# Patient Record
Sex: Female | Born: 1955
Health system: Southern US, Community
[De-identification: ages and names within clinical notes are randomized; demographics above are authoritative.]

## PROBLEM LIST (undated history)

## (undated) DIAGNOSIS — C519 Malignant neoplasm of vulva, unspecified: Secondary | ICD-10-CM

## (undated) DIAGNOSIS — I2111 ST elevation (STEMI) myocardial infarction involving right coronary artery: Secondary | ICD-10-CM

## (undated) DIAGNOSIS — Z720 Tobacco use: Secondary | ICD-10-CM

## (undated) DIAGNOSIS — I2510 Atherosclerotic heart disease of native coronary artery without angina pectoris: Secondary | ICD-10-CM

## (undated) DIAGNOSIS — E119 Type 2 diabetes mellitus without complications: Secondary | ICD-10-CM

## (undated) DIAGNOSIS — I472 Ventricular tachycardia: Secondary | ICD-10-CM

## (undated) DIAGNOSIS — I4721 Torsades de pointes: Secondary | ICD-10-CM

## (undated) DIAGNOSIS — E782 Mixed hyperlipidemia: Secondary | ICD-10-CM

## (undated) DIAGNOSIS — I255 Ischemic cardiomyopathy: Secondary | ICD-10-CM

## (undated) HISTORY — DX: Type 2 diabetes mellitus without complications: E11.9

## (undated) HISTORY — PX: WISDOM TOOTH EXTRACTION: SHX21

---

## 1979-11-11 HISTORY — PX: TONSILLECTOMY: SUR1361

## 1984-11-10 HISTORY — PX: LAMINECTOMY: SHX219

## 2002-10-12 ENCOUNTER — Other Ambulatory Visit: Admission: RE | Admit: 2002-10-12 | Discharge: 2002-10-12 | Payer: Self-pay | Admitting: Internal Medicine

## 2008-01-21 ENCOUNTER — Ambulatory Visit: Payer: Self-pay | Admitting: Internal Medicine

## 2014-11-10 HISTORY — PX: VULVECTOMY: SHX1086

## 2015-06-14 ENCOUNTER — Encounter: Payer: Self-pay | Admitting: Family Medicine

## 2015-06-14 ENCOUNTER — Other Ambulatory Visit (HOSPITAL_COMMUNITY)
Admission: RE | Admit: 2015-06-14 | Discharge: 2015-06-14 | Disposition: A | Discharge status: Home / Self Care | Payer: Commercial Managed Care - PPO | Source: Ambulatory Visit | Attending: Family Medicine | Admitting: Family Medicine

## 2015-06-14 ENCOUNTER — Encounter (INDEPENDENT_AMBULATORY_CARE_PROVIDER_SITE_OTHER): Payer: Self-pay

## 2015-06-14 ENCOUNTER — Ambulatory Visit (INDEPENDENT_AMBULATORY_CARE_PROVIDER_SITE_OTHER): Payer: Commercial Managed Care - PPO | Admitting: Family Medicine

## 2015-06-14 VITALS — BP 106/64 | HR 82 | Temp 98.8°F | Ht 65.0 in | Wt 208.0 lb

## 2015-06-14 DIAGNOSIS — Z113 Encounter for screening for infections with a predominantly sexual mode of transmission: Secondary | ICD-10-CM | POA: Insufficient documentation

## 2015-06-14 DIAGNOSIS — Z1211 Encounter for screening for malignant neoplasm of colon: Secondary | ICD-10-CM | POA: Diagnosis not present

## 2015-06-14 DIAGNOSIS — E119 Type 2 diabetes mellitus without complications: Secondary | ICD-10-CM | POA: Diagnosis not present

## 2015-06-14 DIAGNOSIS — Z01419 Encounter for gynecological examination (general) (routine) without abnormal findings: Secondary | ICD-10-CM | POA: Diagnosis not present

## 2015-06-14 DIAGNOSIS — Z1239 Encounter for other screening for malignant neoplasm of breast: Secondary | ICD-10-CM | POA: Diagnosis not present

## 2015-06-14 DIAGNOSIS — Z1151 Encounter for screening for human papillomavirus (HPV): Secondary | ICD-10-CM | POA: Diagnosis present

## 2015-06-14 DIAGNOSIS — Z0000 Encounter for general adult medical examination without abnormal findings: Secondary | ICD-10-CM | POA: Diagnosis not present

## 2015-06-14 DIAGNOSIS — E118 Type 2 diabetes mellitus with unspecified complications: Secondary | ICD-10-CM | POA: Insufficient documentation

## 2015-06-14 LAB — CBC WITH DIFFERENTIAL/PLATELET
BASOS ABS: 0 10*3/uL (ref 0.0–0.1)
BASOS PCT: 0.5 % (ref 0.0–3.0)
EOS PCT: 0 % (ref 0.0–5.0)
Eosinophils Absolute: 0 10*3/uL (ref 0.0–0.7)
HCT: 45.4 % (ref 36.0–46.0)
Hemoglobin: 15.4 g/dL — ABNORMAL HIGH (ref 12.0–15.0)
LYMPHS PCT: 22.4 % (ref 12.0–46.0)
Lymphs Abs: 1.6 10*3/uL (ref 0.7–4.0)
MCHC: 33.8 g/dL (ref 30.0–36.0)
MCV: 89.3 fl (ref 78.0–100.0)
MONO ABS: 0.5 10*3/uL (ref 0.1–1.0)
MONOS PCT: 6.7 % (ref 3.0–12.0)
NEUTROS PCT: 70.4 % (ref 43.0–77.0)
Neutro Abs: 5.1 10*3/uL (ref 1.4–7.7)
Platelets: 203 10*3/uL (ref 150.0–400.0)
RBC: 5.08 Mil/uL (ref 3.87–5.11)
RDW: 13.8 % (ref 11.5–15.5)
WBC: 7.3 10*3/uL (ref 4.0–10.5)

## 2015-06-14 LAB — COMPREHENSIVE METABOLIC PANEL
ALBUMIN: 4.1 g/dL (ref 3.5–5.2)
ALT: 12 U/L (ref 0–35)
AST: 13 U/L (ref 0–37)
Alkaline Phosphatase: 75 U/L (ref 39–117)
BUN: 16 mg/dL (ref 6–23)
CO2: 31 meq/L (ref 19–32)
Calcium: 9.4 mg/dL (ref 8.4–10.5)
Chloride: 101 mEq/L (ref 96–112)
Creatinine, Ser: 0.71 mg/dL (ref 0.40–1.20)
GFR: 89.81 mL/min (ref >60.00)
GLUCOSE: 104 mg/dL — AB (ref 70–99)
Potassium: 4 mEq/L (ref 3.5–5.1)
Sodium: 137 mEq/L (ref 135–145)
TOTAL PROTEIN: 7 g/dL (ref 6.0–8.3)
Total Bilirubin: 0.8 mg/dL (ref 0.2–1.2)

## 2015-06-14 LAB — HEMOGLOBIN A1C: Hgb A1c MFr Bld: 6.3 % (ref 4.6–6.5)

## 2015-06-14 LAB — LIPID PANEL
Cholesterol: 207 mg/dL — ABNORMAL HIGH (ref 0–200)
HDL: 57.8 mg/dL (ref >39.00)
LDL CALC: 138 mg/dL — AB (ref 0–99)
NonHDL: 149.05
Total CHOL/HDL Ratio: 4
Triglycerides: 54 mg/dL (ref 0.0–149.0)
VLDL: 10.8 mg/dL (ref 0.0–40.0)

## 2015-06-14 LAB — TSH: TSH: 1.3 u[IU]/mL (ref 0.35–4.50)

## 2015-06-14 NOTE — Addendum Note (Signed)
Addended by: Ellamae Sia on: 06/14/2015 12:14 PM   Modules accepted: Orders

## 2015-06-14 NOTE — Addendum Note (Signed)
Addended by: Modena Nunnery on: 06/14/2015 11:38 AM   Modules accepted: Orders

## 2015-06-14 NOTE — Patient Instructions (Signed)
Great to see you. Please come back for you pap smear.  I will call you with your results.  Call to set up your mammogram.

## 2015-06-14 NOTE — Assessment & Plan Note (Signed)
Per pt, has been diet controlled. Check a1c today to assess control.

## 2015-06-14 NOTE — Progress Notes (Signed)
Pre visit review using our clinic review tool, if applicable. No additional management support is needed unless otherwise documented below in the visit note. 

## 2015-06-14 NOTE — Assessment & Plan Note (Signed)
Reviewed preventive care protocols, scheduled due services, and updated immunizations Discussed nutrition, exercise, diet, and healthy lifestyle.  Pap smear today.  Mammogram ordered.  Pt to call to schedule- phone number given to her today.  Declines colonoscopy, agrees to IFOB.  Orders Placed This Encounter  Procedures  . Fecal occult blood, imunochemical  . MM Digital Screening  . CBC with Differential/Platelet  . Comprehensive metabolic panel  . Lipid panel  . TSH  . Hemoglobin A1c

## 2015-06-14 NOTE — Progress Notes (Signed)
Subjective:   Patient ID: Karen Hartman, female    DOB: 1956/09/09, 59 y.o.   MRN: [id number]  Karen Hartman is a pleasant 59 y.o. year old female who presents to clinic today with Establish Care  on 06/14/2015  HPI:  G2 P1 here to establish care and for CPX. Last pap smear was 12 years ago.  No h/o postmenopausal bleeding. Never had a colonoscopy.  Has not any blood in her stool or changes in her bowel habits.  Last mammogram was "a couple of years ago."  Diabetes- diet controlled after losing 75 pounds with diet.  Strong FH of diabetes.  No current outpatient prescriptions on file prior to visit.   No current facility-administered medications on file prior to visit.    No Known Allergies  Past Medical History  Diagnosis Date  . Diabetes     Past Surgical History  Procedure Laterality Date  . Tonsillectomy  1981  . Cesarean section    . Laminectomy  1986  . Wisdom tooth extraction      Family History  Problem Relation Age of Onset  . Uterine cancer Mother   . Heart disease Mother     History   Social History  . Marital Status: Married    Spouse Name: N/A  . Number of Children: N/A  . Years of Education: N/A   Occupational History  . Not on file.   Social History Main Topics  . Smoking status: Current Every Day Smoker  . Smokeless tobacco: Never Used  . Alcohol Use: No  . Drug Use: No  . Sexual Activity: Yes   Other Topics Concern  . Not on file   Social History Narrative  . No narrative on file   The PMH, PSH, Social History, Family History, Medications, and allergies have been reviewed in [facility], and have been updated if relevant.   Review of Systems  Constitutional: Negative.   HENT: Negative.   Eyes: Negative.   Respiratory: Negative.   Gastrointestinal: Negative.   Endocrine: Negative.   Genitourinary: Negative.   Musculoskeletal: Negative.   Skin: Negative.   Allergic/Immunologic: Negative.   Neurological: Negative.     Hematological: Negative.   Psychiatric/Behavioral: Negative.   All other systems reviewed and are negative.      Objective:    BP 106/64 mmHg  Pulse 82  Temp(Src) 98.8 F (37.1 C) (Oral)  Ht 5\' 5"  (1.651 m)  Wt 208 lb (94.348 kg)  BMI 34.61 kg/m2  SpO2 95%   Physical Exam    General:  Well-developed,well-nourished,in no acute distress; alert,appropriate and cooperative throughout examination Head:  normocephalic and atraumatic.   Eyes:  vision grossly intact, pupils equal, pupils round, and pupils reactive to light.   Ears:  R ear normal and L ear normal.   Nose:  no external deformity.   Mouth:  good dentition.   Neck:  No deformities, masses, or tenderness noted. Breasts:  No mass, nodules, thickening, tenderness, bulging, retraction, inflamation, nipple discharge or skin changes noted.   Lungs:  Normal respiratory effort, chest expands symmetrically. Lungs are clear to auscultation, no crackles or wheezes. Heart:  Normal rate and regular rhythm. S1 and S2 normal without gallop, murmur, click, rub or other extra sounds. Abdomen:  Bowel sounds positive,abdomen soft and non-tender without masses, organomegaly or hernias noted. Rectal:  no external abnormalities.   Genitalia:  Pelvic Exam:        External: normal female genitalia without lesions or masses  Vagina: normal without lesions or masses        Cervix: normal without lesions or masses        Adnexa: normal bimanual exam without masses or fullness        Uterus: normal by palpation        Pap smear: performed Msk:  No deformity or scoliosis noted of thoracic or lumbar spine.   Extremities:  No clubbing, cyanosis, edema, or deformity noted with normal full range of motion of all joints.   Neurologic:  alert & oriented X3 and gait normal.   Skin:  Intact without suspicious lesions or rashes Cervical Nodes:  No lymphadenopathy noted Axillary Nodes:  No palpable lymphadenopathy Psych:  Cognition and judgment  appear intact. Alert and cooperative with normal attention span and concentration. No apparent delusions, illusions, hallucinations        Assessment & Plan:   Well woman exam - Plan: CBC with Differential/Platelet, Comprehensive metabolic panel, Lipid panel, TSH  Type 2 diabetes mellitus without complication - Plan: Hemoglobin A1c  Special screening for malignant neoplasms, colon - Plan: Fecal occult blood, imunochemical  Screening for breast cancer - Plan: MM Digital Screening No Follow-up on file.

## 2015-06-15 ENCOUNTER — Encounter: Payer: Self-pay | Admitting: *Deleted

## 2015-06-15 LAB — CYTOLOGY - PAP

## 2015-06-17 LAB — CERVICOVAGINAL ANCILLARY ONLY
Bacterial vaginitis: NEGATIVE
Candida vaginitis: NEGATIVE

## 2015-06-18 LAB — CERVICOVAGINAL ANCILLARY ONLY: Herpes: NEGATIVE

## 2015-10-09 DIAGNOSIS — C519 Malignant neoplasm of vulva, unspecified: Secondary | ICD-10-CM | POA: Insufficient documentation

## 2015-10-09 DIAGNOSIS — Z8544 Personal history of malignant neoplasm of other female genital organs: Secondary | ICD-10-CM | POA: Insufficient documentation

## 2015-11-09 DIAGNOSIS — T8149XA Infection following a procedure, other surgical site, initial encounter: Secondary | ICD-10-CM | POA: Insufficient documentation

## 2016-11-01 ENCOUNTER — Encounter (HOSPITAL_COMMUNITY)
Admission: EM | Disposition: A | Discharge status: Home / Self Care | Payer: Self-pay | Source: Home / Self Care | Attending: Cardiovascular Disease

## 2016-11-01 ENCOUNTER — Emergency Department (HOSPITAL_COMMUNITY): Payer: Managed Care, Other (non HMO)

## 2016-11-01 ENCOUNTER — Encounter (HOSPITAL_COMMUNITY): Payer: Self-pay

## 2016-11-01 ENCOUNTER — Inpatient Hospital Stay (HOSPITAL_COMMUNITY)
Admission: EM | Admit: 2016-11-01 | Discharge: 2016-11-05 | DRG: 246 | Disposition: A | Discharge status: Home / Self Care | Payer: Managed Care, Other (non HMO) | Attending: Cardiovascular Disease | Admitting: Cardiovascular Disease

## 2016-11-01 DIAGNOSIS — I4721 Torsades de pointes: Secondary | ICD-10-CM

## 2016-11-01 DIAGNOSIS — I25118 Atherosclerotic heart disease of native coronary artery with other forms of angina pectoris: Secondary | ICD-10-CM

## 2016-11-01 DIAGNOSIS — I2510 Atherosclerotic heart disease of native coronary artery without angina pectoris: Secondary | ICD-10-CM | POA: Diagnosis present

## 2016-11-01 DIAGNOSIS — I5021 Acute systolic (congestive) heart failure: Secondary | ICD-10-CM | POA: Diagnosis present

## 2016-11-01 DIAGNOSIS — F17210 Nicotine dependence, cigarettes, uncomplicated: Secondary | ICD-10-CM | POA: Diagnosis present

## 2016-11-01 DIAGNOSIS — Y831 Surgical operation with implant of artificial internal device as the cause of abnormal reaction of the patient, or of later complication, without mention of misadventure at the time of the procedure: Secondary | ICD-10-CM | POA: Diagnosis not present

## 2016-11-01 DIAGNOSIS — I255 Ischemic cardiomyopathy: Secondary | ICD-10-CM | POA: Diagnosis present

## 2016-11-01 DIAGNOSIS — I472 Ventricular tachycardia: Secondary | ICD-10-CM | POA: Diagnosis present

## 2016-11-01 DIAGNOSIS — Z8249 Family history of ischemic heart disease and other diseases of the circulatory system: Secondary | ICD-10-CM

## 2016-11-01 DIAGNOSIS — Z6834 Body mass index (BMI) 34.0-34.9, adult: Secondary | ICD-10-CM

## 2016-11-01 DIAGNOSIS — Z6841 Body Mass Index (BMI) 40.0 and over, adult: Secondary | ICD-10-CM

## 2016-11-01 DIAGNOSIS — Z981 Arthrodesis status: Secondary | ICD-10-CM

## 2016-11-01 DIAGNOSIS — I213 ST elevation (STEMI) myocardial infarction of unspecified site: Secondary | ICD-10-CM | POA: Diagnosis present

## 2016-11-01 DIAGNOSIS — R0789 Other chest pain: Secondary | ICD-10-CM | POA: Diagnosis present

## 2016-11-01 DIAGNOSIS — I2111 ST elevation (STEMI) myocardial infarction involving right coronary artery: Secondary | ICD-10-CM | POA: Diagnosis present

## 2016-11-01 DIAGNOSIS — I110 Hypertensive heart disease with heart failure: Secondary | ICD-10-CM | POA: Diagnosis present

## 2016-11-01 DIAGNOSIS — Z8544 Personal history of malignant neoplasm of other female genital organs: Secondary | ICD-10-CM | POA: Diagnosis not present

## 2016-11-01 DIAGNOSIS — Z8049 Family history of malignant neoplasm of other genital organs: Secondary | ICD-10-CM | POA: Diagnosis not present

## 2016-11-01 DIAGNOSIS — Z452 Encounter for adjustment and management of vascular access device: Secondary | ICD-10-CM

## 2016-11-01 DIAGNOSIS — I493 Ventricular premature depolarization: Secondary | ICD-10-CM | POA: Diagnosis present

## 2016-11-01 DIAGNOSIS — Z955 Presence of coronary angioplasty implant and graft: Secondary | ICD-10-CM

## 2016-11-01 DIAGNOSIS — T82867A Thrombosis of cardiac prosthetic devices, implants and grafts, initial encounter: Secondary | ICD-10-CM | POA: Diagnosis not present

## 2016-11-01 DIAGNOSIS — I4729 Other ventricular tachycardia: Secondary | ICD-10-CM

## 2016-11-01 DIAGNOSIS — I10 Essential (primary) hypertension: Secondary | ICD-10-CM | POA: Diagnosis not present

## 2016-11-01 DIAGNOSIS — E782 Mixed hyperlipidemia: Secondary | ICD-10-CM | POA: Diagnosis present

## 2016-11-01 DIAGNOSIS — E119 Type 2 diabetes mellitus without complications: Secondary | ICD-10-CM | POA: Diagnosis present

## 2016-11-01 DIAGNOSIS — Y92230 Patient room in hospital as the place of occurrence of the external cause: Secondary | ICD-10-CM | POA: Diagnosis not present

## 2016-11-01 DIAGNOSIS — I340 Nonrheumatic mitral (valve) insufficiency: Secondary | ICD-10-CM | POA: Diagnosis present

## 2016-11-01 DIAGNOSIS — E118 Type 2 diabetes mellitus with unspecified complications: Secondary | ICD-10-CM

## 2016-11-01 HISTORY — PX: TEMPORARY PACEMAKER: CATH118268

## 2016-11-01 HISTORY — DX: Ischemic cardiomyopathy: I25.5

## 2016-11-01 HISTORY — DX: Ventricular tachycardia: I47.2

## 2016-11-01 HISTORY — DX: ST elevation (STEMI) myocardial infarction involving right coronary artery: I21.11

## 2016-11-01 HISTORY — PX: CARDIAC CATHETERIZATION: SHX172

## 2016-11-01 HISTORY — DX: Morbid (severe) obesity due to excess calories: E66.01

## 2016-11-01 HISTORY — DX: Torsades de pointes: I47.21

## 2016-11-01 HISTORY — DX: Mixed hyperlipidemia: E78.2

## 2016-11-01 HISTORY — DX: Malignant neoplasm of vulva, unspecified: C51.9

## 2016-11-01 HISTORY — PX: INTRAVASCULAR ULTRASOUND/IVUS: CATH118244

## 2016-11-01 HISTORY — DX: ST elevation (STEMI) myocardial infarction of unspecified site: I21.3

## 2016-11-01 HISTORY — DX: Tobacco use: Z72.0

## 2016-11-01 HISTORY — PX: LEFT HEART CATH AND CORONARY ANGIOGRAPHY: CATH118249

## 2016-11-01 HISTORY — PX: CORONARY THROMBECTOMY: CATH118304

## 2016-11-01 HISTORY — DX: Atherosclerotic heart disease of native coronary artery without angina pectoris: I25.10

## 2016-11-01 LAB — COMPREHENSIVE METABOLIC PANEL
ALT: UNDETERMINED U/L (ref 14–54)
AST: 31 U/L (ref 15–41)
Albumin: 4.1 g/dL (ref 3.5–5.0)
Alkaline Phosphatase: 69 U/L (ref 38–126)
Anion gap: 16 — ABNORMAL HIGH (ref 5–15)
BILIRUBIN TOTAL: UNDETERMINED mg/dL (ref 0.3–1.2)
BUN: 16 mg/dL (ref 6–20)
CO2: 21 mmol/L — ABNORMAL LOW (ref 22–32)
CREATININE: 0.86 mg/dL (ref 0.44–1.00)
Calcium: 10.4 mg/dL — ABNORMAL HIGH (ref 8.9–10.3)
Chloride: 102 mmol/L (ref 101–111)
GFR calc Af Amer: >60 mL/min (ref >60)
GLUCOSE: 228 mg/dL — AB (ref 65–99)
Potassium: 4.4 mmol/L (ref 3.5–5.1)
Sodium: 139 mmol/L (ref 135–145)
TOTAL PROTEIN: 6.9 g/dL (ref 6.5–8.1)

## 2016-11-01 LAB — PROTIME-INR
INR: 0.93
PROTHROMBIN TIME: 12.4 s (ref 11.4–15.2)

## 2016-11-01 LAB — LIPID PANEL
Cholesterol: 236 mg/dL — ABNORMAL HIGH (ref 0–200)
HDL: 56 mg/dL (ref >40)
LDL CALC: 160 mg/dL — AB (ref 0–99)
TRIGLYCERIDES: 98 mg/dL (ref <150)
Total CHOL/HDL Ratio: 4.2 RATIO
VLDL: 20 mg/dL (ref 0–40)

## 2016-11-01 LAB — DIFFERENTIAL
BASOS ABS: 0 10*3/uL (ref 0.0–0.1)
Basophils Relative: 0 %
EOS ABS: 0 10*3/uL (ref 0.0–0.7)
Eosinophils Relative: 0 %
LYMPHS ABS: 1.8 10*3/uL (ref 0.7–4.0)
LYMPHS PCT: 30 %
Monocytes Absolute: 0.3 10*3/uL (ref 0.1–1.0)
Monocytes Relative: 5 %
NEUTROS PCT: 65 %
Neutro Abs: 3.8 10*3/uL (ref 1.7–7.7)

## 2016-11-01 LAB — CBC
HCT: 46.1 % — ABNORMAL HIGH (ref 36.0–46.0)
HEMOGLOBIN: 16.1 g/dL — AB (ref 12.0–15.0)
MCH: 30.7 pg (ref 26.0–34.0)
MCHC: 34.9 g/dL (ref 30.0–36.0)
MCV: 88 fL (ref 78.0–100.0)
PLATELETS: 230 10*3/uL (ref 150–400)
RBC: 5.24 MIL/uL — AB (ref 3.87–5.11)
RDW: 12.5 % (ref 11.5–15.5)
WBC: 5.9 10*3/uL (ref 4.0–10.5)

## 2016-11-01 LAB — BRAIN NATRIURETIC PEPTIDE: B Natriuretic Peptide: 92 pg/mL (ref 0.0–100.0)

## 2016-11-01 LAB — TSH: TSH: 2.608 u[IU]/mL (ref 0.350–4.500)

## 2016-11-01 LAB — GLUCOSE, CAPILLARY
GLUCOSE-CAPILLARY: 196 mg/dL — AB (ref 65–99)
GLUCOSE-CAPILLARY: 171 mg/dL — AB (ref 65–99)

## 2016-11-01 LAB — APTT: APTT: 23 s — AB (ref 24–36)

## 2016-11-01 LAB — TROPONIN I
TROPONIN I: 0.94 ng/mL — AB (ref <0.03)
Troponin I: 20.98 ng/mL (ref <0.03)
Troponin I: 29.65 ng/mL (ref <0.03)

## 2016-11-01 LAB — MRSA PCR SCREENING: MRSA by PCR: NEGATIVE

## 2016-11-01 SURGERY — LEFT HEART CATH AND CORONARY ANGIOGRAPHY
Anesthesia: LOCAL

## 2016-11-01 SURGERY — LEFT HEART CATH AND CORONARY ANGIOGRAPHY

## 2016-11-01 MED ORDER — LABETALOL HCL 5 MG/ML IV SOLN: 10.0000 mg | INTRAVENOUS | Status: AC | PRN

## 2016-11-01 MED ORDER — TICAGRELOR 90 MG PO TABS
ORAL_TABLET | ORAL | Status: AC
  Filled 2016-11-01: qty 1

## 2016-11-01 MED ORDER — LISINOPRIL 2.5 MG PO TABS
2.5000 mg | ORAL_TABLET | Freq: Every day | ORAL | Status: DC
  Administered 2016-11-01 – 2016-11-02 (×2): 2.5 mg via ORAL
  Filled 2016-11-01 (×2): qty 1

## 2016-11-01 MED ORDER — HYDRALAZINE HCL 20 MG/ML IJ SOLN: 5.0000 mg | INTRAMUSCULAR | Status: AC | PRN

## 2016-11-01 MED ORDER — HEPARIN (PORCINE) IN NACL 2-0.9 UNIT/ML-% IJ SOLN
INTRAMUSCULAR | Status: DC | PRN
  Administered 2016-11-01: 1500 mL via INTRA_ARTERIAL

## 2016-11-01 MED ORDER — MIDAZOLAM HCL 2 MG/2ML IJ SOLN
INTRAMUSCULAR | Status: DC | PRN
  Administered 2016-11-01: 1 mg via INTRAVENOUS

## 2016-11-01 MED ORDER — ONDANSETRON HCL 4 MG/2ML IJ SOLN
4.0000 mg | Freq: Once | INTRAMUSCULAR | Status: AC
  Administered 2016-11-01: 4 mg via INTRAVENOUS
  Filled 2016-11-01: qty 2

## 2016-11-01 MED ORDER — TIROFIBAN (AGGRASTAT) BOLUS VIA INFUSION
25.0000 ug/kg | Freq: Once | INTRAVENOUS | Status: AC
  Administered 2016-11-01: 2495 ug via INTRAVENOUS
  Filled 2016-11-01: qty 50

## 2016-11-01 MED ORDER — DIAZEPAM 5 MG PO TABS: 5.0000 mg | ORAL_TABLET | Freq: Four times a day (QID) | ORAL | Status: DC | PRN

## 2016-11-01 MED ORDER — OXYCODONE-ACETAMINOPHEN 5-325 MG PO TABS: 1.0000 | ORAL_TABLET | ORAL | Status: DC | PRN

## 2016-11-01 MED ORDER — MORPHINE SULFATE (PF) 4 MG/ML IV SOLN
4.0000 mg | Freq: Once | INTRAVENOUS | Status: AC
  Administered 2016-11-01: 4 mg via INTRAVENOUS
  Filled 2016-11-01: qty 1

## 2016-11-01 MED ORDER — PROMETHAZINE HCL 25 MG/ML IJ SOLN
INTRAMUSCULAR | Status: DC | PRN
Start: 2016-11-01 — End: 2016-11-01
  Administered 2016-11-01: 12.5 mg via INTRAVENOUS

## 2016-11-01 MED ORDER — SODIUM CHLORIDE 0.9 % IV SOLN
INTRAVENOUS | Status: DC | PRN
  Administered 2016-11-01: 100 mL/h via INTRAVENOUS
  Administered 2016-11-01: 250 mL via INTRAVENOUS

## 2016-11-01 MED ORDER — TIROFIBAN HCL IN NACL 5-0.9 MG/100ML-% IV SOLN
0.1500 ug/kg/min | INTRAVENOUS | Status: AC
  Administered 2016-11-01 – 2016-11-02 (×2): 0.15 ug/kg/min via INTRAVENOUS
  Filled 2016-11-01: qty 100

## 2016-11-01 MED ORDER — BIVALIRUDIN BOLUS VIA INFUSION - CUPID
INTRAVENOUS | Status: DC | PRN
  Administered 2016-11-01: 74.85 mg via INTRAVENOUS

## 2016-11-01 MED ORDER — ASPIRIN EC 81 MG PO TBEC
81.0000 mg | DELAYED_RELEASE_TABLET | Freq: Every day | ORAL | Status: DC
Start: 2016-11-02 — End: 2016-11-05
  Administered 2016-11-02 – 2016-11-05 (×4): 81 mg via ORAL
  Filled 2016-11-01 (×4): qty 1

## 2016-11-01 MED ORDER — SODIUM CHLORIDE 0.9% FLUSH
3.0000 mL | Freq: Two times a day (BID) | INTRAVENOUS | Status: DC
  Administered 2016-11-01 – 2016-11-04 (×4): 3 mL via INTRAVENOUS
  Administered 2016-11-04: 10 mL via INTRAVENOUS

## 2016-11-01 MED ORDER — IOPAMIDOL (ISOVUE-370) INJECTION 76%
INTRAVENOUS | Status: DC | PRN
  Administered 2016-11-01: 110 mL via INTRA_ARTERIAL

## 2016-11-01 MED ORDER — MIDAZOLAM HCL 2 MG/2ML IJ SOLN
INTRAMUSCULAR | Status: AC
  Filled 2016-11-01: qty 2

## 2016-11-01 MED ORDER — FENTANYL CITRATE (PF) 100 MCG/2ML IJ SOLN
INTRAMUSCULAR | Status: DC | PRN
  Administered 2016-11-01: 25 ug via INTRAVENOUS

## 2016-11-01 MED ORDER — LIDOCAINE HCL (PF) 1 % IJ SOLN
INTRAMUSCULAR | Status: AC
  Filled 2016-11-01: qty 30

## 2016-11-01 MED ORDER — ATROPINE SULFATE 1 MG/10ML IJ SOSY
PREFILLED_SYRINGE | INTRAMUSCULAR | Status: AC
  Filled 2016-11-01: qty 10

## 2016-11-01 MED ORDER — ASPIRIN 81 MG PO CHEW
324.0000 mg | CHEWABLE_TABLET | Freq: Once | ORAL | Status: AC
  Administered 2016-11-01: 324 mg via ORAL

## 2016-11-01 MED ORDER — LIDOCAINE HCL (PF) 1 % IJ SOLN
INTRAMUSCULAR | Status: DC | PRN
  Administered 2016-11-01: 15 mL via INTRADERMAL

## 2016-11-01 MED ORDER — SODIUM CHLORIDE 0.9 % IV SOLN
10.0000 mL/h | INTRAVENOUS | Status: DC
  Administered 2016-11-01: 20 mL/h via INTRAVENOUS

## 2016-11-01 MED ORDER — LIDOCAINE-EPINEPHRINE 1 %-1:100000 IJ SOLN
INTRAMUSCULAR | Status: AC
  Filled 2016-11-01: qty 1

## 2016-11-01 MED ORDER — INSULIN ASPART 100 UNIT/ML ~~LOC~~ SOLN
0.0000 [IU] | Freq: Three times a day (TID) | SUBCUTANEOUS | Status: DC
  Administered 2016-11-02: 2 [IU] via SUBCUTANEOUS
  Administered 2016-11-02: 3 [IU] via SUBCUTANEOUS
  Administered 2016-11-03: 2 [IU] via SUBCUTANEOUS
  Administered 2016-11-03 (×2): 3 [IU] via SUBCUTANEOUS
  Administered 2016-11-04: 2 [IU] via SUBCUTANEOUS
  Administered 2016-11-04 – 2016-11-05 (×2): 3 [IU] via SUBCUTANEOUS
  Administered 2016-11-05: 2 [IU] via SUBCUTANEOUS

## 2016-11-01 MED ORDER — ALUM & MAG HYDROXIDE-SIMETH 200-200-20 MG/5ML PO SUSP
30.0000 mL | ORAL | Status: DC | PRN
  Administered 2016-11-01: 30 mL via ORAL
  Filled 2016-11-01: qty 30

## 2016-11-01 MED ORDER — IOPAMIDOL (ISOVUE-370) INJECTION 76%
INTRAVENOUS | Status: DC | PRN
  Administered 2016-11-01: 85 mL via INTRA_ARTERIAL

## 2016-11-01 MED ORDER — ONDANSETRON HCL 4 MG/2ML IJ SOLN
INTRAMUSCULAR | Status: AC
  Filled 2016-11-01: qty 2

## 2016-11-01 MED ORDER — ACETAMINOPHEN 325 MG PO TABS: 650.0000 mg | ORAL_TABLET | ORAL | Status: DC | PRN

## 2016-11-01 MED ORDER — ONDANSETRON HCL 4 MG/2ML IJ SOLN: 4.0000 mg | Freq: Four times a day (QID) | INTRAMUSCULAR | Status: DC | PRN

## 2016-11-01 MED ORDER — NITROGLYCERIN 1 MG/10 ML FOR IR/CATH LAB
INTRA_ARTERIAL | Status: DC | PRN
  Administered 2016-11-01 (×2): 150 ug via INTRACORONARY
  Administered 2016-11-01: 200 ug via INTRACORONARY

## 2016-11-01 MED ORDER — NITROGLYCERIN 0.4 MG SL SUBL
SUBLINGUAL_TABLET | SUBLINGUAL | Status: AC
  Filled 2016-11-01: qty 1

## 2016-11-01 MED ORDER — HEPARIN (PORCINE) IN NACL 2-0.9 UNIT/ML-% IJ SOLN
INTRAMUSCULAR | Status: AC
  Filled 2016-11-01: qty 1500

## 2016-11-01 MED ORDER — NITROGLYCERIN 0.4 MG SL SUBL: 0.4000 mg | SUBLINGUAL_TABLET | SUBLINGUAL | Status: DC | PRN

## 2016-11-01 MED ORDER — HEPARIN SODIUM (PORCINE) 1000 UNIT/ML IJ SOLN
INTRAMUSCULAR | Status: DC | PRN
  Administered 2016-11-01: 5000 [IU] via INTRAVENOUS

## 2016-11-01 MED ORDER — TICAGRELOR 90 MG PO TABS
ORAL_TABLET | ORAL | Status: DC | PRN
  Administered 2016-11-01: 180 mg via ORAL

## 2016-11-01 MED ORDER — TIROFIBAN HCL IN NACL 5-0.9 MG/100ML-% IV SOLN
INTRAVENOUS | Status: AC
  Filled 2016-11-01: qty 100

## 2016-11-01 MED ORDER — SODIUM CHLORIDE 0.9 % IV SOLN
INTRAVENOUS | Status: DC | PRN
  Administered 2016-11-01: 1.75 mg/kg/h via INTRAVENOUS

## 2016-11-01 MED ORDER — TICAGRELOR 90 MG PO TABS
90.0000 mg | ORAL_TABLET | Freq: Two times a day (BID) | ORAL | Status: DC
  Administered 2016-11-01 – 2016-11-05 (×8): 90 mg via ORAL
  Filled 2016-11-01 (×8): qty 1

## 2016-11-01 MED ORDER — PROMETHAZINE HCL 25 MG/ML IJ SOLN
INTRAMUSCULAR | Status: AC
  Filled 2016-11-01: qty 1

## 2016-11-01 MED ORDER — SODIUM CHLORIDE 0.9 % IV SOLN: INTRAVENOUS | Status: DC | PRN

## 2016-11-01 MED ORDER — NITROGLYCERIN 1 MG/10 ML FOR IR/CATH LAB
INTRA_ARTERIAL | Status: AC
  Filled 2016-11-01: qty 10

## 2016-11-01 MED ORDER — MORPHINE SULFATE (PF) 4 MG/ML IV SOLN
4.0000 mg | INTRAVENOUS | Status: DC | PRN
  Administered 2016-11-01: 4 mg via INTRAVENOUS
  Filled 2016-11-01: qty 1

## 2016-11-01 MED ORDER — ATORVASTATIN CALCIUM 80 MG PO TABS
80.0000 mg | ORAL_TABLET | Freq: Every day | ORAL | Status: DC
  Administered 2016-11-01 – 2016-11-05 (×5): 80 mg via ORAL
  Filled 2016-11-01 (×5): qty 1

## 2016-11-01 MED ORDER — PROMETHAZINE HCL 25 MG/ML IJ SOLN: 12.5000 mg | Freq: Four times a day (QID) | INTRAMUSCULAR | Status: DC | PRN

## 2016-11-01 MED ORDER — LIDOCAINE-EPINEPHRINE 1 %-1:100000 IJ SOLN
INTRAMUSCULAR | Status: DC | PRN
  Administered 2016-11-01: 5 mL via INTRADERMAL

## 2016-11-01 MED ORDER — HEPARIN (PORCINE) IN NACL 2-0.9 UNIT/ML-% IJ SOLN
INTRAMUSCULAR | Status: DC | PRN
  Administered 2016-11-01: 1500 mL

## 2016-11-01 MED ORDER — HEPARIN SODIUM (PORCINE) 1000 UNIT/ML IJ SOLN
INTRAMUSCULAR | Status: AC
  Filled 2016-11-01: qty 1

## 2016-11-01 MED ORDER — IOPAMIDOL (ISOVUE-370) INJECTION 76%
INTRAVENOUS | Status: AC
  Filled 2016-11-01: qty 125

## 2016-11-01 MED ORDER — METOPROLOL TARTRATE 25 MG PO TABS
25.0000 mg | ORAL_TABLET | Freq: Two times a day (BID) | ORAL | Status: DC
  Administered 2016-11-01 – 2016-11-02 (×2): 25 mg via ORAL
  Filled 2016-11-01 (×3): qty 1

## 2016-11-01 MED ORDER — SODIUM CHLORIDE 0.9% FLUSH
3.0000 mL | Freq: Two times a day (BID) | INTRAVENOUS | Status: DC
  Administered 2016-11-01 – 2016-11-04 (×6): 3 mL via INTRAVENOUS
  Administered 2016-11-04: 10 mL via INTRAVENOUS

## 2016-11-01 MED ORDER — MIDAZOLAM HCL 2 MG/2ML IJ SOLN
INTRAMUSCULAR | Status: DC | PRN
  Administered 2016-11-01: 2 mg via INTRAVENOUS

## 2016-11-01 MED ORDER — VERAPAMIL HCL 2.5 MG/ML IV SOLN
INTRAVENOUS | Status: AC
  Filled 2016-11-01: qty 2

## 2016-11-01 MED ORDER — SODIUM CHLORIDE 0.9 % IV SOLN: 250.0000 mL | INTRAVENOUS | Status: DC | PRN

## 2016-11-01 MED ORDER — ATROPINE SULFATE 1 MG/10ML IJ SOSY
PREFILLED_SYRINGE | INTRAMUSCULAR | Status: DC | PRN
  Administered 2016-11-01: 0.5 mg via INTRAVENOUS

## 2016-11-01 MED ORDER — FENTANYL CITRATE (PF) 100 MCG/2ML IJ SOLN
INTRAMUSCULAR | Status: AC
  Filled 2016-11-01: qty 2

## 2016-11-01 MED ORDER — LIDOCAINE HCL (PF) 1 % IJ SOLN
INTRAMUSCULAR | Status: DC | PRN
  Administered 2016-11-01: 15 mL

## 2016-11-01 MED ORDER — TIROFIBAN HCL IN NACL 5-0.9 MG/100ML-% IV SOLN
0.1500 ug/kg/min | INTRAVENOUS | Status: AC
  Administered 2016-11-01 (×2): 0.15 ug/kg/min via INTRAVENOUS
  Filled 2016-11-01 (×3): qty 100

## 2016-11-01 MED ORDER — ONDANSETRON HCL 4 MG/2ML IJ SOLN
INTRAMUSCULAR | Status: DC | PRN
  Administered 2016-11-01: 4 mg via INTRAVENOUS

## 2016-11-01 MED ORDER — SODIUM CHLORIDE 0.9% FLUSH: 3.0000 mL | INTRAVENOUS | Status: DC | PRN

## 2016-11-01 MED ORDER — IOPAMIDOL (ISOVUE-370) INJECTION 76%
INTRAVENOUS | Status: AC
  Filled 2016-11-01: qty 100

## 2016-11-01 MED ORDER — HEPARIN SODIUM (PORCINE) 5000 UNIT/ML IJ SOLN
5000.0000 [IU] | Freq: Three times a day (TID) | INTRAMUSCULAR | Status: DC
  Administered 2016-11-01 – 2016-11-02 (×3): 5000 [IU] via SUBCUTANEOUS
  Filled 2016-11-01 (×4): qty 1

## 2016-11-01 MED ORDER — SODIUM CHLORIDE 0.9 % WEIGHT BASED INFUSION: 1.0000 mL/kg/h | INTRAVENOUS | Status: AC

## 2016-11-01 MED ORDER — ONDANSETRON HCL 4 MG/2ML IJ SOLN
4.0000 mg | Freq: Four times a day (QID) | INTRAMUSCULAR | Status: DC | PRN
  Administered 2016-11-01: 4 mg via INTRAVENOUS
  Filled 2016-11-01: qty 2

## 2016-11-01 MED ORDER — NITROGLYCERIN 1 MG/10 ML FOR IR/CATH LAB
INTRA_ARTERIAL | Status: DC | PRN
  Administered 2016-11-01: 100 ug

## 2016-11-01 MED ORDER — TICAGRELOR 90 MG PO TABS
ORAL_TABLET | ORAL | Status: AC
  Filled 2016-11-01: qty 2

## 2016-11-01 MED ORDER — HEPARIN SODIUM (PORCINE) 5000 UNIT/ML IJ SOLN
60.0000 [IU]/kg | INTRAMUSCULAR | Status: AC
  Administered 2016-11-01: 4000 [IU] via INTRAVENOUS

## 2016-11-01 MED ORDER — TIROFIBAN HCL IN NACL 5-0.9 MG/100ML-% IV SOLN: 0.1500 ug/kg/min | INTRAVENOUS | Status: DC

## 2016-11-01 MED ORDER — PROMETHAZINE HCL 25 MG/ML IJ SOLN: 12.5000 mg | Freq: Once | INTRAMUSCULAR | Status: AC

## 2016-11-01 MED ORDER — SODIUM CHLORIDE 0.9 % WEIGHT BASED INFUSION
1.0000 mL/kg/h | INTRAVENOUS | Status: AC
  Administered 2016-11-01: 1 mL/kg/h via INTRAVENOUS

## 2016-11-01 MED ORDER — BIVALIRUDIN 250 MG IV SOLR
INTRAVENOUS | Status: AC
  Filled 2016-11-01: qty 250

## 2016-11-01 MED ORDER — ASPIRIN 81 MG PO CHEW
CHEWABLE_TABLET | ORAL | Status: AC
  Administered 2016-11-01: 324 mg via ORAL
  Filled 2016-11-01: qty 4

## 2016-11-01 SURGICAL SUPPLY — 20 items
BALLN MOZEC 3.5X17 (BALLOONS) ×1 IMPLANT
BALLN ~~LOC~~ EUPHORA RX 3.75X12 (BALLOONS) ×2
BALLOON ~~LOC~~ EUPHORA RX 3.75X12 (BALLOONS) IMPLANT
CABLE ADAPT CONN TEMP 6FT (ADAPTER) ×1 IMPLANT
CATH ANGIOJET SPIRO ULTR 135CM (CATHETERS) ×1 IMPLANT
CATH EXTRAC PRONTO 5.5F 138CM (CATHETERS) ×1 IMPLANT
CATH OPTICROSS 40MHZ (CATHETERS) ×1 IMPLANT
CATH S G BIP PACING (SET/KITS/TRAYS/PACK) ×1 IMPLANT
CATH VISTA GUIDE 6FR JR4 (CATHETERS) ×1 IMPLANT
DEVICE CLOSURE PERCLS PRGLD 6F (VASCULAR PRODUCTS) IMPLANT
KIT ENCORE 26 ADVANTAGE (KITS) ×1 IMPLANT
KIT HEART LEFT (KITS) ×2 IMPLANT
PACK CARDIAC CATHETERIZATION (CUSTOM PROCEDURE TRAY) ×2 IMPLANT
PERCLOSE PROGLIDE 6F (VASCULAR PRODUCTS) ×2
SHEATH PINNACLE 6F 10CM (SHEATH) ×2 IMPLANT
SLED PULL BACK IVUS (MISCELLANEOUS) ×1 IMPLANT
TRANSDUCER W/STOPCOCK (MISCELLANEOUS) ×2 IMPLANT
TUBING CIL FLEX 10 FLL-RA (TUBING) ×2 IMPLANT
WIRE COUGAR XT STRL 190CM (WIRE) ×1 IMPLANT
WIRE EMERALD 3MM-J .035X150CM (WIRE) ×1 IMPLANT

## 2016-11-01 SURGICAL SUPPLY — 20 items
BALLN EMERGE MR 2.5X12 (BALLOONS) ×2
BALLOON EMERGE MR 2.5X12 (BALLOONS) IMPLANT
CATH INFINITI 5FR ANG PIGTAIL (CATHETERS) ×1 IMPLANT
CATH INFINITI 5FR JL4 (CATHETERS) ×1 IMPLANT
CATH VISTA GUIDE 6FR JR4 (CATHETERS) ×1 IMPLANT
DEVICE CLOSURE PERCLS PRGLD 6F (VASCULAR PRODUCTS) IMPLANT
GLIDESHEATH SLEND SS 6F .021 (SHEATH) IMPLANT
GUIDEWIRE INQWIRE 1.5J.035X260 (WIRE) IMPLANT
INQWIRE 1.5J .035X260CM (WIRE)
KIT ENCORE 26 ADVANTAGE (KITS) ×1 IMPLANT
KIT HEART LEFT (KITS) ×1 IMPLANT
PACK CARDIAC CATHETERIZATION (CUSTOM PROCEDURE TRAY) ×1 IMPLANT
PERCLOSE PROGLIDE 6F (VASCULAR PRODUCTS) ×2
SHEATH PINNACLE 6F 10CM (SHEATH) ×1 IMPLANT
STENT PROMUS PREM MR 3.5X28 (Permanent Stent) ×1 IMPLANT
SYR MEDRAD MARK V 150ML (SYRINGE) ×1 IMPLANT
TRANSDUCER W/STOPCOCK (MISCELLANEOUS) ×1 IMPLANT
TUBING CIL FLEX 10 FLL-RA (TUBING) ×1 IMPLANT
WIRE COUGAR XT STRL 190CM (WIRE) ×1 IMPLANT
WIRE EMERALD 3MM-J .035X150CM (WIRE) ×1 IMPLANT

## 2016-11-01 NOTE — ED Triage Notes (Signed)
Patient complains of central chest pain since yesterday evening. States that the pain has radiated to left arm with nausea and vomiting and frequent belching. Complains of increased pain since am. Alert and oriented, no shortness of breath

## 2016-11-01 NOTE — Progress Notes (Signed)
EKG CRITICAL VALUE     12 lead EKG performed.  Critical value noted Dr. Burt Knack  notified.   Delfin Edis, [place] 11/01/2016 10:33 AM

## 2016-11-01 NOTE — H&P (Signed)
History and Physical  Patient ID: SAYLEM BARGAS MRN: [id number], SOB: 1955/12/28 60 y.o. Date of Encounter: 11/01/2016, 8:35 AM  Primary Physician: Arnette Norris, MD Primary Cardiologist: new  Chief Complaint: Chest pain  HPI: 60 y.o. female w/ PMHx significant for diabetes, tobacco, and obesity who presented to [facility] on 11/01/2016 with complaints of chest pain.  The patient began having chest discomfort yesterday. She describes constant ongoing chest pain since midnight. Her pain is substernal and described as severe pressure-like pain. There is associated nausea. She has not had chest pain like this in the past. She had a lot of belching overnight. At the time of my evaluation she complains of 6/10 chest pain.  A code STEMI was activated in the emergency room. She has received aspirin, IV heparin, morphine, and Zofran. The patient has a history of diabetes. She lost weight and was able to stop medicines. States she has gained some weight back. She had surgery one year ago for vulvar cancer. 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   Past Medical History:  Diagnosis Date  . Diabetes [facility])      Surgical History:  Past Surgical History:  Procedure Laterality Date  . CESAREAN SECTION    . LAMINECTOMY  1986  . TONSILLECTOMY  1981  . WISDOM TOOTH EXTRACTION       Home Meds: Prior to Admission medications   Not on File    Allergies: No Known Allergies  Social History   Social History  . Marital status: Married    Spouse name: N/A  . Number of children: N/A  . Years of education: N/A   Occupational History  . Not on file.   Social History Main Topics  . Smoking status: Current Every Day Smoker    Packs/day: 0.50    Types: Cigarettes  . Smokeless tobacco: Never Used  . Alcohol use No  . Drug use: No  . Sexual activity: Yes   Other Topics Concern  . Not on file   Social History Narrative   RN at [employer]     Married     Family History  Problem Relation Age of Onset  . Uterine cancer Mother   . Heart disease Mother     Review of Systems: General: negative for chills, fever, night sweats or weight changes.  ENT: negative for rhinorrhea or epistaxis Cardiovascular: see HPI Dermatological: negative for rash Respiratory: negative for cough or wheezing GI: negative for diarrhea, bright red blood per rectum, melena, or hematemesis GU: no hematuria, urgency, or frequency Neurologic: negative for visual changes, syncope, headache, or dizziness Heme: no easy bruising or bleeding Endo: negative for excessive thirst, thyroid disorder, or flushing Musculoskeletal: negative for joint pain or swelling, negative for myalgias All other systems reviewed and are otherwise negative except as noted above.  Physical Exam: Blood pressure 115/73, pulse 74, temperature 97.9 F (36.6 C), temperature source Oral, resp. rate 17, height 5' (1.524 m), weight 220 lb (99.8 kg), SpO2 100 %. General: Well developed, well nourished, alert and oriented, pleasant obese woman in mild distress secondary to pain HEENT: Normocephalic, atraumatic, sclera anicteric Neck: Supple. Carotids 2+ without bruits. JVP normal Lungs: Clear bilaterally to auscultation without wheezes, rales, or rhonchi. Breathing is unlabored. Heart: RRR with normal S1 and S2. No murmurs, rubs, or gallops appreciated. Abdomen: Soft, non-tender, non-distended with normoactive bowel sounds. No hepatomegaly. No rebound/guarding. No obvious abdominal masses.  Back: No CVA tenderness Msk:  Strength and tone appear normal for age. Extremities: No clubbing, cyanosis, or edema.  Distal pedal pulses are 2+ and equal bilaterally. Right radial pulse 1+, left radial pulse trace Neuro: CNII-XII intact, moves all extremities spontaneously. Psych:  Responds to questions appropriately with a normal affect. Skin: warm and dry without rash   Labs:   Lab Results   Component Value Date   WBC 5.9 11/01/2016   HGB 16.1 (H) 11/01/2016   HCT 46.1 (H) 11/01/2016   MCV 88.0 11/01/2016   PLT 230 11/01/2016   No results for input(s): NA, K, CL, CO2, BUN, CREATININE, CALCIUM, PROT, BILITOT, ALKPHOS, ALT, AST, GLUCOSE in the last 168 hours.  Invalid input(s): LABALBU No results for input(s): CKTOTAL, CKMB, TROPONINI in the last 72 hours. Lab Results  Component Value Date   CHOL 207 (H) 06/14/2015   HDL 57.80 06/14/2015   LDLCALC 138 (H) 06/14/2015   TRIG 54.0 06/14/2015   No results found for: DDIMER  Radiology/Studies:  No results found.   EKG: NSR with inferolateral STEMI  CARDIAC STUDIES: pending  ASSESSMENT AND PLAN:  1. Acute inferolateral STEMI: Patient with ongoing ischemic chest pain for approximately 8 hours, EKG clearly diagnostic of acute STEMI. Plan emergency cardiac catheterization and primary PCI. The patient is received aspirin and weight-based heparin in the emergency department. Post MI medical therapy will be initiated. Further disposition pending cardiac catheterization results.  2. Type 2 diabetes: Patient with history of diabetes, off of medicine after weight loss. We will reassess with a hemoglobin A1c and sliding scale insulin while she is hospitalized. Lifestyle modification will be necessary.  3. Hypertension: Will review her home regimen. Will initiate a beta blocker and ACE inhibitor is tolerated.  4. Hyperlipidemia with LDL cholesterol above goal: Start atorvastatin 80 mg  Disposition: Pending cardiac catheterization result.  Deatra James MD 11/01/2016, 8:35 AM

## 2016-11-01 NOTE — Progress Notes (Signed)
   11/01/16 1055  Clinical Encounter Type  Visited With Patient and family together  Visit Type Other (Comment) ([facility])  Spiritual Encounters  Spiritual Needs Emotional  Stress Factors  Patient Stress Factors Not reviewed  Family Stress Factors Family relationships  Introduction to Pt and family. Offered emotional support. Family coping.

## 2016-11-01 NOTE — Significant Event (Signed)
Rapid Response Event Note  Overview: Time Called: [id number] Arrival Time: 0750 Event Type: Cardiac  Initial Focused Assessment: Called to see patient for Code STEMI. Full staff present in ED.  Interventions: Assisted with monitoring and transfer to the Cath Lab.  Plan of Care (if not transferred): Will assist as needed.   Event Summary:   Left patient with Cath lab staff at  0830.    at          [facility]

## 2016-11-01 NOTE — Progress Notes (Signed)
Patient complaining of severe chest pain 8/10. Skin ashen, patient vomited x 1.  Repeat EKG done. Dr. Burt Knack notified. Pt put on 4L of oxygen [place]. Given 4mg  Morphine IV per Dr. Burt Knack.  VSS.

## 2016-11-01 NOTE — Progress Notes (Addendum)
Patient just underwent primary PCI for treatment of an acute STEMI this morning. She did very well with good ST segment resolution after the procedure. However, she has developed recurrent severe chest pain, diaphoresis, and ST elevation. Her clinical scenario is highly concerning for acute stent thrombosis. She will return to the cardiac catheterization lab emergently for repeat angiography and likely PCI. Discussed the plan with the patient and her family at the bedside. The cath lab has been activated. Aggrastat has been started. She is hemodynamically stable at the time of my assessment.  Sherren Mocha 11/01/2016 11:17 AM  Addendum: Pt was found to have acute stent thrombosis (Type 4b MI)  Sherren Mocha 11/17/2016 6:13 AM

## 2016-11-01 NOTE — ED Provider Notes (Signed)
Patient seen in triage, ECG w concerning changes and Code STEMI called.  Patient escorted to resuscitation bay.   EKG Interpretation  Date/Time:  Saturday November 01 2016 07:44:07 EST Ventricular Rate:  77 PR Interval:  152 QRS Duration: 88 QT Interval:  378 QTC Calculation: 427 R Axis:   76 Text Interpretation:   Critical Test Result: STEMI  AGE AND GENDER SPECIFIC ECG ANALYSIS Sinus rhythm with marked sinus arrhythmia ST elevation consider inferolateral injury or acute infarct ** **   ACUTE MI / STEMI ** ** Abnormal ekg Confirmed by Carmin Muskrat  MD ([id number]) on 11/01/2016 7:47:01 AM           Carmin Muskrat, MD 11/01/16 [phone]

## 2016-11-01 NOTE — ED Provider Notes (Signed)
[facility] DEPT Provider Note   CSN: [id number] Arrival date & time: 11/01/16  [id number]     History   Chief Complaint Chief Complaint  Patient presents with  . Code STEMI    HPI Karen Hartman is a 60 y.o. female.  Pt started having chest pain yesterday.  The episodes were coming and going.  This am she started having constant severe chest pain in the center of her chest.  Some radiation of the pain to her arm. She has felt nauseated and has bene burping a lot.  She denies shortness of breath.     The history is provided by the patient.  Chest Pain   This is a new problem. The current episode started yesterday. The problem has been gradually improving. The pain is present in the substernal region. The quality of the pain is described as pressure-like. Associated symptoms include diaphoresis and nausea. Pertinent negatives include no abdominal pain, no cough, no fever and no shortness of breath. She has tried nothing for the symptoms. The treatment provided no relief. Risk factors include obesity and smoking/tobacco exposure.  Her past medical history is significant for diabetes.  Pertinent negatives for past medical history include no aneurysm and no MI.    Past Medical History:  Diagnosis Date  . Diabetes [facility])     Patient Active Problem List   Diagnosis Date Noted  . Well woman exam 06/14/2015  . Diabetes ([place]) 06/14/2015    Past Surgical History:  Procedure Laterality Date  . CESAREAN SECTION    . LAMINECTOMY  1986  . TONSILLECTOMY  1981  . WISDOM TOOTH EXTRACTION      OB History    No data available       Home Medications    Prior to Admission medications   Not on File    Family History Family History  Problem Relation Age of Onset  . Uterine cancer Mother   . Heart disease Mother     Social History Social History  Substance Use Topics  . Smoking status: Current Every Day Smoker    Packs/day: 0.50    Types: Cigarettes  . Smokeless  tobacco: Never Used  . Alcohol use No     Allergies   Patient has no known allergies.   Review of Systems Review of Systems  Constitutional: Positive for diaphoresis. Negative for fever.  Respiratory: Negative for cough and shortness of breath.   Cardiovascular: Positive for chest pain.  Gastrointestinal: Positive for nausea. Negative for abdominal pain.  All other systems reviewed and are negative.    Physical Exam Updated Vital Signs BP (!) 136/105   Pulse 98   Temp 97.9 F (36.6 C) (Oral)   Resp 20   Ht 5' (1.524 m)   Wt 99.8 kg   SpO2 100%   BMI 42.97 kg/m   Physical Exam  Constitutional:  overweight  HENT:  Head: Normocephalic and atraumatic.  Right Ear: External ear normal.  Left Ear: External ear normal.  Eyes: Conjunctivae are normal. Right eye exhibits no discharge. Left eye exhibits no discharge. No scleral icterus.  Neck: Neck supple. No tracheal deviation present.  Cardiovascular: Normal rate, regular rhythm and intact distal pulses.   Pulmonary/Chest: Effort normal and breath sounds normal. No stridor. No respiratory distress. She has no wheezes. She has no rales.  Abdominal: Soft. Bowel sounds are normal. She exhibits no distension. There is no tenderness. There is no rebound and no guarding.  Musculoskeletal: She exhibits no edema  or tenderness.  Neurological: She is alert. She has normal strength. No cranial nerve deficit (no facial droop, extraocular movements intact, no slurred speech) or sensory deficit. She exhibits normal muscle tone. She displays no seizure activity. Coordination normal.  Skin: Skin is warm and dry. No rash noted. She is not diaphoretic.  Psychiatric: She has a normal mood and affect.  Nursing note and vitals reviewed.    ED Treatments / Results  Labs (all labs ordered are listed, but only abnormal results are displayed) Labs Reviewed  CBC - Abnormal; Notable for the following:       Result Value   RBC 5.24 (*)     Hemoglobin 16.1 (*)    HCT 46.1 (*)    All other components within normal limits  DIFFERENTIAL  PROTIME-INR  APTT  COMPREHENSIVE METABOLIC PANEL  TROPONIN I  LIPID PANEL    EKG  EKG Interpretation  Date/Time:  Saturday November 01 2016 07:44:07 EST Ventricular Rate:  77 PR Interval:  152 QRS Duration: 88 QT Interval:  378 QTC Calculation: 427 R Axis:   76 Text Interpretation:   Critical Test Result: STEMI  AGE AND GENDER SPECIFIC ECG ANALYSIS   Sinus rhythm with marked sinus arrhythmia ST elevation consider inferolateral injury or acute infarct ** ** ACUTE MI / STEMI ** ** Abnormal ekg Confirmed by Carmin Muskrat  MD ([id number]) on 11/01/2016 7:47:01 AM Also confirmed by Carmin Muskrat  MD [phone]), editor [place]  CCT, [place] ([id number])  on 11/01/2016 8:07:23 AM       Radiology No results found.  Procedures .Critical Care Performed by: Dorie Rank Authorized by: Dorie Rank   Critical care provider statement:    Critical care time (minutes):  40   Critical care was time spent personally by me on the following activities:  Discussions with consultants, evaluation of patient's response to treatment, examination of patient, ordering and performing treatments and interventions, ordering and review of laboratory studies, ordering and review of radiographic studies, pulse oximetry, re-evaluation of patient's condition, obtaining history from patient or surrogate and review of old charts   (including critical care time)  Medications Ordered in ED Medications  nitroGLYCERIN (NITROSTAT) 0.4 MG SL tablet (not administered)  0.9 %  sodium chloride infusion (20 mL/hr Intravenous New Bag/Given 11/01/16 0808)  morphine 4 MG/ML injection 4 mg (not administered)  aspirin chewable tablet 324 mg (324 mg Oral Given 11/01/16 0808)  heparin injection 60 Units/kg (4,000 Units Intravenous Given 11/01/16 0808)  morphine 4 MG/ML injection 4 mg (4 mg Intravenous Given 11/01/16 0808)  ondansetron  (ZOFRAN) injection 4 mg (4 mg Intravenous Given 11/01/16 0801)     Initial Impression / Assessment and Plan / ED Course  I have reviewed the triage vital signs and the nursing notes.  Pertinent labs & imaging results that were available during my care of the patient were reviewed by me and considered in my medical decision making (see chart for details)    Clinical Course as of Nov 02 815  Sat Nov 01, 2016  0802 Spoke with Dr Burt Knack regarding code stemi activation  [JK]  0802 Pt give a dose of morphine for pain.  Developed nausea and vomiting.  Zofran given.   Nausea and pain is improving , now to 6/10.  [id number] Will give an additional 4 mg morphine for pain, while waiting for cath labs.  Vitals stable  [JK]    Clinical Course User Index [JK] Dorie Rank, MD   Pt presented  with chest pain.  EKG consistent with STEMI.  Code stemi activated.  Some improvement with pain meds and nausea.  Stable in the ED pending cath lab transport.   Final Clinical Impressions(s) / ED Diagnoses   Final diagnoses:  Acute ST elevation myocardial infarction (STEMI), unspecified artery (HCC)      Dorie Rank, MD 11/01/16 [phone]

## 2016-11-02 ENCOUNTER — Inpatient Hospital Stay (HOSPITAL_COMMUNITY): Payer: Managed Care, Other (non HMO)

## 2016-11-02 DIAGNOSIS — I2111 ST elevation (STEMI) myocardial infarction involving right coronary artery: Secondary | ICD-10-CM

## 2016-11-02 DIAGNOSIS — E782 Mixed hyperlipidemia: Secondary | ICD-10-CM

## 2016-11-02 DIAGNOSIS — I10 Essential (primary) hypertension: Secondary | ICD-10-CM

## 2016-11-02 DIAGNOSIS — I2510 Atherosclerotic heart disease of native coronary artery without angina pectoris: Secondary | ICD-10-CM

## 2016-11-02 LAB — BASIC METABOLIC PANEL
Anion gap: 7 (ref 5–15)
BUN: 9 mg/dL (ref 6–20)
CHLORIDE: 104 mmol/L (ref 101–111)
CO2: 26 mmol/L (ref 22–32)
CREATININE: 0.65 mg/dL (ref 0.44–1.00)
Calcium: 8.4 mg/dL — ABNORMAL LOW (ref 8.9–10.3)
GFR calc Af Amer: >60 mL/min (ref >60)
GFR calc non Af Amer: >60 mL/min (ref >60)
GLUCOSE: 159 mg/dL — AB (ref 65–99)
POTASSIUM: 3.5 mmol/L (ref 3.5–5.1)
SODIUM: 137 mmol/L (ref 135–145)

## 2016-11-02 LAB — CBC
HEMATOCRIT: 39.4 % (ref 36.0–46.0)
Hemoglobin: 13 g/dL (ref 12.0–15.0)
MCH: 30.2 pg (ref 26.0–34.0)
MCHC: 33 g/dL (ref 30.0–36.0)
MCV: 91.6 fL (ref 78.0–100.0)
Platelets: 199 10*3/uL (ref 150–400)
RBC: 4.3 MIL/uL (ref 3.87–5.11)
RDW: 13.1 % (ref 11.5–15.5)
WBC: 8.4 10*3/uL (ref 4.0–10.5)

## 2016-11-02 LAB — GLUCOSE, CAPILLARY
GLUCOSE-CAPILLARY: 170 mg/dL — AB (ref 65–99)
GLUCOSE-CAPILLARY: 96 mg/dL (ref 65–99)
GLUCOSE-CAPILLARY: 133 mg/dL — AB (ref 65–99)
GLUCOSE-CAPILLARY: 98 mg/dL (ref 65–99)
Glucose-Capillary: 159 mg/dL — ABNORMAL HIGH (ref 65–99)
Glucose-Capillary: 172 mg/dL — ABNORMAL HIGH (ref 65–99)

## 2016-11-02 LAB — HEMOGLOBIN A1C
HEMOGLOBIN A1C: 7.8 % — AB (ref 4.8–5.6)
MEAN PLASMA GLUCOSE: 177 mg/dL

## 2016-11-02 MED ORDER — PERFLUTREN LIPID MICROSPHERE
1.0000 mL | INTRAVENOUS | Status: AC | PRN
  Filled 2016-11-02: qty 10

## 2016-11-02 MED ORDER — PERFLUTREN LIPID MICROSPHERE
INTRAVENOUS | Status: AC
  Administered 2016-11-02: 2 mL
  Filled 2016-11-02: qty 10

## 2016-11-02 NOTE — Progress Notes (Signed)
Report called to [place], Therapist, sports. Pt transferred to 3 west. VSS.

## 2016-11-02 NOTE — Progress Notes (Signed)
Venous sheath pulled from left groin with manual pressure hold x 10 minutes. No Bleeding occurred after releasing pressure. 2 x 2 dressing applied and will monitor closely for bleeding. Aggrastat continues til 0530. Patient voiced no complaints. VSS. Right groin level zero with gauze dressing intact from earlier sheath pull. Monitoring patient closely.

## 2016-11-02 NOTE — Progress Notes (Signed)
   Subjective:  POD # 1 inf STEMI s/p PCI/DES with subsequent AST s/p Angiojet mechanical thrombectomy on Aggrastat post. Currently denies CP/SOB  Objective:  Temp:  [97.9 F (36.6 C)-98.5 F (36.9 C)] 98.2 F (36.8 C) (12/24 0400) Pulse Rate:  [0-130] 73 (12/24 0858) Resp:  [0-37] 22 (12/24 0600) BP: (99-159)/(54-91) 117/58 (12/24 0858) SpO2:  [0 %-100 %] 95 % (12/24 0659) Weight:  [224 lb 10.4 oz (101.9 kg)] 224 lb 10.4 oz (101.9 kg) (12/24 0659) Weight change:   Intake/Output from previous day: 12/23 0701 - 12/24 0700 In: 1725.3 [P.O.:600; I.V.:1125.3] Out: 1225 [Urine:1225]  Intake/Output from this shift: Total I/O In: -  Out: 200 [Urine:200]  Physical Exam: General appearance: alert and no distress Neck: no adenopathy, no carotid bruit, no JVD, supple, symmetrical, trachea midline and thyroid not enlarged, symmetric, no tenderness/mass/nodules Lungs: clear to auscultation bilaterally Heart: regular rate and rhythm, S1, S2 normal, no murmur, click, rub or gallop Extremities: R/L CFA access sites look OK  Lab Results: Results for orders placed or performed during the hospital encounter of 11/01/16 (from the past 48 hour(s))  CBC     Status: Abnormal   Collection Time: 11/01/16  7:50 AM  Result Value Ref Range   WBC 5.9 4.0 - 10.5 K/uL   RBC 5.24 (H) 3.87 - 5.11 MIL/uL   Hemoglobin 16.1 (H) 12.0 - 15.0 g/dL   HCT 46.1 (H) 36.0 - 46.0 %   MCV 88.0 78.0 - 100.0 fL   MCH 30.7 26.0 - 34.0 pg   MCHC 34.9 30.0 - 36.0 g/dL   RDW 12.5 11.5 - 15.5 %   Platelets 230 150 - 400 K/uL  Differential     Status: None   Collection Time: 11/01/16  7:50 AM  Result Value Ref Range   Neutrophils Relative % 65 %   Neutro Abs 3.8 1.7 - 7.7 K/uL   Lymphocytes Relative 30 %   Lymphs Abs 1.8 0.7 - 4.0 K/uL   Monocytes Relative 5 %   Monocytes Absolute 0.3 0.1 - 1.0 K/uL   Eosinophils Relative 0 %   Eosinophils Absolute 0.0 0.0 - 0.7 K/uL   Basophils Relative 0 %   Basophils  Absolute 0.0 0.0 - 0.1 K/uL  Protime-INR     Status: None   Collection Time: 11/01/16  7:50 AM  Result Value Ref Range   Prothrombin Time 12.4 11.4 - 15.2 seconds   INR 0.93   APTT     Status: Abnormal   Collection Time: 11/01/16  7:50 AM  Result Value Ref Range   aPTT 23 (L) 24 - 36 seconds  Lipid panel     Status: Abnormal   Collection Time: 11/01/16  7:50 AM  Result Value Ref Range   Cholesterol 236 (H) 0 - 200 mg/dL   Triglycerides 98 <150 mg/dL   HDL 56 >40 mg/dL   Total CHOL/HDL Ratio 4.2 RATIO   VLDL 20 0 - 40 mg/dL   LDL Cholesterol 160 (H) 0 - 99 mg/dL    Comment:        Total Cholesterol/HDL:CHD Risk Coronary Heart Disease Risk Table                     Men   Women  1/2 Average Risk   3.4   3.3  Average Risk       5.0   4.4  2 X Average Risk   9.6   7.1  3 X   Average Risk  23.4   11.0        Use the calculated Patient Ratio above and the CHD Risk Table to determine the patient's CHD Risk.        ATP III CLASSIFICATION (LDL):  <100     mg/dL   Optimal  100-129  mg/dL   Near or Above                    Optimal  130-159  mg/dL   Borderline  160-189  mg/dL   High  >190     mg/dL   Very High   MRSA PCR Screening     Status: None   Collection Time: 11/01/16  9:58 AM  Result Value Ref Range   MRSA by PCR NEGATIVE NEGATIVE    Comment:        The GeneXpert MRSA Assay (FDA approved for NASAL specimens only), is one component of a comprehensive MRSA colonization surveillance program. It is not intended to diagnose MRSA infection nor to guide or monitor treatment for MRSA infections.   TSH     Status: None   Collection Time: 11/01/16 10:30 AM  Result Value Ref Range   TSH 2.608 0.350 - 4.500 uIU/mL    Comment: Performed by a 3rd Generation assay with a functional sensitivity of <=0.01 uIU/mL.  Brain natriuretic peptide     Status: None   Collection Time: 11/01/16 10:30 AM  Result Value Ref Range   B Natriuretic Peptide 92.0 0.0 - 100.0 pg/mL  Comprehensive  metabolic panel     Status: Abnormal   Collection Time: 11/01/16 10:30 AM  Result Value Ref Range   Sodium 139 135 - 145 mmol/L   Potassium 4.4 3.5 - 5.1 mmol/L   Chloride 102 101 - 111 mmol/L   CO2 21 (L) 22 - 32 mmol/L   Glucose, Bld 228 (H) 65 - 99 mg/dL   BUN 16 6 - 20 mg/dL   Creatinine, Ser 0.86 0.44 - 1.00 mg/dL   Calcium 10.4 (H) 8.9 - 10.3 mg/dL   Total Protein 6.9 6.5 - 8.1 g/dL   Albumin 4.1 3.5 - 5.0 g/dL   AST 31 15 - 41 U/L   ALT QUANTITY NOT SUFFICIENT, UNABLE TO PERFORM TEST 14 - 54 U/L   Alkaline Phosphatase 69 38 - 126 U/L   Total Bilirubin QUANTITY NOT SUFFICIENT, UNABLE TO PERFORM TEST 0.3 - 1.2 mg/dL   GFR calc non Af Amer >60 >60 mL/min   GFR calc Af Amer >60 >60 mL/min    Comment: (NOTE) The eGFR has been calculated using the CKD EPI equation. This calculation has not been validated in all clinical situations. eGFR's persistently <60 mL/min signify possible Chronic Kidney Disease.    Anion gap 16 (H) 5 - 15  Troponin I (q 6hr x 3)     Status: Abnormal   Collection Time: 11/01/16 10:30 AM  Result Value Ref Range   Troponin I 0.94 (HH) <0.03 ng/mL    Comment: CRITICAL RESULT CALLED TO, READ BACK BY AND VERIFIED WITH: RN ALEXANDER,K AT 1150 [id number] MARTINB   Glucose, capillary     Status: Abnormal   Collection Time: 11/01/16  3:51 PM  Result Value Ref Range   Glucose-Capillary 196 (H) 65 - 99 mg/dL  Troponin I     Status: Abnormal   Collection Time: 11/01/16  4:00 PM  Result Value Ref Range   Troponin I 20.98 (HH) <0.03 ng/mL    Comment:  CRITICAL VALUE NOTED.  VALUE IS CONSISTENT WITH PREVIOUSLY REPORTED AND CALLED VALUE.  Glucose, capillary     Status: Abnormal   Collection Time: 11/01/16  8:10 PM  Result Value Ref Range   Glucose-Capillary 171 (H) 65 - 99 mg/dL  Troponin I (q 6hr x 3)     Status: Abnormal   Collection Time: 11/01/16  9:32 PM  Result Value Ref Range   Troponin I 29.65 (HH) <0.03 ng/mL    Comment: CRITICAL VALUE NOTED.  VALUE IS  CONSISTENT WITH PREVIOUSLY REPORTED AND CALLED VALUE.  Glucose, capillary     Status: Abnormal   Collection Time: 11/02/16 12:35 AM  Result Value Ref Range   Glucose-Capillary 159 (H) 65 - 99 mg/dL  Glucose, capillary     Status: Abnormal   Collection Time: 11/02/16  3:28 AM  Result Value Ref Range   Glucose-Capillary 172 (H) 65 - 99 mg/dL  Basic metabolic panel     Status: Abnormal   Collection Time: 11/02/16  3:37 AM  Result Value Ref Range   Sodium 137 135 - 145 mmol/L   Potassium 3.5 3.5 - 5.1 mmol/L    Comment: DELTA CHECK NOTED   Chloride 104 101 - 111 mmol/L   CO2 26 22 - 32 mmol/L   Glucose, Bld 159 (H) 65 - 99 mg/dL   BUN 9 6 - 20 mg/dL   Creatinine, Ser 0.65 0.44 - 1.00 mg/dL   Calcium 8.4 (L) 8.9 - 10.3 mg/dL   GFR calc non Af Amer >60 >60 mL/min   GFR calc Af Amer >60 >60 mL/min    Comment: (NOTE) The eGFR has been calculated using the CKD EPI equation. This calculation has not been validated in all clinical situations. eGFR's persistently <60 mL/min signify possible Chronic Kidney Disease.    Anion gap 7 5 - 15  CBC     Status: None   Collection Time: 11/02/16  3:37 AM  Result Value Ref Range   WBC 8.4 4.0 - 10.5 K/uL   RBC 4.30 3.87 - 5.11 MIL/uL   Hemoglobin 13.0 12.0 - 15.0 g/dL   HCT 39.4 36.0 - 46.0 %   MCV 91.6 78.0 - 100.0 fL   MCH 30.2 26.0 - 34.0 pg   MCHC 33.0 30.0 - 36.0 g/dL   RDW 13.1 11.5 - 15.5 %   Platelets 199 150 - 400 K/uL  Glucose, capillary     Status: Abnormal   Collection Time: 11/02/16  8:03 AM  Result Value Ref Range   Glucose-Capillary 170 (H) 65 - 99 mg/dL    Imaging: Imaging results have been reviewed  Tele- NSR with occasional PVCs   Assessment/Plan:   1. Active Problems: 2.   Acute ST elevation myocardial infarction (STEMI) (HCC) 3.   Hyperlipidemia, mixed 4. HTN 5. Tobacco abuse   Time Spent Directly with Patient:  30 minutes  Length of Stay:  LOS: 1 day   POD # 1 Inf STEMI Rx with RCA DES. No signif other  CAD with overall preserved LV FXN and inf WMA. She had AST and was taken back to the lab for re look, Angiojet mechanical thrombectomy with Aggrastat infusion. R & L CFA access sites look good. Trop 29. Other labs OK. Will Tx to tele. [facility]. Ambulate. Home tomorrow or Tuesday. I stressed medication compliance and smoking cessation.   ,  11/02/2016, 9:24 AM 

## 2016-11-02 NOTE — Progress Notes (Signed)
  Echocardiogram 2D Echocardiogram with Definity has been performed.  Tresa Res 11/02/2016, 10:52 AM

## 2016-11-03 ENCOUNTER — Inpatient Hospital Stay (HOSPITAL_COMMUNITY): Payer: Managed Care, Other (non HMO)

## 2016-11-03 DIAGNOSIS — I472 Ventricular tachycardia: Secondary | ICD-10-CM

## 2016-11-03 DIAGNOSIS — I213 ST elevation (STEMI) myocardial infarction of unspecified site: Secondary | ICD-10-CM

## 2016-11-03 DIAGNOSIS — I4721 Torsades de pointes: Secondary | ICD-10-CM

## 2016-11-03 DIAGNOSIS — Z452 Encounter for adjustment and management of vascular access device: Secondary | ICD-10-CM

## 2016-11-03 LAB — MAGNESIUM: MAGNESIUM: 1.9 mg/dL (ref 1.7–2.4)

## 2016-11-03 LAB — TROPONIN I
TROPONIN I: 8.55 ng/mL — AB (ref <0.03)
TROPONIN I: 6.5 ng/mL — AB (ref <0.03)

## 2016-11-03 LAB — HEPARIN LEVEL (UNFRACTIONATED): Heparin Unfractionated: <0.1 IU/mL — ABNORMAL LOW (ref 0.30–0.70)

## 2016-11-03 LAB — GLUCOSE, CAPILLARY
GLUCOSE-CAPILLARY: 129 mg/dL — AB (ref 65–99)
GLUCOSE-CAPILLARY: 201 mg/dL — AB (ref 65–99)
Glucose-Capillary: 125 mg/dL — ABNORMAL HIGH (ref 65–99)
Glucose-Capillary: 157 mg/dL — ABNORMAL HIGH (ref 65–99)
Glucose-Capillary: 171 mg/dL — ABNORMAL HIGH (ref 65–99)

## 2016-11-03 LAB — POTASSIUM: Potassium: 3.7 mmol/L (ref 3.5–5.1)

## 2016-11-03 MED ORDER — LIDOCAINE IN D5W 4-5 MG/ML-% IV SOLN
1.0000 mg/min | INTRAVENOUS | Status: DC
  Administered 2016-11-03: 1 mg/min via INTRAVENOUS
  Filled 2016-11-03: qty 500

## 2016-11-03 MED ORDER — MAGNESIUM SULFATE 2 GM/50ML IV SOLN
2.0000 g | Freq: Once | INTRAVENOUS | Status: AC
  Administered 2016-11-03: 2 g via INTRAVENOUS
  Filled 2016-11-03: qty 50

## 2016-11-03 MED ORDER — METOPROLOL SUCCINATE ER 25 MG PO TB24: 12.5000 mg | ORAL_TABLET | Freq: Every day | ORAL | Status: DC

## 2016-11-03 MED ORDER — METOPROLOL SUCCINATE ER 25 MG PO TB24
12.5000 mg | ORAL_TABLET | Freq: Every day | ORAL | Status: DC
  Administered 2016-11-03: 12.5 mg via ORAL
  Filled 2016-11-03: qty 1

## 2016-11-03 MED ORDER — AMIODARONE HCL 200 MG PO TABS
400.0000 mg | ORAL_TABLET | Freq: Once | ORAL | Status: AC
  Administered 2016-11-03: 400 mg via ORAL
  Filled 2016-11-03: qty 2

## 2016-11-03 MED ORDER — POTASSIUM CHLORIDE CRYS ER 20 MEQ PO TBCR
40.0000 meq | EXTENDED_RELEASE_TABLET | Freq: Once | ORAL | Status: AC
  Administered 2016-11-03: 40 meq via ORAL
  Filled 2016-11-03: qty 2

## 2016-11-03 MED ORDER — HEPARIN (PORCINE) IN NACL 100-0.45 UNIT/ML-% IJ SOLN
1150.0000 [IU]/h | INTRAMUSCULAR | Status: DC
  Administered 2016-11-03: 900 [IU]/h via INTRAVENOUS
  Filled 2016-11-03 (×2): qty 250

## 2016-11-03 MED ORDER — AMIODARONE LOAD VIA INFUSION
150.0000 mg | Freq: Once | INTRAVENOUS | Status: AC
  Administered 2016-11-03: 150 mg via INTRAVENOUS
  Filled 2016-11-03: qty 83.34

## 2016-11-03 MED ORDER — AMIODARONE HCL 200 MG PO TABS: 400.0000 mg | ORAL_TABLET | Freq: Every day | ORAL | Status: DC

## 2016-11-03 MED ORDER — AMIODARONE HCL IN DEXTROSE 360-4.14 MG/200ML-% IV SOLN
60.0000 mg/h | INTRAVENOUS | Status: AC
  Administered 2016-11-03 (×2): 60 mg/h via INTRAVENOUS
  Filled 2016-11-03: qty 200

## 2016-11-03 MED ORDER — LIDOCAINE BOLUS VIA INFUSION
100.0000 mg | Freq: Once | INTRAVENOUS | Status: AC
  Administered 2016-11-03: 100 mg via INTRAVENOUS
  Filled 2016-11-03: qty 100

## 2016-11-03 MED ORDER — AMIODARONE HCL IN DEXTROSE 360-4.14 MG/200ML-% IV SOLN
30.0000 mg/h | INTRAVENOUS | Status: DC
  Administered 2016-11-03: 30 mg/h via INTRAVENOUS
  Filled 2016-11-03 (×2): qty 200

## 2016-11-03 MED ORDER — AMIODARONE HCL 200 MG PO TABS: 400.0000 mg | ORAL_TABLET | Freq: Four times a day (QID) | ORAL | Status: DC

## 2016-11-03 MED ORDER — HEPARIN BOLUS VIA INFUSION
2000.0000 [IU] | Freq: Once | INTRAVENOUS | Status: AC
  Administered 2016-11-03: 2000 [IU] via INTRAVENOUS
  Filled 2016-11-03: qty 2000

## 2016-11-03 NOTE — Progress Notes (Signed)
Subjective:    No complaints this AM  Objective:   Temp:  [97.7 F (36.5 C)-98.4 F (36.9 C)] 98.1 F (36.7 C) (12/25 0400) Pulse Rate:  [73-80] 80 (12/25 0400) Resp:  [15-20] 18 (12/25 0400) BP: (92-117)/(42-58) 97/43 (12/25 0400) SpO2:  [94 %-98 %] 96 % (12/25 0400) Weight:  [224 lb 3.2 oz (101.7 kg)] 224 lb 3.2 oz (101.7 kg) (12/25 0400) Last BM Date: 11/01/16  Filed Weights   11/01/16 0755 11/02/16 0659 11/03/16 0400  Weight: 220 lb (99.8 kg) 224 lb 10.4 oz (101.9 kg) 224 lb 3.2 oz (101.7 kg)    Intake/Output Summary (Last 24 hours) at 11/03/16 0829 Last data filed at 11/03/16 0400  Gross per 24 hour  Intake              780 ml  Output              700 ml  Net               80 ml    Telemetry:SR. Ventricular ectopy, NSVT up to 8 beats  Exam:  General: NAD  HEENT: sclera clear, throat clear  Resp: CTAB  Cardiac: RRR, no m/r/g, no jvd  GI: abdomen soft NT, ND  MSK: no LE edema  Neuro: no focal deficits  Psych: appropriate affect  Lab Results:  Basic Metabolic Panel:  Recent Labs Lab 11/01/16 1030 11/02/16 0337  NA 139 137  K 4.4 3.5  CL 102 104  CO2 21* 26  GLUCOSE 228* 159*  BUN 16 9  CREATININE 0.86 0.65  CALCIUM 10.4* 8.4*    Liver Function Tests:  Recent Labs Lab 11/01/16 1030  AST 31  ALT QUANTITY NOT SUFFICIENT, UNABLE TO PERFORM TEST  ALKPHOS 69  BILITOT QUANTITY NOT SUFFICIENT, UNABLE TO PERFORM TEST  PROT 6.9  ALBUMIN 4.1    CBC:  Recent Labs Lab 11/01/16 0750 11/02/16 0337  WBC 5.9 8.4  HGB 16.1* 13.0  HCT 46.1* 39.4  MCV 88.0 91.6  PLT 230 199    Cardiac Enzymes:  Recent Labs Lab 11/01/16 1030 11/01/16 1600 11/01/16 2132  TROPONINI 0.94* 20.98* 29.65*    BNP: No results for input(s): PROBNP in the last 8760 hours.  Coagulation:  Recent Labs Lab 11/01/16 0750  INR 0.93    ECG:   Medications:   Scheduled Medications: . aspirin EC  81 mg Oral Daily  . atorvastatin  80 mg Oral  q1800  . heparin  5,000 Units Subcutaneous Q8H  . insulin aspart  0-15 Units Subcutaneous TID WC  . lisinopril  2.5 mg Oral Daily  . metoprolol tartrate  25 mg Oral BID  . sodium chloride flush  3 mL Intravenous Q12H  . sodium chloride flush  3 mL Intravenous Q12H  . ticagrelor  90 mg Oral BID     Infusions: . sodium chloride Stopped (11/02/16 0644)     PRN Medications:  sodium chloride, sodium chloride, acetaminophen, alum & mag hydroxide-simeth, diazepam, morphine injection, nitroGLYCERIN, ondansetron (ZOFRAN) IV, oxyCODONE-acetaminophen, promethazine, sodium chloride flush, sodium chloride flush     Assessment/Plan    1. STEMI - s/p DES to RCA. Had acute stent thrombosis, received angiojet mechanical thrombectomy and aggrastat infusion.  - echo LVEF [id number], grade I diastolic dysfunction, akinesis basal and mid inferoseptal walls - medical therapy with  ASA, atorva 80, lisinopril 2.5, lopressor 25mg  bid, brillinta.  - soft bp's, lopressor held last night. Will change to Toprol XL 12.5mg   daily in setting of systolic dysfunction. Hold lisionpril at this time in preference for beta blocker given her ventricular ectopy. If stable bp's today can restart tomorrow.    2. Acute systolic HF - in setting of STEMI, LVEF by echo 35-40% - medical therapy with lisinopril 2.5mg  daily. Change lopressor to Toprol XL 12.5 mg daily. Soft bp's limit medical therapy - hold ACE-I today as listed above  3. NSVT - short runs of NSVT this AM. Lopressor held last night due to soft bp's. K 3.5, we will replace. Add on Mg.  - dose Toprol XL 12.5mg  this AM, continue tele.         Carlyle Dolly, M.D.

## 2016-11-03 NOTE — Progress Notes (Signed)
  Amiodarone Drug - Drug Interaction Consult Note  Recommendations: Current medications reviewed for drug interactions with amiodarone. Findings: statins may increase risk of myopathy, beta-blockers may increase risk for bradycardia, and ondansetron PRN can increase QTc.   Recommend monitoring for the above side effects and discontinuing QT prolonging agents, as clinically appropriate.   Amiodarone is metabolized by the cytochrome P450 system and therefore has the potential to cause many drug interactions. Amiodarone has an average plasma half-life of 50 days (range 20 to 100 days).   There is potential for drug interactions to occur several weeks or months after stopping treatment and the onset of drug interactions may be slow after initiating amiodarone.   [x]  Statins: Increased risk of myopathy. Simvastatin- restrict dose to 20mg  daily. Other statins: counsel patients to report any muscle pain or weakness immediately.  []  Anticoagulants: Amiodarone can increase anticoagulant effect. Consider warfarin dose reduction. Patients should be monitored closely and the dose of anticoagulant altered accordingly, remembering that amiodarone levels take several weeks to stabilize.  []  Antiepileptics: Amiodarone can increase plasma concentration of phenytoin, the dose should be reduced. Note that small changes in phenytoin dose can result in large changes in levels. Monitor patient and counsel on signs of toxicity.  [x]  Beta blockers: increased risk of bradycardia, AV block and myocardial depression. Sotalol - avoid concomitant use.  []   Calcium channel blockers (diltiazem and verapamil): increased risk of bradycardia, AV block and myocardial depression.  []   Cyclosporine: Amiodarone increases levels of cyclosporine. Reduced dose of cyclosporine is recommended.  []  Digoxin dose should be halved when amiodarone is started.  []  Diuretics: increased risk of cardiotoxicity if hypokalemia  occurs.  []  Oral hypoglycemic agents (glyburide, glipizide, glimepiride): increased risk of hypoglycemia. Patient's glucose levels should be monitored closely when initiating amiodarone therapy.   [x]  Drugs that prolong the QT interval:  Torsades de pointes risk may be increased with concurrent use - avoid if possible.  Monitor QTc, also keep magnesium/potassium WNL if concurrent therapy can't be avoided. Marland Kitchen Antibiotics: e.g. fluoroquinolones, erythromycin. . Antiarrhythmics: e.g. quinidine, procainamide, disopyramide, sotalol. . Antipsychotics: e.g. phenothiazines, haloperidol.  . Lithium, tricyclic antidepressants, and methadone.   Thank You, Argie Ramming, PharmD Pharmacy Resident  Pager [phone] 11/03/16 12:19 PM

## 2016-11-03 NOTE — Progress Notes (Signed)
ANTICOAGULATION CONSULT NOTE - Initial Consult  Pharmacy Consult for heparin Indication: chest pain/ACS  Assessment: 60 yo female admitted with STEMI and is post PCI with DES to RCA on 12/23. Had VT/Torsades this AM and is now being resumed on heparin per pharmacy for ACS. Last heparin SQ on 12/24 and aggrastat discontinued at 0600 on 12/24. CBC stable, no s/s bleeding. Patient with recent PCI and unknown if she will require additional intervention - so partial bolus was given earlier today. CBC stable, heparin dosing weight 70 kg.  6 hour heparin level is subtherapeutic at < 0.10 with no interruptions in heparin administration and no bleeding noted per RN.   Goal of Therapy:  Heparin level 0.3-0.7 units/ml Monitor platelets by anticoagulation protocol: Yes   Plan:  Increase heparin gtt to 1150 units/hr  Heparin level in 6 hours with AM labs Heparin level and CBC daily Monitor for s/s bleeding F/u cardiology plan   Belia Heman, PharmD PGY1 Pharmacy Resident [phone] (Pager) 11/03/2016 10:14 PM

## 2016-11-03 NOTE — Progress Notes (Addendum)
Called to see patient for VT/Torsades  60 y/o woman with recent inferior STEMI with PCI on 12/23 with acute stent thrombosis. EF 35-40%  This am multiple runs NSVT/torsades on tele with presyncope. (18 seconds of torsades)  Mag 1.9. No BMET. Denies CP. Exam otherwise stable.   Will check BMET. Supp mag. Start heparin and amio. Stat ECG.    Discussed with Dr. Caryl Comes who reviewed strips with me. Will add amio and lidocaine. Recycle troponin. May need to have repeat cath. Move to ICU.   Critical Care Time devoted to patient care services described in this note is 35 Minutes.    Bensimhon, Daniel,MD 12:06 PM

## 2016-11-03 NOTE — Progress Notes (Addendum)
ANTICOAGULATION CONSULT NOTE - Initial Consult  Pharmacy Consult for heparin Indication: chest pain/ACS  No Known Allergies  Patient Measurements: Height: 5' (152.4 cm) Weight: 224 lb 3.2 oz (101.7 kg) IBW/kg (Calculated) : 45.5 Heparin Dosing Weight: 70 kg   Vital Signs: Temp: 98.1 F (36.7 C) (12/25 0400) BP: 96/43 (12/25 0951) Pulse Rate: 78 (12/25 0951)  Labs:  Recent Labs  11/01/16 0750 11/01/16 1030 11/01/16 1600 11/01/16 2132 11/02/16 0337  HGB 16.1*  --   --   --  13.0  HCT 46.1*  --   --   --  39.4  PLT 230  --   --   --  199  APTT 23*  --   --   --   --   LABPROT 12.4  --   --   --   --   INR 0.93  --   --   --   --   CREATININE  --  0.86  --   --  0.65  TROPONINI  --  0.94* 20.98* 29.65*  --     Estimated Creatinine Clearance: 81.3 mL/min (by C-G formula based on SCr of 0.65 mg/dL).   Medical History: Past Medical History:  Diagnosis Date  . Diabetes ([place])   . Hyperlipidemia, mixed 11/01/2016  . STEMI involving right coronary artery ([place]) 11/01/2016   Assessment: 60 yo female admitted with STEMI and is post PCI with DES to RCA on 12/23. Had VT/Torsades this AM and is now being resumed on heparin per pharmacy for ACS. Last heparin SQ on 12/24 and aggrastat discontinued at 0600 on 12/24. CBC stable, no s/s bleeding. Patient with recent PCI and unknown if she will require additional intervention - so will favor partial bolus.   Goal of Therapy:  Heparin level 0.3-0.7 units/ml Monitor platelets by anticoagulation protocol: Yes   Plan:  Heparin bolus 2000 units x1 Start heparin gtt at 900 units/hr  Heparin level in 6 hours Heparin level and CBC daily D/C sq heparin  Monitor for s/s bleeding F/u cardiology plan    Argie Ramming, PharmD Pharmacy Resident  Pager [phone] 11/03/16 12:23 PM

## 2016-11-03 NOTE — Progress Notes (Signed)
CMT notified RN of VT/Torsades on monitor.  Telemetry assessed.  Patient symptomatic, stating she was dizzy.  Rapid Response Denise called and Dr. Haroldine Laws notified.  Dr. Haroldine Laws to come see patient, 2mg  IV magnesium X 1 ordered.

## 2016-11-03 NOTE — Progress Notes (Signed)
CMT notified RN of frequent pvc's and runs of VT. Patient asymptomatic. Dr.  Harl Bowie aware and reviewed strips.  Dr. Harl Bowie stated he would be lowering BB dosage.  Will continue to monitor

## 2016-11-03 NOTE — Procedures (Signed)
Central Venous Catheter Insertion Procedure Note TANIKKA KALISCH [id number] Mar 26, 1956  Procedure: Insertion of Central Venous Catheter Indications: Assessment of intravascular volume, Drug and/or fluid administration and Frequent blood sampling  Procedure Details Consent: Risks of procedure as well as the alternatives and risks of each were explained to the (patient/caregiver).  Consent for procedure obtained. Time Out: Verified patient identification, verified procedure, site/side was marked, verified correct patient position, special equipment/implants available, medications/allergies/relevent history reviewed, required imaging and test results available.  Performed  Maximum sterile technique was used including antiseptics, cap, gloves, gown, hand hygiene, mask and sheet. Skin prep: Chlorhexidine; local anesthetic administered A antimicrobial bonded/coated triple lumen catheter was placed in the right internal jugular vein to 15 cm using the Seldinger technique.  Sutured in place, biopatch applied.   Evaluation Blood flow good Complications: No apparent complications Patient tolerated procedure well. Chest X-ray ordered to verify placement.  CXR: pending.    Procedure performed under direct supervision of Dr. Elsworth Soho and with ultrasound guidance for real time vessel cannulation.      Noe Gens, NP-C [facility] Pgr: [phone] or if no answer [phone] 11/03/2016, 4:13 PM

## 2016-11-04 ENCOUNTER — Encounter (HOSPITAL_COMMUNITY): Payer: Self-pay | Admitting: Cardiovascular Disease

## 2016-11-04 LAB — POCT ACTIVATED CLOTTING TIME
ACTIVATED CLOTTING TIME: 180 s
ACTIVATED CLOTTING TIME: 224 s
Activated Clotting Time: 252 seconds

## 2016-11-04 LAB — CBC
HEMATOCRIT: 39.3 % (ref 36.0–46.0)
HEMOGLOBIN: 13 g/dL (ref 12.0–15.0)
MCH: 30.1 pg (ref 26.0–34.0)
MCHC: 33.1 g/dL (ref 30.0–36.0)
MCV: 91 fL (ref 78.0–100.0)
Platelets: 167 10*3/uL (ref 150–400)
RBC: 4.32 MIL/uL (ref 3.87–5.11)
RDW: 12.8 % (ref 11.5–15.5)
WBC: 7.1 10*3/uL (ref 4.0–10.5)

## 2016-11-04 LAB — GLUCOSE, CAPILLARY
GLUCOSE-CAPILLARY: 160 mg/dL — AB (ref 65–99)
GLUCOSE-CAPILLARY: 120 mg/dL — AB (ref 65–99)
GLUCOSE-CAPILLARY: 157 mg/dL — AB (ref 65–99)
Glucose-Capillary: 141 mg/dL — ABNORMAL HIGH (ref 65–99)

## 2016-11-04 LAB — HEPARIN LEVEL (UNFRACTIONATED): Heparin Unfractionated: 0.2 IU/mL — ABNORMAL LOW (ref 0.30–0.70)

## 2016-11-04 LAB — TROPONIN I: Troponin I: 6.81 ng/mL (ref <0.03)

## 2016-11-04 MED ORDER — HEPARIN SODIUM (PORCINE) 5000 UNIT/ML IJ SOLN
5000.0000 [IU] | Freq: Three times a day (TID) | INTRAMUSCULAR | Status: DC
  Administered 2016-11-04 – 2016-11-05 (×4): 5000 [IU] via SUBCUTANEOUS
  Filled 2016-11-04 (×4): qty 1

## 2016-11-04 MED ORDER — SODIUM CHLORIDE 0.9% FLUSH
10.0000 mL | Freq: Two times a day (BID) | INTRAVENOUS | Status: DC
Start: 2016-11-04 — End: 2016-11-05
  Administered 2016-11-04 – 2016-11-05 (×2): 10 mL

## 2016-11-04 MED ORDER — SODIUM CHLORIDE 0.9% FLUSH: 10.0000 mL | INTRAVENOUS | Status: DC | PRN

## 2016-11-04 MED ORDER — METOPROLOL SUCCINATE ER 25 MG PO TB24
25.0000 mg | ORAL_TABLET | Freq: Every day | ORAL | Status: DC
  Administered 2016-11-04 – 2016-11-05 (×2): 25 mg via ORAL
  Filled 2016-11-04 (×2): qty 1

## 2016-11-04 MED ORDER — DOCUSATE SODIUM 100 MG PO CAPS: 100.0000 mg | ORAL_CAPSULE | Freq: Every day | ORAL | Status: DC | PRN

## 2016-11-04 NOTE — Progress Notes (Signed)
CARDIAC REHAB PHASE I   PRE:  Rate/Rhythm: 72 SR   SaO2: 96%RA  MODE:  Ambulation: 470 ft   POST:  Rate/Rhythm: 82 SR  BP:  Supine:   Sitting: 107/56  Standing:    SaO2: 96%RA 1040-1130 Pt walked 470 ft with asst x 2 with steady gait. Can be asst x 1 since no arrhythmia noted. Tolerated well. MI ed started. Discussed MI restrictions, brilinta with stent, risk factors, smoking cesation, and watching carbs and heart healthy diets. Pt needs to see case manager as she does not have brilinta card yet. Discussed smoking cessation and gave handout. Did not want fake cigarette. Plans to quit cold [place]. Discussed CRP 2 and will refer to [facility]. To recliner with husband in room. Needs to have BM per pt.   Graylon Good, RN BSN  11/04/2016 11:24 AM

## 2016-11-04 NOTE — Progress Notes (Signed)
Patient Name: Karen Hartman Date of Encounter: 11/04/2016  Primary Cardiologist: Dr [facility] Problem List     Active Problems:   Acute ST elevation myocardial infarction (STEMI) ([place])   Hyperlipidemia, mixed   Torsades de pointes ([place])   Encounter for central line placement     Subjective   No chest pain or dyspnea  Inpatient Medications    Scheduled Meds: . aspirin EC  81 mg Oral Daily  . atorvastatin  80 mg Oral q1800  . insulin aspart  0-15 Units Subcutaneous TID WC  . metoprolol succinate  12.5 mg Oral Daily  . sodium chloride flush  3 mL Intravenous Q12H  . sodium chloride flush  3 mL Intravenous Q12H  . ticagrelor  90 mg Oral BID   Continuous Infusions: . sodium chloride Stopped (11/02/16 0644)  . amiodarone 30 mg/hr (11/03/16 2123)  . heparin 1,150 Units/hr (11/03/16 2225)  . lidocaine 1 mg/min (11/03/16 2000)   PRN Meds: sodium chloride, sodium chloride, acetaminophen, alum & mag hydroxide-simeth, diazepam, morphine injection, nitroGLYCERIN, oxyCODONE-acetaminophen, promethazine, sodium chloride flush, sodium chloride flush   Vital Signs    Vitals:   11/04/16 0200 11/04/16 0300 11/04/16 0400 11/04/16 0500  BP: (!) 95/44 (!) 103/57 113/63 104/60  Pulse: 66 65 62 65  Resp: 12 17 13 17   Temp:    98.8 F (37.1 C)  TempSrc:    Oral  SpO2: 97% 96% 96% 96%  Weight:      Height:        Intake/Output Summary (Last 24 hours) at 11/04/16 0753 Last data filed at 11/04/16 0600  Gross per 24 hour  Intake          1196.52 ml  Output              750 ml  Net           446.52 ml   Filed Weights   11/01/16 0755 11/02/16 0659 11/03/16 0400  Weight: 220 lb (99.8 kg) 224 lb 10.4 oz (101.9 kg) 224 lb 3.2 oz (101.7 kg)    Physical Exam    GEN: Well nourished, obese, in no acute distress.  HEENT: Grossly normal.  Neck: Supple Cardiac: RRR, groin with no hematoma or bruit bilaterally Respiratory:  CTA GI: Soft, nontender, nondistended. MS: no  deformity or atrophy. Skin: warm and dry, no rash. Neuro:  Strength and sensation are intact. Psych: AAOx3.  Normal affect.  Labs    CBC  Recent Labs  11/02/16 0337 11/04/16 0217  WBC 8.4 7.1  HGB 13.0 13.0  HCT 39.4 39.3  MCV 91.6 91.0  PLT 199 [id number]   Basic Metabolic Panel  Recent Labs  11/01/16 1030 11/02/16 0337 11/03/16 0931  NA 139 137  --   K 4.4 3.5 3.7  CL 102 104  --   CO2 21* 26  --   GLUCOSE 228* 159*  --   BUN 16 9  --   CREATININE 0.86 0.65  --   CALCIUM 10.4* 8.4*  --   MG  --   --  1.9   Liver Function Tests  Recent Labs  11/01/16 1030  AST 31  ALT QUANTITY NOT SUFFICIENT, UNABLE TO PERFORM TEST  ALKPHOS 69  BILITOT QUANTITY NOT SUFFICIENT, UNABLE TO PERFORM TEST  PROT 6.9  ALBUMIN 4.1   Cardiac Enzymes  Recent Labs  11/03/16 1249 11/03/16 1858 11/04/16 0130  TROPONINI 8.55* 6.50* 6.81*   Hemoglobin A1C  Recent Labs  11/01/16 1059  HGBA1C 7.8*   Thyroid Function Tests  Recent Labs  11/01/16 1030  TSH 2.608    Telemetry    Sinus with PVCs- Personally Reviewed   Radiology    Dg Chest Port 1 View  Result Date: 11/03/2016 CLINICAL DATA:  Central line placement.  Initial encounter. EXAM: PORTABLE CHEST 1 VIEW COMPARISON:  Chest radiograph performed 11/01/2016 FINDINGS: The patient's right IJ line is noted ending about the mid SVC. The lungs are well-aerated and clear. There is no evidence of focal opacification, pleural effusion or pneumothorax. The cardiomediastinal silhouette is within normal limits. No acute osseous abnormalities are seen. External pacing pads are noted. IMPRESSION: 1. Right IJ line noted ending about the mid SVC. 2. No acute cardiopulmonary process seen. Electronically Signed   By: Garald Balding M.D.   On: 11/03/2016 16:24    Patient Profile     60 year old female with past medical history of diabetes mellitus, hypertension and hyperlipidemia admitted with acute inferior infarct. Patient had PCI of  her right coronary artery which was later complicated by acute stent thrombosis requiring repeat intervention. She then developed symptomatic nonsustained ventricular tachycardia and was placed on lidocaine and amiodarone. Echocardiogram shows ejection fraction 35-40%.   Assessment & Plan    1 Acute inferior myocardial infarction status post PCI of right coronary artery followed by acute stent thrombosis requiring repeat intervention-continue aspirin, brilinta and statin. Continue metoprolol. Discontinue IV heparin.   2 ischemic cardiomyopathy-ejection fraction 35-40%. Increase Toprol to 25 mg daily. Blood pressure will not allow an ACE inhibitor at this point but we will consider in the future.  3 nonsustained ventricular tachycardia--improved on telemetry this AM. This is likely related to recent infarct and will hopefully continue to improve over next 24 hours. I will discontinue lidocaine and amiodarone and follow on telemetry. Increase Toprol to 25 mg daily.  4 Hyperlipidemia-continue Lipitor. Check lipids and liver in 4 weeks.   5 diabetes mellitus-follow CBG.  Watch in CCU today.  Signed, Kirk Ruths, MD  11/04/2016, 7:53 AM

## 2016-11-05 ENCOUNTER — Encounter (HOSPITAL_COMMUNITY): Payer: Self-pay | Admitting: Physician Assistant

## 2016-11-05 ENCOUNTER — Telehealth: Payer: Self-pay | Admitting: Physician Assistant

## 2016-11-05 DIAGNOSIS — I4729 Other ventricular tachycardia: Secondary | ICD-10-CM

## 2016-11-05 DIAGNOSIS — Z6834 Body mass index (BMI) 34.0-34.9, adult: Secondary | ICD-10-CM

## 2016-11-05 DIAGNOSIS — I255 Ischemic cardiomyopathy: Secondary | ICD-10-CM

## 2016-11-05 DIAGNOSIS — I25118 Atherosclerotic heart disease of native coronary artery with other forms of angina pectoris: Secondary | ICD-10-CM

## 2016-11-05 DIAGNOSIS — I472 Ventricular tachycardia: Secondary | ICD-10-CM

## 2016-11-05 DIAGNOSIS — I2510 Atherosclerotic heart disease of native coronary artery without angina pectoris: Secondary | ICD-10-CM

## 2016-11-05 HISTORY — DX: Ventricular tachycardia: I47.2

## 2016-11-05 HISTORY — DX: Other ventricular tachycardia: I47.29

## 2016-11-05 LAB — ECHOCARDIOGRAM COMPLETE
Height: 60 in
WEIGHTICAEL: 3594.38 [oz_av]

## 2016-11-05 LAB — GLUCOSE, CAPILLARY
GLUCOSE-CAPILLARY: 161 mg/dL — AB (ref 65–99)
Glucose-Capillary: 112 mg/dL — ABNORMAL HIGH (ref 65–99)
Glucose-Capillary: 136 mg/dL — ABNORMAL HIGH (ref 65–99)

## 2016-11-05 LAB — BASIC METABOLIC PANEL
ANION GAP: 5 (ref 5–15)
BUN: 8 mg/dL (ref 6–20)
CHLORIDE: 107 mmol/L (ref 101–111)
CO2: 28 mmol/L (ref 22–32)
CREATININE: 0.75 mg/dL (ref 0.44–1.00)
Calcium: 8.7 mg/dL — ABNORMAL LOW (ref 8.9–10.3)
GFR calc non Af Amer: >60 mL/min (ref >60)
Glucose, Bld: 146 mg/dL — ABNORMAL HIGH (ref 65–99)
Potassium: 3.9 mmol/L (ref 3.5–5.1)
SODIUM: 140 mmol/L (ref 135–145)

## 2016-11-05 LAB — CBC
HCT: 37.1 % (ref 36.0–46.0)
HEMOGLOBIN: 12.4 g/dL (ref 12.0–15.0)
MCH: 30 pg (ref 26.0–34.0)
MCHC: 33.4 g/dL (ref 30.0–36.0)
MCV: 89.8 fL (ref 78.0–100.0)
Platelets: 172 10*3/uL (ref 150–400)
RBC: 4.13 MIL/uL (ref 3.87–5.11)
RDW: 12.7 % (ref 11.5–15.5)
WBC: 5.6 10*3/uL (ref 4.0–10.5)

## 2016-11-05 LAB — POCT ACTIVATED CLOTTING TIME: ACTIVATED CLOTTING TIME: 384 s

## 2016-11-05 MED ORDER — ATORVASTATIN CALCIUM 80 MG PO TABS: 80.0000 mg | ORAL_TABLET | Freq: Every evening | ORAL | 6 refills | Status: DC

## 2016-11-05 MED ORDER — METOPROLOL SUCCINATE ER 25 MG PO TB24: 25.0000 mg | ORAL_TABLET | Freq: Every day | ORAL | 6 refills | Status: DC

## 2016-11-05 MED ORDER — ASPIRIN 81 MG PO TBEC: 81.0000 mg | DELAYED_RELEASE_TABLET | Freq: Every day | ORAL | 3 refills | Status: DC

## 2016-11-05 MED ORDER — NITROGLYCERIN 0.4 MG SL SUBL: 0.4000 mg | SUBLINGUAL_TABLET | SUBLINGUAL | 3 refills | Status: AC | PRN

## 2016-11-05 MED ORDER — LIVING BETTER WITH HEART FAILURE BOOK
Freq: Once | Status: AC
  Administered 2016-11-05: 1

## 2016-11-05 MED ORDER — TICAGRELOR 90 MG PO TABS: 90.0000 mg | ORAL_TABLET | Freq: Two times a day (BID) | ORAL | 3 refills | Status: DC

## 2016-11-05 MED FILL — BRILINTA 90 MG TABLET: 90 | 30 days supply | Qty: 60 | Fill #0

## 2016-11-05 NOTE — Progress Notes (Signed)
Patient Name: TAKIMA NOORDA Date of Encounter: 11/05/2016  Primary Cardiologist: Dr [facility] Problem List     Active Problems:   Acute ST elevation myocardial infarction (STEMI) ([place])   Hyperlipidemia, mixed   Torsades de pointes ([place])   Encounter for central line placement     Subjective   No chest pain or dyspnea  Inpatient Medications    Scheduled Meds: . aspirin EC  81 mg Oral Daily  . atorvastatin  80 mg Oral q1800  . heparin subcutaneous  5,000 Units Subcutaneous Q8H  . insulin aspart  0-15 Units Subcutaneous TID WC  . metoprolol succinate  25 mg Oral Daily  . sodium chloride flush  10-40 mL Intracatheter Q12H  . ticagrelor  90 mg Oral BID   Continuous Infusions: . sodium chloride Stopped (11/02/16 0644)   PRN Meds: acetaminophen, alum & mag hydroxide-simeth, diazepam, docusate sodium, morphine injection, nitroGLYCERIN, oxyCODONE-acetaminophen, promethazine, sodium chloride flush   Vital Signs    Vitals:   11/04/16 2300 11/04/16 2345 11/05/16 0341 11/05/16 0500  BP:  (!) 99/38    Pulse:      Resp: (!) 26 (!) 21 14   Temp:  98.7 F (37.1 C) 97.8 F (36.6 C)   TempSrc:  Oral Oral   SpO2:  96% 96%   Weight:    221 lb 1.9 oz (100.3 kg)  Height:        Intake/Output Summary (Last 24 hours) at 11/05/16 0723 Last data filed at 11/04/16 0800  Gross per 24 hour  Intake             86.4 ml  Output              200 ml  Net           -113.6 ml   Filed Weights   11/02/16 0659 11/03/16 0400 11/05/16 0500  Weight: 224 lb 10.4 oz (101.9 kg) 224 lb 3.2 oz (101.7 kg) 221 lb 1.9 oz (100.3 kg)    Physical Exam    GEN: Well nourished, obese, in no acute distress.  HEENT: Grossly normal.  Neck: Supple Cardiac: RRR, groin with no hematoma or bruit bilaterally Respiratory:  CTA GI: Soft, nontender, nondistended. MS: no deformity or atrophy. Skin: warm and dry, no rash. Neuro:  Strength and sensation are intact. Psych: AAOx3.  Normal  affect.  Labs    CBC  Recent Labs  11/04/16 0217 11/05/16 0433  WBC 7.1 5.6  HGB 13.0 12.4  HCT 39.3 37.1  MCV 91.0 89.8  PLT 167 [id number]   Basic Metabolic Panel  Recent Labs  11/03/16 0931 11/05/16 0433  NA  --  140  K 3.7 3.9  CL  --  107  CO2  --  28  GLUCOSE  --  146*  BUN  --  8  CREATININE  --  0.75  CALCIUM  --  8.7*  MG 1.9  --    Cardiac Enzymes  Recent Labs  11/03/16 1249 11/03/16 1858 11/04/16 0130  TROPONINI 8.55* 6.50* 6.81*     Telemetry    Sinus- Personally Reviewed   Radiology    Dg Chest Port 1 View  Result Date: 11/03/2016 CLINICAL DATA:  Central line placement.  Initial encounter. EXAM: PORTABLE CHEST 1 VIEW COMPARISON:  Chest radiograph performed 11/01/2016 FINDINGS: The patient's right IJ line is noted ending about the mid SVC. The lungs are well-aerated and clear. There is no evidence of focal opacification, pleural effusion or  pneumothorax. The cardiomediastinal silhouette is within normal limits. No acute osseous abnormalities are seen. External pacing pads are noted. IMPRESSION: 1. Right IJ line noted ending about the mid SVC. 2. No acute cardiopulmonary process seen. Electronically Signed   By: Garald Balding M.D.   On: 11/03/2016 16:24    Patient Profile     60 year old female with past medical history of diabetes mellitus, hypertension and hyperlipidemia admitted with acute inferior infarct. Patient had PCI of her right coronary artery which was later complicated by acute stent thrombosis requiring repeat intervention. She then developed symptomatic nonsustained ventricular tachycardia and was placed on lidocaine and amiodarone. Echocardiogram shows ejection fraction 35-40%.   Assessment & Plan    1 Acute inferior myocardial infarction status post PCI of right coronary artery followed by acute stent thrombosis requiring repeat intervention-continue aspirin, brilinta and statin. Continue metoprolol.   2 ischemic  cardiomyopathy-ejection fraction 35-40%. ContinueToprol 25 mg daily. Blood pressure will not allow an ACE inhibitor at this point but we will consider as outpt.  3 nonsustained ventricular tachycardia--no further NSVT since yesterday. This was possibly related to recent infarct but did occur 36 hrs later. Continue Toprol 25 mg daily. I discussed pt with Dr Caryl Comes. He feels pt should have life vest; repeat echo 30 days from now; if EF>35 and no shocks, could DC life vest at that point.  4 Hyperlipidemia-continue Lipitor. Check lipids and liver in 4 weeks.   5 diabetes mellitus-follow CBG.  DC home once life vest in place > 30 min PA and physician time D2  Signed, Kirk Ruths, MD  11/05/2016, 7:23 AM

## 2016-11-05 NOTE — Telephone Encounter (Signed)
TOC per Sharrell Ku   11/12/2016     10:30 AM

## 2016-11-05 NOTE — Discharge Summary (Signed)
Discharge Summary    Patient ID: Karen Hartman,  MRN: [id number], DOB/AGE: [birth year] 60 y.o.  Admit date: 11/01/2016 Discharge date: 11/05/2016  Primary Care Provider: Arnette Norris Primary Cardiologist: Admitted by Dr. Burt Knack this admission - long-term, patient requests follow-up in [place] office.  Discharge Diagnoses    Principal Problem:   Acute ST elevation myocardial infarction (STEMI) ([place]) Active Problems:   CAD in native artery   Diabetes ([place])   Hyperlipidemia, mixed   Torsades de pointes ([place])   Encounter for central line placement   NSVT (nonsustained ventricular tachycardia) ([facility])   Cardiomyopathy, ischemic   Morbid obesity ([place])    Diagnostic Studies/Procedures    1. LHC 11/01/16 Narrative & Impression    1. Acute inferolateral STEMI secondary to thrombotic occlusion of the right coronary artery, treated successfully with primary PCI using a drug-eluting stent 2. Moderate nonobstructive LAD stenosis 3. Mild nonobstructive left circumflex stenosis  Recommendations:  Dual antiplatelet therapy with aspirin and brilinta for 12 months  Initiate post MI medical therapy  Patient has good prognostic signs with minimal LV dysfunction and good ST segment resolution post PCI. If she is clinically stable it would be reasonable to consider hospital discharge tomorrow afternoon  Tobacco cessation counseling   2. Repeat LHC 11/01/16 Narrative & Impression    Acute stent thrombosis - treated successfully with aspiration thrombectomy, angiojet thrombectomy, and PTCA, guided by intravascular ultrasound  Recommend:  Pt re-loaded with brilinta 180 mg in the cath lab  Continue aggrastat x 18 hours  Keep in CCU tomorrow, consider transfer out Monday 12-25  If no further complication, DC home [id number]   3. 2D Echo 11/02/16 Study Conclusions - Left ventricle: The cavity size was normal. Systolic function was   moderately to severely reduced. The estimated  ejection fraction   was in the range of 30% to 35%. Wall motion was normal; there   were no regional wall motion abnormalities. Doppler parameters   are consistent with abnormal left ventricular relaxation (grade 1   diastolic dysfunction). Doppler parameters are consistent with   elevated ventricular end-diastolic filling pressure. - Ventricular septum: Septal motion showed paradox. - Aortic valve: Trileaflet; normal thickness leaflets. There was no   regurgitation. - Aortic root: The aortic root was normal in size. - Mitral valve: Structurally normal valve. There was mild   regurgitation. - Left atrium: The atrium was normal in size. - Right ventricle: The cavity size was moderately dilated. Wall   thickness was normal. Systolic function was normal. - Tricuspid valve: There was mild regurgitation. - Pulmonary arteries: Systolic pressure was within the normal   range. - Inferior vena cava: The vessel was normal in size. - Pericardium, extracardiac: There was no pericardial effusion. - Impressions: There is akinesis of the basal and mid inferoseptal,   inferior, inferolateral walls, and apical inferior and septal   walls. Overall LVEF is moderately decreased estimated at 30-35%.   There is moderate RV dysfunction.  Impressions:  - There is akinesis of the basal and mid inferoseptal, inferior,   inferolateral walls, and apical inferior and septal walls.   Overall LVEF is moderately decreased estimated at 30-35%. There   is moderate RV dysfunction.  _____________     History of Present Illness     Karen Hartman (pronounced crosh, like crow) is a 60 y.o. female with history of morbid obesity, DM (previously treated with weight loss), tobacco abuse, vulvar cancer s/p prior surgery who presented to [facility] ED  with chest pain ongoing for approximately 8 hours, associated with belching and nausea. A code STEMI was activated in the emergency room.   Hospital Course     She was treated with aspirin, IV heparin, morphine and Zofran and taken emergently to the cath lab. Her inferolateral STEMI was felt secondary to thrombotic occlusion of the right coronary artery, treated successfully with primary PCI using a drug-eluting stent. She had moderate nonobstructive LAD stenosis and mild nonobstructive LCx stenosis, EF 50-55%. She was initiated on DAPT with ASA/Brilinta. She initially had good ST segment resolution after the procedure. However, later that morning she developed recurrent severe chest pain, diaphoresis, and ST elevation concerning for acute stent thrombosis. She was treated with Aggrestat and taken back to the cath lab urgently which showed acute stent thrombosis which was treated successfully with aspiration thrombectomy, angiojet thrombectomy, and PTCA, guided by intravascular ultrasound. She was reloaded with an additional 180mg  of Brilinta, continued on Aggrestat for 18 hours. Her troponin peaked at 29. 2D echo 11/02/16 showed EF 30-35%, grade 1 DD, mild MR, moderately dilated RV, moderate RV dysfunction, akinesis of the basal and mid inferoseptal,  inferior, inferolateral walls, and apical inferior and septal walls. Her blood pressure prohibited aggressive medical therapy of LV dysfunction. On 11/03/16 she was noted to have runs of NSVT as well as Torsades (18 seconds) on telemetry. Her Mg was 1.9, prompting supplementation. She was started on heparin, amiodarone, and lidocaine. The arrhythmia was felt to be related to recent infarct with hopes of continued improvement over the next 24 hours. On 11/04/16 she was taken off amiodarone and lidocaine and followed on telemetry. Toprol was titrated. From that time onward she had no further NSVT. Dr. Stanford Breed reviewed with Dr. Caryl Comes who felt this was likely related to recent infarct but did occur 36 hours later which is why they decided on placing a Lifevest. Per his recommendation would repeat echo 30 days from now - if EF  >35 and no shocks, could discontinue Lifevest at that time. Otherwise lifestyle modification was recommended - tobacco cessation advised, along with OP f/u PCP for A1C 7.8. She was advised not to drive or return to work until cleared. Dr. Stanford Breed has seen and examined the patient today and feels she is stable for discharge. I also reviewed low sodium diet, daily weights, 2L restriction with patient prior to DC.   She will be discharged after her Lifevest is fitted. I was concerned about the patient being discharged this evening and not having the ability to fill her Brilinta rx before her usual pharmacy closes, since she has a dose due later tonight. I spoke with our on-campus pharmacy and they were able to take a verbal order for the 30-day free prescription and will fill it with the 30-day free card. One of our cath lab nurses will be delivering it to patient's bedside before the patient leaves the hospital. Ms. Hagelstein would like to follow up in [place] long term - there were no 7 day TOC visits available during that timeframe so she was willing to come to [facility] for her first post-hospital f/u. If the patient is tolerating statin at time of follow-up appointment, would consider rechecking liver function/lipid panel in 4 weeks. At follow-up appointment will need to arrange the repeat echo as mentioned above, as long as she has not had any further complications. _____________  Discharge Vitals Blood pressure 118/72, pulse 71, temperature 98.4 F (36.9 C), temperature source Oral, resp. rate 14, height 5' (  1.524 m), weight 221 lb 1.9 oz (100.3 kg), SpO2 97 %.  Filed Weights   11/02/16 0659 11/03/16 0400 11/05/16 0500  Weight: 224 lb 10.4 oz (101.9 kg) 224 lb 3.2 oz (101.7 kg) 221 lb 1.9 oz (100.3 kg)    Labs & Radiologic Studies    CBC  Recent Labs  11/04/16 0217 11/05/16 0433  WBC 7.1 5.6  HGB 13.0 12.4  HCT 39.3 37.1  MCV 91.0 89.8  PLT 167 Q000111Q   Basic Metabolic Panel  Recent  Labs  11/03/16 0931 11/05/16 0433  NA  --  140  K 3.7 3.9  CL  --  107  CO2  --  28  GLUCOSE  --  146*  BUN  --  8  CREATININE  --  0.75  CALCIUM  --  8.7*  MG 1.9  --    Cardiac Enzymes  Recent Labs  11/03/16 1249 11/03/16 1858 11/04/16 0130  TROPONINI 8.55* 6.50* 6.81*  _____________  Dg Chest Port 1 View  Result Date: 11/03/2016 CLINICAL DATA:  Central line placement.  Initial encounter. EXAM: PORTABLE CHEST 1 VIEW COMPARISON:  Chest radiograph performed 11/01/2016 FINDINGS: The patient's right IJ line is noted ending about the mid SVC. The lungs are well-aerated and clear. There is no evidence of focal opacification, pleural effusion or pneumothorax. The cardiomediastinal silhouette is within normal limits. No acute osseous abnormalities are seen. External pacing pads are noted. IMPRESSION: 1. Right IJ line noted ending about the mid SVC. 2. No acute cardiopulmonary process seen. Electronically Signed   By: Garald Balding M.D.   On: 11/03/2016 16:24   Dg Chest Port 1 View  Result Date: 11/01/2016 CLINICAL DATA:  Mid chest pain since last night. EXAM: PORTABLE CHEST 1 VIEW COMPARISON:  None. FINDINGS: Lungs are adequately inflated without consolidation, effusion or pneumothorax. Cardiomediastinal silhouette is within normal. There is mild calcified plaque over the aortic arch. Bones and soft tissues are normal. IMPRESSION: No acute cardiopulmonary disease. Aortic atherosclerosis. Electronically Signed   By: Marin Olp M.D.   On: 11/01/2016 08:35   Disposition   Pt is being discharged home today in good condition.  Follow-up Plans & Appointments    Follow-up Information    Arnette Norris, MD Follow up.   Specialty:  Family Medicine Why:  Your A1C was 7.8 which is too high. Follow up with your primary care provider for stricter control of your diabetes. Contact information: [place] [address] [phone]        Charlie Pitter, PA-C Follow up.     Specialties:  Cardiology, Radiology Why:  11/12/16 at 10:30am - This will be at the [facility] office in [place] (due to appointment availability) - your subsequent follow-ups will be in the [place] office as requested. Contact information: [address] [place] [address] [phone]          Discharge Instructions    Amb Referral to Cardiac Rehabilitation    Complete by:  As directed    Diagnosis:   Coronary Stents STEMI     Diet - low sodium heart healthy    Complete by:  As directed    Increase activity slowly    Complete by:  As directed    No driving until cleared by your cardiologist. This typically occurs after you have been released from the LifeVest. No lifting over 10 lbs for 4 weeks. No sexual activity for 4 weeks. You may not return  to work until cleared by your cardiologist. Keep procedure site clean & dry. If you notice increased pain, swelling, bleeding or pus, call/return!  You may shower, but no soaking baths/hot tubs/pools for 1 week.   Patients taking blood thinners should generally stay away from medicines like ibuprofen, Advil, Motrin, naproxen, and Aleve due to risk of stomach bleeding. You may take Tylenol as directed or talk to your primary doctor about alternatives.  One of your heart tests showed weakness of the heart muscle this admission. This may make you more susceptible to weight gain from fluid retention, which can lead to symptoms that we call heart failure. Please follow these special instructions:  1. Follow a low-salt diet and watch your fluid intake. In general, you should not be taking in more than 2 liters of fluid per day (no more than 8 glasses per day). Some patients are restricted to less than 1.5 liters of fluid per day (no more than 6 glasses per day). This includes sources of water in foods like soup, coffee, tea, milk, etc. 2. Weigh yourself on the same scale at same time of day and keep a log. 3. Call your  doctor: (Anytime you feel any of the following symptoms)  - 3-4 pound weight gain in 1-2 days or 2 pounds overnight  - Shortness of breath, with or without a dry hacking cough  - Swelling in the hands, feet or stomach  - If you have to sleep on extra pillows at night in order to breathe   IT IS IMPORTANT TO LET YOUR DOCTOR KNOW EARLY ON IF YOU ARE HAVING SYMPTOMS SO WE CAN HELP YOU!      Discharge Medications   Allergies as of 11/05/2016   No Known Allergies     Medication List    STOP taking these medications   ibuprofen 200 MG tablet Commonly known as:  ADVIL,MOTRIN     TAKE these medications   acetaminophen 500 MG tablet Commonly known as:  TYLENOL Take 500 mg by mouth every 6 (six) hours as needed for mild pain.   aspirin 81 MG EC tablet Take 1 tablet (81 mg total) by mouth daily. Start taking on:  11/06/2016   atorvastatin 80 MG tablet Commonly known as:  LIPITOR Take 1 tablet (80 mg total) by mouth every evening.   calcium carbonate 500 MG chewable tablet Commonly known as:  TUMS - dosed in mg elemental calcium Chew 1 tablet by mouth daily as needed for indigestion or heartburn.   docusate sodium 100 MG capsule Commonly known as:  COLACE Take 100 mg by mouth every other day.   metoprolol succinate 25 MG 24 hr tablet Commonly known as:  TOPROL-XL Take 1 tablet (25 mg total) by mouth daily. Start taking on:  11/06/2016   nitroGLYCERIN 0.4 MG SL tablet Commonly known as:  NITROSTAT Place 1 tablet (0.4 mg total) under the tongue every 5 (five) minutes as needed for chest pain (up to 3 doses).   ticagrelor 90 MG Tabs tablet Commonly known as:  BRILINTA Take 1 tablet (90 mg total) by mouth 2 (two) times daily.        Allergies:  No Known Allergies  Aspirin prescribed at discharge?  Yes High Intensity Statin Prescribed? (Lipitor 40-80mg  or Crestor 20-40mg ): Yes Beta Blocker Prescribed? Yes For EF <40%, was ACEI/ARB Prescribed? No: hypotension ADP  Receptor Inhibitor Prescribed? (i.e. Plavix etc.-Includes Medically Managed Patients): Yes For EF <40%, Aldosterone Inhibitor Prescribed? No: hypotension Was EF assessed  during THIS hospitalization? Yes Was Cardiac Rehab II ordered? (Included Medically managed Patients): Yes   Outstanding Labs/Studies   Will need echo as above.  Duration of Discharge Encounter   Greater than 30 minutes including physician time.  Signed, Nedra Hai Dunn PA-C 11/05/2016, 1:42 PM

## 2016-11-05 NOTE — Progress Notes (Signed)
Work note given to patient.

## 2016-11-05 NOTE — Progress Notes (Signed)
All D/C instruction done by previous RN

## 2016-11-05 NOTE — Progress Notes (Signed)
CARDIAC REHAB PHASE I   PRE:  Rate/Rhythm: 82 SR   BP:  Supine:   Sitting: 112/59  Standing:    SaO2: 98%RA  MODE:  Ambulation: 470 ft   POST:  Rate/Rhythm: 106 ST  BP:  Supine:   Sitting: 124/65  Standing:    SaO2: 100%RA 1105-1132 Pt walked 470 ft with steady gait. No CP. Gave pt CHF booklet and reviewed zones. Discussed importance of daily weights, 2000 mg sodium restriction, and signs/symptoms when to call MD. Reviewed ex ed. Referring to CRP 2 [place]. Pt voiced understanding of ed. Has read materials from ed done yesterday.   Graylon Good, RN BSN  11/05/2016 11:27 AM

## 2016-11-06 NOTE — Telephone Encounter (Signed)
Patient contacted regarding discharge from [facility] on November 05, 2016.  Patient understands to follow up with provider Melina Copa, PA-C on November 12, 2016 at 10:30A at [facility] location. Patient understands discharge instructions? yes Patient understands medications and regiment? yes Patient understands to bring all medications to this visit? yes

## 2016-11-06 NOTE — Care Management Note (Signed)
Case Management Note  Patient Details  Name: Karen Hartman MRN: [id number] Date of Birth: 05-27-1956  Subjective/Objective:   Pt lives with spouse, will be discharged on [place] and with life vest.  Provided cards for Brillinta 30-day free supply and $18/month. Assisting [employer] rep with life vest documentation required by [employer].                             Expected Discharge Plan:  Home/Self Care  Discharge planning Services  CM Consult  Post Acute Care Choice:  Durable Medical Equipment  DME Arranged: Life Vest DME Agency:  [employer]  Status of Service:  Completed, signed off  Girard Cooter, [place] 11/06/2016, 8:43 AM

## 2016-11-07 ENCOUNTER — Telehealth: Payer: Self-pay

## 2016-11-07 NOTE — Telephone Encounter (Signed)
Transition Care Management Follow-up Telephone Call   Date discharged? 11/05/16   How have you been since you were released from the hospital? "very well"   Do you understand why you were in the hospital? yes   Do you understand the discharge instructions? no   Where were you discharged to? Home   Items Reviewed:  Medications reviewed: yes  Allergies reviewed: yes  Dietary changes reviewed: yes  Referrals reviewed: yes   Functional Questionnaire:   Activities of Daily Living (ADLs):   She states they are independent in the following: ambulation, bathing and hygiene, feeding, continence, grooming, toileting and dressing States they require assistance with the following: grooming and none   Any transportation issues/concerns?: no   Any patient concerns? no   Confirmed importance and date/time of follow-up visits scheduled yes, appt with cardiology. Will call back for appt with PCP  Provider Appointment booked with cardiology. Declined scheduling with PCP at this time.   Confirmed with patient if condition begins to worsen call PCP or go to the ER.  Patient was given the office number and encouraged to call back with question or concerns.  : yes

## 2016-11-11 NOTE — Progress Notes (Signed)
Cardiology Office Note    Date:  11/12/2016  ID:  Karen, Hartman 07/22/1956, MRN [id number] PCP:  Arnette Norris, MD  Cardiologist:  Admitted by Dr. Burt Knack this admission - long-term, patient requests follow-up in [place] office.   Chief Complaint: f/u recent MI  History of Present Illness:  Karen Hartman is a 61 y.o. female with history of morbid obesity, DM (previously treated with weight loss), tobacco abuse, vulvar cancer s/p prior surgery, recently diagnosed CAD with inf-lat STEMI 10/2016 s/p PCI to RCA who presents for f/u. During recent admission for STEMI she was found to have thrombotic occlusion of the right coronary artery, treated successfully with primary PCI using a drug-eluting stent. She had moderate nonobstructive LAD stenosis and mild nonobstructive LCx stenosis, EF 50-55%. She was initiated on DAPT with ASA/Brilinta. She initially had good ST segment resolution after the procedure. However, later that morning she developed recurrent severe chest pain, diaphoresis, and ST elevation concerning for acute stent thrombosis. She was treated with Aggrestat and taken back to the cath lab urgently which showed acute stent thrombosis which was treated successfully with aspiration thrombectomy, angiojet thrombectomy, and PTCA, guided by intravascular ultrasound. She was reloaded with an additional 180mg  of Brilinta, continued on Aggrestat for 18 hours. It was felt this event possibly happened due to her delay in digesting Brilinta. Her troponin peaked at 29. 2D echo 11/02/16 showed EF 30-35%, grade 1 DD, mild MR, moderately dilated RV, moderate RV dysfunction, akinesis of the basal and mid inferoseptal,  inferior, inferolateral walls, and apical inferior and septal walls. Her soft blood pressure prohibited aggressive medical therapy of LV dysfunction. On 11/03/16 she was noted to have runs of NSVT as well as Torsades (18 seconds) on telemetry. She was temporarily treated with heparin,  amiodarone and lidocaine. The arrhythmia was felt to be related to recent infarct and improved over the next several hours. However, given that this arrhythmia occurred 36 hours after initial event, LifeVest was recommended with repeat echo 30 days - if EF >35 and no shocks, could discontinue Lifevest at that time. Labs otherwise notable for A1C 7.8, LDL 160, CMET with mildly elevated calcium and insufficient sample for LFTs, CBC wnl.  She presents for follow-up today. She is feeling well without any complaints (other than how cumbersome the Lifevest is). No chest pain, SOB, LEE, shocks, syncope, palpitations. States she didn't realize how bad she felt for the last few months, since she feels so good now. Remains abstinent of tobacco.   Past Medical History:  Diagnosis Date  . CAD in native artery    a. inf/lat STEMI 10/2016 with occluded RCA s/p DES, moderate nonobstructive LAD stenosis and mild nonobstructive LCx stenosis, EF 50-55% -> taken back to cath lab for acute stent thrombosis s/p thromectomy/PTCA of RCA same day. EF 30-35% by echo 11/02/16.   . Diabetes ([place])   . Hyperlipidemia, mixed 11/01/2016  . Ischemic cardiomyopathy   . Morbid obesity ([place])   . NSVT (nonsustained ventricular tachycardia) ([place])    a. during adm 10/2016 for STEMI.  Marland Kitchen STEMI involving right coronary artery ([place]) 11/01/2016  . Tobacco abuse   . Torsades de pointes ([place])    a. during adm 10/2016 for STEMI, felt infarct related.  . Vulvar cancer [facility])     Past Surgical History:  Procedure Laterality Date  . CARDIAC CATHETERIZATION N/A 11/01/2016   Procedure: Left Heart Cath and Coronary Angiography;  Surgeon: Sherren Mocha, MD;  Location: [place] [facility];  Service: Cardiovascular;  Laterality: N/A;  . CARDIAC CATHETERIZATION N/A 11/01/2016   Procedure: Coronary Stent Intervention;  Surgeon: Sherren Mocha, MD;  Location: [place] [facility];  Service: Cardiovascular;  Laterality: N/A;  DES to Prox RCA- [place]  3.5x28  . CARDIAC CATHETERIZATION N/A 11/01/2016   Procedure: Left Heart Cath and Coronary Angiography;  Surgeon: Sherren Mocha, MD;  Location: [place] [facility];  Service: Cardiovascular;  Laterality: N/A;  . CARDIAC CATHETERIZATION N/A 11/01/2016   Procedure: Intravascular Ultrasound/IVUS;  Surgeon: Sherren Mocha, MD;  Location: [place] [facility];  Service: Cardiovascular;  Laterality: N/A;  . CARDIAC CATHETERIZATION N/A 11/01/2016   Procedure: Temporary Pacemaker;  Surgeon: Sherren Mocha, MD;  Location: [place] [facility];  Service: Cardiovascular;  Laterality: N/A;  . CESAREAN SECTION    . LAMINECTOMY  1986  . PERIPHERAL VASCULAR CATHETERIZATION  11/01/2016   Procedure: Thrombectomy;  Surgeon: Sherren Mocha, MD;  Location: [place] [facility];  Service: Cardiovascular;;  . TONSILLECTOMY  1981  . WISDOM TOOTH EXTRACTION      Current Medications: Current Outpatient Prescriptions  Medication Sig Dispense Refill  . acetaminophen (TYLENOL) 500 MG tablet Take 500 mg by mouth every 6 (six) hours as needed for mild pain.    Marland Kitchen aspirin EC 81 MG EC tablet Take 1 tablet (81 mg total) by mouth daily. 90 tablet 3  . atorvastatin (LIPITOR) 80 MG tablet Take 1 tablet (80 mg total) by mouth every evening. 30 tablet 6  . calcium carbonate (TUMS - DOSED IN MG ELEMENTAL CALCIUM) 500 MG chewable tablet Chew 1 tablet by mouth daily as needed for indigestion or heartburn.    . docusate sodium (COLACE) 100 MG capsule Take 100 mg by mouth every other day.     . metoprolol succinate (TOPROL-XL) 25 MG 24 hr tablet Take 1 tablet (25 mg total) by mouth daily. 30 tablet 6  . nitroGLYCERIN (NITROSTAT) 0.4 MG SL tablet Place 1 tablet (0.4 mg total) under the tongue every 5 (five) minutes as needed for chest pain (up to 3 doses). 25 tablet 3  . ticagrelor (BRILINTA) 90 MG TABS tablet Take 1 tablet (90 mg total) by mouth 2 (two) times daily. 180 tablet 3   No current facility-administered medications for this  visit.      Allergies:   Patient has no known allergies.   Social History   Social History  . Marital status: Married    Spouse name: N/A  . Number of children: N/A  . Years of education: N/A   Social History Main Topics  . Smoking status: Former Smoker    Packs/day: 0.50    Types: Cigarettes  . Smokeless tobacco: Never Used  . Alcohol use No  . Drug use: No  . Sexual activity: Yes   Other Topics Concern  . None   Social History Narrative   RN at [employer]   Married     Family History:  The patient's family history includes Heart disease in her mother; Uterine cancer in her mother.   ROS:   Please see the history of present illness.  All other systems are reviewed and otherwise negative.    PHYSICAL EXAM:   VS:  BP 124/70   Pulse (!) 58   Ht 5\' 5"  (1.651 m)   Wt 224 lb 6.4 oz (101.8 kg)   BMI 37.34 kg/m   BMI: Body mass index is 37.34 kg/m. GEN: Well nourished, well developed WF, in no acute distress  HEENT: normocephalic, atraumatic Neck: no JVD, carotid bruits, or masses Cardiac: RRR; no murmurs, rubs, or gallops, no edema  Respiratory:  clear to auscultation bilaterally, normal work of breathing GI: soft, nontender, nondistended, + BS MS: no deformity or atrophy  Skin: warm and dry, no rash. Lifevest in place. Right groin cath site without hematoma, ecchymosis, or bruit. Left groin with very small hematoma, no bruit or ecchymosis. Neuro:  Alert and Oriented x 3, Strength and sensation are intact, follows commands Psych: euthymic mood, full affect  Wt Readings from Last 3 Encounters:  11/12/16 224 lb 6.4 oz (101.8 kg)  11/05/16 221 lb 1.9 oz (100.3 kg)  06/14/15 208 lb (94.3 kg)      Studies/Labs Reviewed:   EKG:  EKG was ordered today and personally reviewed by me and demonstrates sinus bradycardia 58bpm, scooped ST and TWI inferiorly. QTc 455ms. Evolution from prior tracing.  Recent Labs: 11/01/2016: ALT QUANTITY NOT SUFFICIENT, UNABLE TO  PERFORM TEST; B Natriuretic Peptide 92.0; TSH 2.608 11/03/2016: Magnesium 1.9 11/05/2016: BUN 8; Creatinine, Ser 0.75; Hemoglobin 12.4; Platelets 172; Potassium 3.9; Sodium 140   Lipid Panel    Component Value Date/Time   CHOL 236 (H) 11/01/2016 0750   TRIG 98 11/01/2016 0750   HDL 56 11/01/2016 0750   CHOLHDL 4.2 11/01/2016 0750   VLDL 20 11/01/2016 0750   LDLCALC 160 (H) 11/01/2016 0750    Additional studies/ records that were reviewed today include: Summarized above.    ASSESSMENT & PLAN:   1. CAD - doing well. Continue ASA/Brilinta/BB/statin. She declines cardiac rehab at this time. She plans to gradually increase activity at a nearby wellness center. She has small left groin hematoma. There is no ecchymosis or bruit. She feels this has improved since discharge. I have asked her to continue to monitor. If it persists or enlarges, she will let us know at which time I would recommend groin duplex. 2. Ischemic cardiomyopathy - reviewed CHF precautions. Continue BB. Will add low dose ACEI. Recheck labs in 1 week. Her blood pressure prohibited aggressive med titration in the hospital so will need to go slow. Per previously recommended plan, will recheck echo 30 days from 12/27 which is Friday 12/05/16. If she has had no further shocks and EF is >35%, will be able discontinue Lifevest. For now she should continue to wear this. Consider addition of spironolactone at next visit if BP remains stable. 3. NSVT/Torsades - see above.  4. Hyperlipidemia - continue statin. When she returns for labs in 1 week will add repeat baseline LFTs as sample in hospital was not sufficient. Will order f/u lipids/CMET when she returns for echocardiogram as above (~4 weeks). 5. Uncontrolled DM - pt instructed to f/u PCP for further management. 6. Tobacco abuse - she remains abstinent. Counseled on importance on long-term abstinence.  Disposition: F/u with [facility] cardiologist to establish care on/around  12/05/16 to review f/u echocardiogram and med regimen.    Medication Adjustments/Labs and Tests Ordered: Current medicines are reviewed at length with the patient today.  Concerns regarding medicines are outlined above. Medication changes, Labs and Tests ordered today are summarized above and listed in the Patient Instructions accessible in Encounters.   Raechel Ache PA-C  11/12/2016 11:02 AM    [facility] [address], [place], [place]  [address] Phone: [phone]; Fax: [phone]

## 2016-11-12 ENCOUNTER — Encounter (INDEPENDENT_AMBULATORY_CARE_PROVIDER_SITE_OTHER): Payer: Self-pay

## 2016-11-12 ENCOUNTER — Ambulatory Visit (INDEPENDENT_AMBULATORY_CARE_PROVIDER_SITE_OTHER): Payer: BLUE CROSS/BLUE SHIELD | Admitting: Physician Assistant

## 2016-11-12 ENCOUNTER — Encounter: Payer: Self-pay | Admitting: Physician Assistant

## 2016-11-12 VITALS — BP 124/70 | HR 58 | Ht 65.0 in | Wt 224.4 lb

## 2016-11-12 DIAGNOSIS — IMO0002 Reserved for concepts with insufficient information to code with codable children: Secondary | ICD-10-CM

## 2016-11-12 DIAGNOSIS — I255 Ischemic cardiomyopathy: Secondary | ICD-10-CM | POA: Diagnosis not present

## 2016-11-12 DIAGNOSIS — I2510 Atherosclerotic heart disease of native coronary artery without angina pectoris: Secondary | ICD-10-CM | POA: Diagnosis not present

## 2016-11-12 DIAGNOSIS — E782 Mixed hyperlipidemia: Secondary | ICD-10-CM

## 2016-11-12 DIAGNOSIS — I4729 Other ventricular tachycardia: Secondary | ICD-10-CM

## 2016-11-12 DIAGNOSIS — E1165 Type 2 diabetes mellitus with hyperglycemia: Secondary | ICD-10-CM

## 2016-11-12 DIAGNOSIS — Z720 Tobacco use: Secondary | ICD-10-CM

## 2016-11-12 DIAGNOSIS — I472 Ventricular tachycardia: Secondary | ICD-10-CM

## 2016-11-12 DIAGNOSIS — E1159 Type 2 diabetes mellitus with other circulatory complications: Secondary | ICD-10-CM

## 2016-11-12 MED ORDER — LISINOPRIL 5 MG PO TABS: 5.0000 mg | ORAL_TABLET | Freq: Every day | ORAL | 1 refills | Status: DC

## 2016-11-12 NOTE — Patient Instructions (Signed)
Medication Instructions:  1) START LISINOPRIL 5 mg daily  Labwork: You have a lab appointment at the [facility] office 11/21/16 at 9:10AM.  You will also have labs drawn when you have your ECHO.  Testing/Procedures: Your physician has requested that you have an echocardiogram at the [facility] office on or after 12/05/16. Echocardiography is a painless test that uses sound waves to create images of your heart. It provides your doctor with information about the size and shape of your heart and how well your heart's chambers and valves are working. This procedure takes approximately one hour. There are no restrictions for this procedure.  Follow-Up: Please schedule an appointment with one of our [place] doctors to establish care in [place].  Any Other Special Instructions Will Be Listed Below (If Applicable).     If you need a refill on your cardiac medications before your next appointment, please call your pharmacy.

## 2016-11-20 ENCOUNTER — Encounter (HOSPITAL_COMMUNITY): Payer: Self-pay | Admitting: Cardiovascular Disease

## 2016-11-21 ENCOUNTER — Encounter (HOSPITAL_COMMUNITY): Payer: Self-pay | Admitting: Cardiovascular Disease

## 2016-11-21 ENCOUNTER — Other Ambulatory Visit: Payer: PRIVATE HEALTH INSURANCE

## 2016-11-21 ENCOUNTER — Other Ambulatory Visit (INDEPENDENT_AMBULATORY_CARE_PROVIDER_SITE_OTHER): Payer: PRIVATE HEALTH INSURANCE | Admitting: *Deleted

## 2016-11-21 DIAGNOSIS — E782 Mixed hyperlipidemia: Secondary | ICD-10-CM

## 2016-11-21 DIAGNOSIS — I2510 Atherosclerotic heart disease of native coronary artery without angina pectoris: Secondary | ICD-10-CM

## 2016-11-22 LAB — COMPREHENSIVE METABOLIC PANEL
A/G RATIO: 1.8 (ref 1.2–2.2)
ALBUMIN: 4 g/dL (ref 3.5–5.5)
ALT: 16 IU/L (ref 0–32)
AST: 16 IU/L (ref 0–40)
Alkaline Phosphatase: 64 IU/L (ref 39–117)
BILIRUBIN TOTAL: 0.8 mg/dL (ref 0.0–1.2)
BUN / CREAT RATIO: 25 — AB (ref 9–23)
BUN: 20 mg/dL (ref 6–24)
CALCIUM: 9.1 mg/dL (ref 8.7–10.2)
CHLORIDE: 102 mmol/L (ref 96–106)
CO2: 22 mmol/L (ref 18–29)
Creatinine, Ser: 0.79 mg/dL (ref 0.57–1.00)
GFR calc Af Amer: 95 mL/min/{1.73_m2} (ref >59)
GFR, EST NON AFRICAN AMERICAN: 82 mL/min/{1.73_m2} (ref >59)
Globulin, Total: 2.2 g/dL (ref 1.5–4.5)
Glucose: 211 mg/dL — ABNORMAL HIGH (ref 65–99)
POTASSIUM: 4.5 mmol/L (ref 3.5–5.2)
Sodium: 138 mmol/L (ref 134–144)
Total Protein: 6.2 g/dL (ref 6.0–8.5)

## 2016-11-22 LAB — LIPID PANEL
CHOL/HDL RATIO: 2.5 ratio (ref 0.0–4.4)
Cholesterol, Total: 125 mg/dL (ref 100–199)
HDL: 51 mg/dL (ref >39)
LDL Calculated: 63 mg/dL (ref 0–99)
Triglycerides: 57 mg/dL (ref 0–149)
VLDL CHOLESTEROL CAL: 11 mg/dL (ref 5–40)

## 2016-12-05 ENCOUNTER — Other Ambulatory Visit: Payer: Self-pay

## 2016-12-05 ENCOUNTER — Other Ambulatory Visit: Payer: PRIVATE HEALTH INSURANCE

## 2016-12-05 ENCOUNTER — Other Ambulatory Visit: Payer: Self-pay | Admitting: *Deleted

## 2016-12-05 ENCOUNTER — Ambulatory Visit (INDEPENDENT_AMBULATORY_CARE_PROVIDER_SITE_OTHER): Payer: PRIVATE HEALTH INSURANCE

## 2016-12-05 ENCOUNTER — Other Ambulatory Visit
Admission: RE | Admit: 2016-12-05 | Discharge: 2016-12-05 | Disposition: A | Discharge status: Home / Self Care | Payer: BLUE CROSS/BLUE SHIELD | Source: Ambulatory Visit | Attending: Physician Assistant | Admitting: Physician Assistant

## 2016-12-05 DIAGNOSIS — I4729 Other ventricular tachycardia: Secondary | ICD-10-CM

## 2016-12-05 DIAGNOSIS — I472 Ventricular tachycardia: Secondary | ICD-10-CM

## 2016-12-05 DIAGNOSIS — E782 Mixed hyperlipidemia: Secondary | ICD-10-CM | POA: Diagnosis not present

## 2016-12-05 DIAGNOSIS — I2510 Atherosclerotic heart disease of native coronary artery without angina pectoris: Secondary | ICD-10-CM

## 2016-12-05 DIAGNOSIS — I255 Ischemic cardiomyopathy: Secondary | ICD-10-CM | POA: Diagnosis not present

## 2016-12-05 LAB — COMPREHENSIVE METABOLIC PANEL
ALBUMIN: 4.2 g/dL (ref 3.5–5.0)
ALK PHOS: 67 U/L (ref 38–126)
ALT: 26 U/L (ref 14–54)
ANION GAP: 7 (ref 5–15)
AST: 23 U/L (ref 15–41)
BILIRUBIN TOTAL: 0.8 mg/dL (ref 0.3–1.2)
BUN: 20 mg/dL (ref 6–20)
CALCIUM: 9.3 mg/dL (ref 8.9–10.3)
CO2: 27 mmol/L (ref 22–32)
CREATININE: 0.77 mg/dL (ref 0.44–1.00)
Chloride: 104 mmol/L (ref 101–111)
GFR calc Af Amer: >60 mL/min (ref >60)
GFR calc non Af Amer: >60 mL/min (ref >60)
GLUCOSE: 227 mg/dL — AB (ref 65–99)
Potassium: 4.7 mmol/L (ref 3.5–5.1)
SODIUM: 138 mmol/L (ref 135–145)
Total Protein: 7.1 g/dL (ref 6.5–8.1)

## 2016-12-09 DIAGNOSIS — I2109 ST elevation (STEMI) myocardial infarction involving other coronary artery of anterior wall: Secondary | ICD-10-CM | POA: Diagnosis not present

## 2016-12-11 ENCOUNTER — Encounter: Payer: Self-pay | Admitting: Cardiology

## 2016-12-11 ENCOUNTER — Ambulatory Visit (INDEPENDENT_AMBULATORY_CARE_PROVIDER_SITE_OTHER): Payer: BLUE CROSS/BLUE SHIELD | Admitting: Cardiology

## 2016-12-11 VITALS — BP 132/72 | HR 63 | Ht 65.0 in | Wt 225.2 lb

## 2016-12-11 DIAGNOSIS — I255 Ischemic cardiomyopathy: Secondary | ICD-10-CM

## 2016-12-11 DIAGNOSIS — Z9861 Coronary angioplasty status: Secondary | ICD-10-CM

## 2016-12-11 DIAGNOSIS — I2510 Atherosclerotic heart disease of native coronary artery without angina pectoris: Secondary | ICD-10-CM | POA: Diagnosis not present

## 2016-12-11 DIAGNOSIS — I472 Ventricular tachycardia: Secondary | ICD-10-CM | POA: Diagnosis not present

## 2016-12-11 DIAGNOSIS — IMO0001 Reserved for inherently not codable concepts without codable children: Secondary | ICD-10-CM

## 2016-12-11 DIAGNOSIS — I4729 Other ventricular tachycardia: Secondary | ICD-10-CM

## 2016-12-11 DIAGNOSIS — Z6837 Body mass index (BMI) 37.0-37.9, adult: Secondary | ICD-10-CM

## 2016-12-11 DIAGNOSIS — E6609 Other obesity due to excess calories: Secondary | ICD-10-CM

## 2016-12-11 DIAGNOSIS — R9431 Abnormal electrocardiogram [ECG] [EKG]: Secondary | ICD-10-CM

## 2016-12-11 DIAGNOSIS — R002 Palpitations: Secondary | ICD-10-CM

## 2016-12-11 NOTE — Progress Notes (Signed)
Cardiology Office Note   Date:  12/11/2016   ID:  Karen, Hartman 10/09/1956, MRN [id number]  Referring Doctor:  Arnette Norris, MD   Cardiologist:   Wende Bushy, MD   Reason for consultation:  Chief Complaint  Patient presents with  . other    1 month follow up. Discuss Echo and possible LifeVest.  Meds reviewed by the pt. verbally.       History of Present Illness: Karen Hartman is a 61 y.o. female who presents for ffup for CAD  Karen Hartman is a 61 y.o. female with history of morbid obesity, DM (previously treated with weight loss), tobacco abuse, vulvar cancer s/p prior surgery, recently diagnosed CAD with inf-lat STEMI 10/2016 s/p PCI to RCA who presents for f/u. During recent admission for STEMI she was found to have thrombotic occlusion of the right coronary artery, treated successfully with primary PCI using a drug-eluting stent. She had moderate nonobstructive LAD stenosis and mild nonobstructive LCx stenosis, EF 50-55%. She was initiated on DAPT with ASA/Brilinta. She initially had good ST segment resolution after the procedure. However, later that morning she developed recurrent severe chest pain, diaphoresis, and ST elevation concerning for acute stent thrombosis. She was treated with Aggrestat and taken back to the cath lab urgently which showed acute stent thrombosis which was treated successfully with aspiration thrombectomy, angiojet thrombectomy, and PTCA, guided by intravascular ultrasound. She was reloaded with an additional 140m of Brilinta, continued on Aggrestat for 18 hours. It was felt this event possibly happened due to her delay in digesting Brilinta. Her troponin peaked at 29. 2D echo 11/02/16 showed EF 30-35%, grade 1 DD, mild MR, moderately dilated RV, moderate RV dysfunction, akinesis of the basal and mid inferoseptal,  inferior, inferolateral walls, and apical inferior and septal walls. Her soft blood pressure prohibited aggressive medical therapy of  LV dysfunction. On 11/03/16 she was noted to have runs of NSVT as well as Torsades (18 seconds) on telemetry. She was temporarily treated with heparin, amiodarone and lidocaine. The arrhythmia was felt to be related to recent infarct and improved over the next several hours. However, given that this arrhythmia occurred 36 hours after initial event, LifeVest was recommended with repeat echo 30 days - if EF >35 and no shocks, could discontinue Lifevest at that time. Labs otherwise notable for A1C 7.8, LDL 160, CMET with mildly elevated calcium and insufficient sample for LFTs, CBC wnl.  Since last visit, after she received a phone call regarding the follow-up echo and was told that her heart function improved, she decided to stop using the LifeVest. She has had no chest pains no shortness of breath, no lightheadedness, no dizziness. She did not have any shocks what she was on the LifeVest. She thinks that she is being too worried her too anxious about her heart condition. She does not know whether she has palpitations but she keeps pain attention to her heart beat.  She denies PND, orthopnea, edema.  ROS:  Please see the history of present illness. Aside from mentioned under HPI, all other systems are reviewed and negative.     Past Medical History:  Diagnosis Date  . CAD in native artery    a. inf/lat STEMI 10/2016 with occluded RCA s/p DES, moderate nonobstructive LAD stenosis and mild nonobstructive LCx stenosis, EF 50-55% -> taken back to cath lab for acute stent thrombosis s/p thromectomy/PTCA of RCA same day. EF 30-35% by echo 11/02/16.   . Diabetes ([place]   .  Hyperlipidemia, mixed 11/01/2016  . Ischemic cardiomyopathy   . Morbid obesity ([place])   . NSVT (nonsustained ventricular tachycardia) ([place])    a. during adm 10/2016 for STEMI.  Marland Kitchen STEMI involving right coronary artery ([place]) 11/01/2016  . Tobacco abuse   . Torsades de pointes ([place])    a. during adm 10/2016 for STEMI, felt infarct  related.  . Vulvar cancer [facility])     Past Surgical History:  Procedure Laterality Date  . CARDIAC CATHETERIZATION N/A 11/01/2016   Procedure: Left Heart Cath and Coronary Angiography;  Surgeon: Sherren Mocha, MD;  Location: [place] [facility];  Service: Cardiovascular;  Laterality: N/A;  . CARDIAC CATHETERIZATION N/A 11/01/2016   Procedure: Coronary Stent Intervention;  Surgeon: Sherren Mocha, MD;  Location: [place] [facility];  Service: Cardiovascular;  Laterality: N/A;  DES to Prox RCA- Promus 3.5x28  . CARDIAC CATHETERIZATION N/A 11/01/2016   Procedure: Left Heart Cath and Coronary Angiography;  Surgeon: Sherren Mocha, MD;  Location: [place] [facility];  Service: Cardiovascular;  Laterality: N/A;  . CARDIAC CATHETERIZATION N/A 11/01/2016   Procedure: Intravascular Ultrasound/IVUS;  Surgeon: Sherren Mocha, MD;  Location: [place] [facility];  Service: Cardiovascular;  Laterality: N/A;  . CARDIAC CATHETERIZATION N/A 11/01/2016   Procedure: Temporary Pacemaker;  Surgeon: Sherren Mocha, MD;  Location: [place] [facility];  Service: Cardiovascular;  Laterality: N/A;  . CARDIAC CATHETERIZATION N/A 11/01/2016   Procedure: Coronary Thrombectomy;  Surgeon: Sherren Mocha, MD;  Location: [place] [facility];  Service: Cardiovascular;  Laterality: N/A;  . CESAREAN SECTION    . LAMINECTOMY  1986  . TONSILLECTOMY  1981  . WISDOM TOOTH EXTRACTION       reports that she has quit smoking. Her smoking use included Cigarettes. She smoked 0.50 packs per day. She has never used smokeless tobacco. She reports that she does not drink alcohol or use drugs.   family history includes Heart disease in her mother; Uterine cancer in her mother.   Outpatient Medications Prior to Visit  Medication Sig Dispense Refill  . acetaminophen (TYLENOL) 500 MG tablet Take 500 mg by mouth every 6 (six) hours as needed for mild pain.    Marland Kitchen aspirin EC 81 MG EC tablet Take 1 tablet (81 mg total) by mouth daily. 90 tablet 3    . atorvastatin (LIPITOR) 80 MG tablet Take 1 tablet (80 mg total) by mouth every evening. 30 tablet 6  . calcium carbonate (TUMS - DOSED IN MG ELEMENTAL CALCIUM) 500 MG chewable tablet Chew 1 tablet by mouth daily as needed for indigestion or heartburn.    . docusate sodium (COLACE) 100 MG capsule Take 100 mg by mouth every other day.     . lisinopril (PRINIVIL,ZESTRIL) 5 MG tablet Take 1 tablet (5 mg total) by mouth daily. 30 tablet 1  . metoprolol succinate (TOPROL-XL) 25 MG 24 hr tablet Take 1 tablet (25 mg total) by mouth daily. 30 tablet 6  . nitroGLYCERIN (NITROSTAT) 0.4 MG SL tablet Place 1 tablet (0.4 mg total) under the tongue every 5 (five) minutes as needed for chest pain (up to 3 doses). 25 tablet 3  . ticagrelor (BRILINTA) 90 MG TABS tablet Take 1 tablet (90 mg total) by mouth 2 (two) times daily. 180 tablet 3   No facility-administered medications prior to visit.      Allergies: Patient has no known allergies.    PHYSICAL EXAM: VS:  BP 132/72 (BP Location: Left Arm, Patient Position: Sitting, Cuff Size: Normal)  Pulse 63   Ht 5' 5"  (1.651 m)   Wt 225 lb 4 oz (102.2 kg)   BMI 37.48 kg/m  , Body mass index is 37.48 kg/m. Wt Readings from Last 3 Encounters:  12/11/16 225 lb 4 oz (102.2 kg)  11/12/16 224 lb 6.4 oz (101.8 kg)  11/05/16 221 lb 1.9 oz (100.3 kg)    GENERAL:  well developed, well nourished, obese, not in acute distress HEENT: normocephalic, pink conjunctivae, anicteric sclerae, no xanthelasma, normal dentition, oropharynx clear NECK:  no neck vein engorgement, JVP normal, no hepatojugular reflux, carotid upstroke brisk and symmetric, no bruit, no thyromegaly, no lymphadenopathy LUNGS:  good respiratory effort, clear to auscultation bilaterally CV:  PMI not displaced, no thrills, no lifts, S1 and S2 within normal limits, no palpable S3 or S4, no murmurs, no rubs, no gallops ABD:  Soft, nontender, nondistended, normoactive bowel sounds, no abdominal aortic  bruit, no hepatomegaly, no splenomegaly MS: nontender back, no kyphosis, no scoliosis, no joint deformities EXT:  2+ DP/PT pulses, no edema, no varicosities, no cyanosis, no clubbing SKIN: warm, nondiaphoretic, normal turgor, no ulcers NEUROPSYCH: alert, oriented to person, place, and time, sensory/motor grossly intact, normal mood, appropriate affect  Recent Labs: 11/01/2016: B Natriuretic Peptide 92.0; TSH 2.608 11/03/2016: Magnesium 1.9 11/05/2016: Hemoglobin 12.4; Platelets 172 12/05/2016: ALT 26; BUN 20; Creatinine, Ser 0.77; Potassium 4.7; Sodium 138   Lipid Panel    Component Value Date/Time   CHOL 125 11/21/2016 0854   TRIG 57 11/21/2016 0854   HDL 51 11/21/2016 0854   CHOLHDL 2.5 11/21/2016 0854   CHOLHDL 4.2 11/01/2016 0750   VLDL 20 11/01/2016 0750   LDLCALC 63 11/21/2016 0854     Other studies Reviewed:  EKG: EKG from 12/12/2016 was personally reviewed by me and showed sinus rhythm, inverted inferior T waves. EKG previous to that showed inferior ST elevation.   Additional studies/ records that were reviewed personally reviewed by me today include:  Echo 12/05/2016 Left ventricle: The cavity size was normal. Wall thickness was   normal. Systolic function was normal. The estimated ejection   fraction was in the range of 50% to 55%. Mild hypokinesis of the   inferior myocardium. Left ventricular diastolic function   parameters were normal.  Echo 11/02/2016: Left ventricle: The cavity size was normal. Systolic function was   moderately to severely reduced. The estimated ejection fraction   was in the range of 30% to 35%. Wall motion was normal; there   were no regional wall motion abnormalities. Doppler parameters   are consistent with abnormal left ventricular relaxation (grade 1   diastolic dysfunction). Doppler parameters are consistent with   elevated ventricular end-diastolic filling pressure. - Ventricular septum: Septal motion showed paradox. - Aortic  valve: Trileaflet; normal thickness leaflets. There was no   regurgitation. - Aortic root: The aortic root was normal in size. - Mitral valve: Structurally normal valve. There was mild   regurgitation. - Left atrium: The atrium was normal in size. - Right ventricle: The cavity size was moderately dilated. Wall   thickness was normal. Systolic function was normal. - Tricuspid valve: There was mild regurgitation. - Pulmonary arteries: Systolic pressure was within the normal   range. - Inferior vena cava: The vessel was normal in size. - Pericardium, extracardiac: There was no pericardial effusion. - Impressions: There is akinesis of the basal and mid inferoseptal,   inferior, inferolateral walls, and apical inferior and septal   walls. Overall LVEF is moderately decreased estimated at  30-35%.   There is moderate RV dysfunction.  Impressions:  - There is akinesis of the basal and mid inferoseptal, inferior,   inferolateral walls, and apical inferior and septal walls.   Overall LVEF is moderately decreased estimated at 30-35%. There   is moderate RV dysfunction.  Cath 11/01/2016: Acute inferolateral STEMI secondary to thrombotic occlusion of the right coronary artery, treated successfully with primary PCI using a drug-eluting stent 2. Moderate nonobstructive LAD stenosis 3. Mild nonobstructive left circumflex stenosis  Recommendations:  Dual antiplatelet therapy with aspirin and brilinta for 12 months  Initiate post MI medical therapy  Patient has good prognostic signs with minimal LV dysfunction and good ST segment resolution post PCI. If she is clinically stable it would be reasonable to consider hospital discharge tomorrow afternoon  Tobacco cessation counseling  Acute stent thrombosis - treated successfully with aspiration thrombectomy, angiojet thrombectomy, and PTCA, guided by intravascular ultrasound  ASSESSMENT AND PLAN: CAD, s/P Inferolateral STEMI s/p DES to RCA  11/01/2016, physically developed acute stent thrombosis, treated with aspiration and thrombectomy History of nonsustained ventricular tachycardia Ischemic cardiomyopathy, EF improved EF from last echo 12/05/2016, shows improvement in ejection fraction now 50-55% No angina. For palpitations, recommend 24-hour Holter monitor. Continue medical therapy aspirin and Brilinta for at least 1 year. Continue lisinopril and metoprolol. Lifestyle changes recommended. Patient could not do cardiac rehabilitation due to her work schedule.  Hyperlipidemia LDL goal is less than 70. Repeat CMP and lipid panel in April. Continue statin therapy for now. Dietary changes recommended.  Obesity Body mass index is 37.48 kg/m.Marland Kitchen Recommend aggressive weight loss through diet and increased physical activity.   History smoked eating Patient stopped smoking since her MI.  Diabetes Patient has a follow-up coming up with her PCP regarding this. Recommend strict diabetes control as this is a strong risk factor for progression of coronary artery disease.   Current medicines are reviewed at length with the patient today.  The patient does not have concerns regarding medicines.  Labs/ tests ordered today include:  Orders Placed This Encounter  Procedures  . Comp Met (CMET)  . Lipid panel  . Holter monitor - 24 hour    I had a lengthy and detailed discussion with the patient regarding diagnoses, prognosis, diagnostic options, treatment options , and side effects of medications.   I counseled the patient on importance of lifestyle modification including heart healthy diet, regular physical activity .   Disposition:   FU with undersigned in 3 months  I spent at least 40 minutes with the patient today and more than 50% of the time was spent counseling the patient and coordinating care.    Signed, Wende Bushy, MD  12/11/2016 2:08 PM    [place]  This note was generated in part with  voice recognition software and I apologize for any typographical errors that were not detected and corrected.

## 2016-12-11 NOTE — Patient Instructions (Signed)
Labwork: Your physician recommends that you return for lab work in: April for fasting lab work. Make sure not to eat or drink after midnight prior to coming in for these labs to be done.    Testing/Procedures: Your physician has recommended that you wear a holter monitor. Holter monitors are medical devices that record the heart's electrical activity. Doctors most often use these monitors to diagnose arrhythmias. Arrhythmias are problems with the speed or rhythm of the heartbeat. The monitor is a small, portable device. You can wear one while you do your normal daily activities. This is usually used to diagnose what is causing palpitations/syncope (passing out).    Follow-Up: Your physician recommends that you schedule a follow-up appointment in: April after labs have been done.   It was a pleasure seeing you today here in the office. Please do not hesitate to give [place] a call back if you have any further questions. [place], BSN

## 2016-12-17 ENCOUNTER — Encounter: Payer: Self-pay | Admitting: Family Medicine

## 2016-12-17 ENCOUNTER — Ambulatory Visit (INDEPENDENT_AMBULATORY_CARE_PROVIDER_SITE_OTHER): Payer: BLUE CROSS/BLUE SHIELD | Admitting: Family Medicine

## 2016-12-17 VITALS — BP 114/62 | HR 66 | Temp 98.1°F | Wt 223.0 lb

## 2016-12-17 DIAGNOSIS — I2510 Atherosclerotic heart disease of native coronary artery without angina pectoris: Secondary | ICD-10-CM

## 2016-12-17 DIAGNOSIS — E119 Type 2 diabetes mellitus without complications: Secondary | ICD-10-CM

## 2016-12-17 MED ORDER — METFORMIN HCL 500 MG PO TABS: 500.0000 mg | ORAL_TABLET | Freq: Two times a day (BID) | ORAL | 3 refills | Status: DC

## 2016-12-17 NOTE — Patient Instructions (Signed)
Great to see you.   We are starting Metformin 500 mg twice daily.  Please come see me in 3 months.  Congratulations on quitting smoking!

## 2016-12-17 NOTE — Progress Notes (Signed)
Subjective:   Patient ID: Karen Hartman, female    DOB: 10/15/1956, 61 y.o.   MRN: [id number]  Karen Hartman is a pleasant 61 y.o. year old female who presents to clinic today with Follow-up (DM)  on 12/17/2016  HPI:  Has been through a lot since I last saw her.  Had STEMI on 10/2016 s/p PCI to RCA.  She is on ASA and Brilinta.  Followed by cardiology, Dr. Yvone Neu.  Last saw he on 12/11/16. Note reviewed.  She did quit smoking 6 weeks ago.   DM- deteriorated.  Had been diet controlled previously.   Lab Results  Component Value Date   HGBA1C 7.8 (H) 11/01/2016   Lab Results  Component Value Date   CHOL 125 11/21/2016   HDL 51 11/21/2016   LDLCALC 63 11/21/2016   TRIG 57 11/21/2016   CHOLHDL 2.5 11/21/2016    Current Outpatient Prescriptions on File Prior to Visit  Medication Sig Dispense Refill  . acetaminophen (TYLENOL) 500 MG tablet Take 500 mg by mouth every 6 (six) hours as needed for mild pain.    Marland Kitchen aspirin EC 81 MG EC tablet Take 1 tablet (81 mg total) by mouth daily. 90 tablet 3  . atorvastatin (LIPITOR) 80 MG tablet Take 1 tablet (80 mg total) by mouth every evening. 30 tablet 6  . calcium carbonate (TUMS - DOSED IN MG ELEMENTAL CALCIUM) 500 MG chewable tablet Chew 1 tablet by mouth daily as needed for indigestion or heartburn.    . docusate sodium (COLACE) 100 MG capsule Take 100 mg by mouth every other day.     . lisinopril (PRINIVIL,ZESTRIL) 5 MG tablet Take 1 tablet (5 mg total) by mouth daily. 30 tablet 1  . metoprolol succinate (TOPROL-XL) 25 MG 24 hr tablet Take 1 tablet (25 mg total) by mouth daily. 30 tablet 6  . nitroGLYCERIN (NITROSTAT) 0.4 MG SL tablet Place 1 tablet (0.4 mg total) under the tongue every 5 (five) minutes as needed for chest pain (up to 3 doses). 25 tablet 3  . ticagrelor (BRILINTA) 90 MG TABS tablet Take 1 tablet (90 mg total) by mouth 2 (two) times daily. 180 tablet 3   No current facility-administered medications on file prior to  visit.     No Known Allergies  Past Medical History:  Diagnosis Date  . CAD in native artery    a. inf/lat STEMI 10/2016 with occluded RCA s/p DES, moderate nonobstructive LAD stenosis and mild nonobstructive LCx stenosis, EF 50-55% -> taken back to cath lab for acute stent thrombosis s/p thromectomy/PTCA of RCA same day. EF 30-35% by echo 11/02/16.   . Diabetes ([place])   . Hyperlipidemia, mixed 11/01/2016  . Ischemic cardiomyopathy   . Morbid obesity ([place])   . NSVT (nonsustained ventricular tachycardia) ([place])    a. during adm 10/2016 for STEMI.  Marland Kitchen STEMI involving right coronary artery ([place]) 11/01/2016  . Tobacco abuse   . Torsades de pointes ([place])    a. during adm 10/2016 for STEMI, felt infarct related.  . Vulvar cancer [facility])     Past Surgical History:  Procedure Laterality Date  . CARDIAC CATHETERIZATION N/A 11/01/2016   Procedure: Left Heart Cath and Coronary Angiography;  Surgeon: Sherren Mocha, MD;  Location: [place] [facility];  Service: Cardiovascular;  Laterality: N/A;  . CARDIAC CATHETERIZATION N/A 11/01/2016   Procedure: Coronary Stent Intervention;  Surgeon: Sherren Mocha, MD;  Location: [place] [facility] LAB;  Service: Cardiovascular;  Laterality: N/A;  DES to Prox RCA- Promus 3.5x28  . CARDIAC CATHETERIZATION N/A 11/01/2016   Procedure: Left Heart Cath and Coronary Angiography;  Surgeon: Sherren Mocha, MD;  Location: [place] [facility] LAB;  Service: Cardiovascular;  Laterality: N/A;  . CARDIAC CATHETERIZATION N/A 11/01/2016   Procedure: Intravascular Ultrasound/IVUS;  Surgeon: Sherren Mocha, MD;  Location: [place] [facility];  Service: Cardiovascular;  Laterality: N/A;  . CARDIAC CATHETERIZATION N/A 11/01/2016   Procedure: Temporary Pacemaker;  Surgeon: Sherren Mocha, MD;  Location: [place] [facility] LAB;  Service: Cardiovascular;  Laterality: N/A;  . CARDIAC CATHETERIZATION N/A 11/01/2016   Procedure: Coronary Thrombectomy;  Surgeon: Sherren Mocha, MD;  Location: [place] [facility] LAB;  Service: Cardiovascular;  Laterality: N/A;  . CESAREAN SECTION    . LAMINECTOMY  1986  . TONSILLECTOMY  1981  . WISDOM TOOTH EXTRACTION      Family History  Problem Relation Age of Onset  . Uterine cancer Mother   . Heart disease Mother     Social History   Social History  . Marital status: Married    Spouse name: N/A  . Number of children: N/A  . Years of education: N/A   Occupational History  . Not on file.   Social History Main Topics  . Smoking status: Former Smoker    Packs/day: 0.50    Types: Cigarettes  . Smokeless tobacco: Never Used  . Alcohol use No  . Drug use: No  . Sexual activity: Yes   Other Topics Concern  . Not on file   Social History Narrative   RN at [employer]   Married   The PMH, [place], Social History, Family History, Medications, and allergies have been reviewed in [facility], and have been updated if relevant.   Review of Systems  Constitutional: Negative.   Respiratory: Negative.   Cardiovascular: Negative.   Genitourinary: Negative.   Musculoskeletal: Negative.   Neurological: Negative.   Hematological: Negative.   Psychiatric/Behavioral: Negative.   All other systems reviewed and are negative.      Objective:    BP 114/62   Pulse 66   Temp 98.1 F (36.7 C) (Oral)   Wt 223 lb (101.2 kg)   SpO2 98%   BMI 37.11 kg/m    Physical Exam  Constitutional: She is oriented to person, place, and time. She appears well-developed and well-nourished.  HENT:  Head: Normocephalic and atraumatic.  Eyes: Conjunctivae are normal.  Cardiovascular: Normal rate and regular rhythm.   Pulmonary/Chest: Effort normal and breath sounds normal.  Neurological: She is alert and oriented to person, place, and time.  Skin: Skin is warm and dry. She is not diaphoretic.  Psychiatric: She has a normal mood and affect. Her behavior is normal. Judgment and thought content normal.  Nursing note and vitals reviewed.         Assessment &  Plan:   Type 2 diabetes mellitus without complication, without long-term current use of insulin (HCC)  CAD in native artery No Follow-up on file.

## 2016-12-17 NOTE — Assessment & Plan Note (Signed)
Deteriorated. >25 minutes spent in face to face time with patient, >50% spent in counselling or coordination of care She did quit smoking! Start metformin 500 mg twice daily. She is on statin. Follow up in 3 months.

## 2016-12-17 NOTE — Progress Notes (Signed)
Pre visit review using our clinic review tool, if applicable. No additional management support is needed unless otherwise documented below in the visit note. 

## 2016-12-22 ENCOUNTER — Ambulatory Visit: Payer: BLUE CROSS/BLUE SHIELD

## 2016-12-22 DIAGNOSIS — I255 Ischemic cardiomyopathy: Secondary | ICD-10-CM

## 2016-12-22 DIAGNOSIS — I472 Ventricular tachycardia: Secondary | ICD-10-CM

## 2016-12-22 DIAGNOSIS — I4729 Other ventricular tachycardia: Secondary | ICD-10-CM

## 2016-12-29 ENCOUNTER — Ambulatory Visit (INDEPENDENT_AMBULATORY_CARE_PROVIDER_SITE_OTHER): Payer: BLUE CROSS/BLUE SHIELD

## 2016-12-29 DIAGNOSIS — I255 Ischemic cardiomyopathy: Secondary | ICD-10-CM | POA: Diagnosis not present

## 2016-12-29 DIAGNOSIS — I472 Ventricular tachycardia: Secondary | ICD-10-CM

## 2017-01-01 ENCOUNTER — Ambulatory Visit
Admission: RE | Admit: 2017-01-01 | Discharge: 2017-01-01 | Disposition: A | Discharge status: Home / Self Care | Payer: BLUE CROSS/BLUE SHIELD | Source: Ambulatory Visit | Attending: Cardiology | Admitting: Cardiology

## 2017-01-01 DIAGNOSIS — I472 Ventricular tachycardia: Secondary | ICD-10-CM | POA: Diagnosis not present

## 2017-01-02 ENCOUNTER — Encounter (HOSPITAL_COMMUNITY): Payer: Self-pay | Admitting: Cardiovascular Disease

## 2017-01-05 ENCOUNTER — Other Ambulatory Visit: Payer: Self-pay | Admitting: Physician Assistant

## 2017-01-05 NOTE — Telephone Encounter (Signed)
Ok to refill, Lisinopril 5 mg tablet?

## 2017-01-29 DIAGNOSIS — L309 Dermatitis, unspecified: Secondary | ICD-10-CM | POA: Diagnosis not present

## 2017-01-29 DIAGNOSIS — Z85828 Personal history of other malignant neoplasm of skin: Secondary | ICD-10-CM | POA: Diagnosis not present

## 2017-01-29 DIAGNOSIS — D398 Neoplasm of uncertain behavior of other specified female genital organs: Secondary | ICD-10-CM | POA: Diagnosis not present

## 2017-02-03 ENCOUNTER — Other Ambulatory Visit: Payer: Self-pay | Admitting: Cardiology

## 2017-02-03 MED ORDER — LISINOPRIL 5 MG PO TABS: 5.0000 mg | ORAL_TABLET | Freq: Every day | ORAL | 1 refills | Status: DC

## 2017-03-10 ENCOUNTER — Encounter: Payer: Self-pay | Admitting: Cardiology

## 2017-03-10 ENCOUNTER — Ambulatory Visit (INDEPENDENT_AMBULATORY_CARE_PROVIDER_SITE_OTHER): Payer: BLUE CROSS/BLUE SHIELD | Admitting: Cardiology

## 2017-03-10 VITALS — BP 120/62 | HR 64 | Ht 65.0 in | Wt 224.8 lb

## 2017-03-10 DIAGNOSIS — Z6837 Body mass index (BMI) 37.0-37.9, adult: Secondary | ICD-10-CM | POA: Diagnosis not present

## 2017-03-10 DIAGNOSIS — E6609 Other obesity due to excess calories: Secondary | ICD-10-CM

## 2017-03-10 DIAGNOSIS — E782 Mixed hyperlipidemia: Secondary | ICD-10-CM | POA: Diagnosis not present

## 2017-03-10 DIAGNOSIS — I2510 Atherosclerotic heart disease of native coronary artery without angina pectoris: Secondary | ICD-10-CM | POA: Diagnosis not present

## 2017-03-10 DIAGNOSIS — Z9861 Coronary angioplasty status: Secondary | ICD-10-CM | POA: Diagnosis not present

## 2017-03-10 DIAGNOSIS — IMO0001 Reserved for inherently not codable concepts without codable children: Secondary | ICD-10-CM

## 2017-03-10 NOTE — Patient Instructions (Signed)
Labwork: Labs to be done at Primary care physician in case they need additional labs. CMET, Lipid panel.   Follow-Up: Your physician wants you to follow-up in: 6 months. You will receive a reminder letter in the mail two months in advance. If you don't receive a letter, please call our office to schedule the follow-up appointment.   It was a pleasure seeing you today here in the office. Please do not hesitate to give [place] a call back if you have any further questions. [place], BSN

## 2017-03-10 NOTE — Progress Notes (Signed)
Cardiology Office Note   Date:  03/10/2017   ID:  Karen Hartman, Karen Hartman 08/31/1956, MRN [id number]  Referring Doctor:  Arnette Norris, MD   Cardiologist:   Wende Bushy, MD   Reason for consultation:  Chief Complaint  Patient presents with  . other    3 month follow up from cardiac event monitor. Patient denies chest pain and SOB.  Meds reviewed verbally wiht patient.       History of Present Illness: Karen Hartman is a 61 y.o. female who presents for ffup for CAD  Review of records show: Karen Hartman is a 62 y.o. female with history of morbid obesity, DM (previously treated with weight loss), tobacco abuse, vulvar cancer s/p prior surgery, recently diagnosed CAD with inf-lat STEMI 10/2016 s/p PCI to RCA who presents for f/u. During recent admission for STEMI she was found to have thrombotic occlusion of the right coronary artery, treated successfully with primary PCI using a drug-eluting stent. She had moderate nonobstructive LAD stenosis and mild nonobstructive LCx stenosis, EF 50-55%. She was initiated on DAPT with ASA/Brilinta. She initially had good ST segment resolution after the procedure. However, later that morning she developed recurrent severe chest pain, diaphoresis, and ST elevation concerning for acute stent thrombosis. She was treated with Aggrestat and taken back to the cath lab urgently which showed acute stent thrombosis which was treated successfully with aspiration thrombectomy, angiojet thrombectomy, and PTCA, guided by intravascular ultrasound. She was reloaded with an additional 180mg  of Brilinta, continued on Aggrestat for 18 hours. It was felt this event possibly happened due to her delay in digesting Brilinta. Her troponin peaked at 29. 2D echo 11/02/16 showed EF 30-35%, grade 1 DD, mild MR, moderately dilated RV, moderate RV dysfunction, akinesis of the basal and mid inferoseptal,  inferior, inferolateral walls, and apical inferior and septal walls. Her soft  blood pressure prohibited aggressive medical therapy of LV dysfunction. On 11/03/16 she was noted to have runs of NSVT as well as Torsades (18 seconds) on telemetry. She was temporarily treated with heparin, amiodarone and lidocaine. The arrhythmia was felt to be related to recent infarct and improved over the next several hours. However, given that this arrhythmia occurred 36 hours after initial event, LifeVest was recommended with repeat echo 30 days - if EF >35 and no shocks, could discontinue Lifevest at that time. Labs otherwise notable for A1C 7.8, LDL 160, CMET with mildly elevated calcium and insufficient sample for LFTs, CBC wnl.  Since last visit, patient has been doing well. No chest pains or shortness of breath. No lightheadedness or syncope. She is due to see her PCP next week. She feels that her glucose control has improved.  ROS:  Please see the history of present illness. Aside from mentioned under HPI, all other systems are reviewed and negative.    Past Medical History:  Diagnosis Date  . CAD in native artery    a. inf/lat STEMI 10/2016 with occluded RCA s/p DES, moderate nonobstructive LAD stenosis and mild nonobstructive LCx stenosis, EF 50-55% -> taken back to cath lab for acute stent thrombosis s/p thromectomy/PTCA of RCA same day. EF 30-35% by echo 11/02/16.   . Diabetes ([place])   . Hyperlipidemia, mixed 11/01/2016  . Ischemic cardiomyopathy   . Morbid obesity ([place])   . NSVT (nonsustained ventricular tachycardia) ([place])    a. during adm 10/2016 for STEMI.  Marland Kitchen STEMI involving right coronary artery ([place]) 11/01/2016  . Tobacco abuse   .  Torsades de pointes ([place])    a. during adm 10/2016 for STEMI, felt infarct related.  . Vulvar cancer [facility])     Past Surgical History:  Procedure Laterality Date  . CARDIAC CATHETERIZATION N/A 11/01/2016   Procedure: Left Heart Cath and Coronary Angiography;  Surgeon: Sherren Mocha, MD;  Location: [place] [facility];  Service:  Cardiovascular;  Laterality: N/A;  . CARDIAC CATHETERIZATION N/A 11/01/2016   Procedure: Coronary Stent Intervention;  Surgeon: Sherren Mocha, MD;  Location: [place] [facility];  Service: Cardiovascular;  Laterality: N/A;  DES to Prox RCA- Promus 3.5x28  . CESAREAN SECTION    . CORONARY THROMBECTOMY N/A 11/01/2016   Procedure: Coronary Thrombectomy;  Surgeon: Sherren Mocha, MD;  Location: [place] [facility];  Service: Cardiovascular;  Laterality: N/A;  . INTRAVASCULAR ULTRASOUND/IVUS N/A 11/01/2016   Procedure: Intravascular Ultrasound/IVUS;  Surgeon: Sherren Mocha, MD;  Location: [place] [facility];  Service: Cardiovascular;  Laterality: N/A;  . LAMINECTOMY  1986  . LEFT HEART CATH AND CORONARY ANGIOGRAPHY N/A 11/01/2016   Procedure: Left Heart Cath and Coronary Angiography;  Surgeon: Sherren Mocha, MD;  Location: [place] [facility];  Service: Cardiovascular;  Laterality: N/A;  . TEMPORARY PACEMAKER N/A 11/01/2016   Procedure: Temporary Pacemaker;  Surgeon: Sherren Mocha, MD;  Location: [place] [facility];  Service: Cardiovascular;  Laterality: N/A;  . TONSILLECTOMY  1981  . WISDOM TOOTH EXTRACTION       reports that she has quit smoking. Her smoking use included Cigarettes. She smoked 0.50 packs per day. She has never used smokeless tobacco. She reports that she does not drink alcohol or use drugs.   family history includes Heart disease in her mother; Uterine cancer in her mother.   Outpatient Medications Prior to Visit  Medication Sig Dispense Refill  . acetaminophen (TYLENOL) 500 MG tablet Take 500 mg by mouth every 6 (six) hours as needed for mild pain.    Marland Kitchen aspirin EC 81 MG EC tablet Take 1 tablet (81 mg total) by mouth daily. 90 tablet 3  . atorvastatin (LIPITOR) 80 MG tablet Take 1 tablet (80 mg total) by mouth every evening. 30 tablet 6  . calcium carbonate (TUMS - DOSED IN MG ELEMENTAL CALCIUM) 500 MG chewable tablet Chew 1 tablet by mouth daily as needed for indigestion or  heartburn.    . docusate sodium (COLACE) 100 MG capsule Take 100 mg by mouth every other day.     . lisinopril (PRINIVIL,ZESTRIL) 5 MG tablet Take 1 tablet (5 mg total) by mouth daily. 90 tablet 1  . metFORMIN (GLUCOPHAGE) 500 MG tablet Take 1 tablet (500 mg total) by mouth 2 (two) times daily with a meal. 180 tablet 3  . metoprolol succinate (TOPROL-XL) 25 MG 24 hr tablet Take 1 tablet (25 mg total) by mouth daily. 30 tablet 6  . nitroGLYCERIN (NITROSTAT) 0.4 MG SL tablet Place 1 tablet (0.4 mg total) under the tongue every 5 (five) minutes as needed for chest pain (up to 3 doses). 25 tablet 3  . ticagrelor (BRILINTA) 90 MG TABS tablet Take 1 tablet (90 mg total) by mouth 2 (two) times daily. 180 tablet 3   No facility-administered medications prior to visit.      Allergies: Patient has no known allergies.    PHYSICAL EXAM: VS:  BP 120/62 (BP Location: Left Arm, Patient Position: Sitting, Cuff Size: Normal)   Pulse 64   Ht 5\' 5"  (1.651 m)   Wt 224 lb 12 oz (101.9  kg)   BMI 37.40 kg/m  , Body mass index is 37.4 kg/m. Wt Readings from Last 3 Encounters:  03/10/17 224 lb 12 oz (101.9 kg)  12/17/16 223 lb (101.2 kg)  12/11/16 225 lb 4 oz (102.2 kg)  GENERAL:  well developed, well nourished, obese, not in acute distress HEENT: normocephalic, pink conjunctivae, anicteric sclerae, no xanthelasma, normal dentition, oropharynx clear NECK:  no neck vein engorgement, JVP normal, no hepatojugular reflux, carotid upstroke brisk and symmetric, no bruit, no thyromegaly, no lymphadenopathy LUNGS:  good respiratory effort, clear to auscultation bilaterally CV:  PMI not displaced, no thrills, no lifts, S1 and S2 within normal limits, no palpable S3 or S4, no murmurs, no rubs, no gallops ABD:  Soft, nontender, nondistended, normoactive bowel sounds, no abdominal aortic bruit, no hepatomegaly, no splenomegaly MS: nontender back, no kyphosis, no scoliosis, no joint deformities EXT:  2+ DP/PT pulses, no  edema, no varicosities, no cyanosis, no clubbing SKIN: warm, nondiaphoretic, normal turgor, no ulcers NEUROPSYCH: alert, oriented to person, place, and time, sensory/motor grossly intact, normal mood, appropriate affect    Recent Labs: 11/01/2016: B Natriuretic Peptide 92.0; TSH 2.608 11/03/2016: Magnesium 1.9 11/05/2016: Hemoglobin 12.4; Platelets 172 12/05/2016: ALT 26; BUN 20; Creatinine, Ser 0.77; Potassium 4.7; Sodium 138   Lipid Panel    Component Value Date/Time   CHOL 125 11/21/2016 0854   TRIG 57 11/21/2016 0854   HDL 51 11/21/2016 0854   CHOLHDL 2.5 11/21/2016 0854   CHOLHDL 4.2 11/01/2016 0750   VLDL 20 11/01/2016 0750   LDLCALC 63 11/21/2016 0854     Other studies Reviewed:  EKG: EKG from 12-06-16 was personally reviewed by me and showed sinus rhythm, inverted inferior T waves. EKG previous to that showed inferior ST elevation.   Additional studies/ records that were reviewed personally reviewed by me today include:  Echo 12/05/2016 Left ventricle: The cavity size was normal. Wall thickness was   normal. Systolic function was normal. The estimated ejection   fraction was in the range of 50% to 55%. Mild hypokinesis of the   inferior myocardium. Left ventricular diastolic function   parameters were normal.  Echo 11/02/2016: Left ventricle: The cavity size was normal. Systolic function was   moderately to severely reduced. The estimated ejection fraction   was in the range of 30% to 35%. Wall motion was normal; there   were no regional wall motion abnormalities. Doppler parameters   are consistent with abnormal left ventricular relaxation (grade 1   diastolic dysfunction). Doppler parameters are consistent with   elevated ventricular end-diastolic filling pressure. - Ventricular septum: Septal motion showed paradox. - Aortic valve: Trileaflet; normal thickness leaflets. There was no   regurgitation. - Aortic root: The aortic root was normal in size. -  Mitral valve: Structurally normal valve. There was mild   regurgitation. - Left atrium: The atrium was normal in size. - Right ventricle: The cavity size was moderately dilated. Wall   thickness was normal. Systolic function was normal. - Tricuspid valve: There was mild regurgitation. - Pulmonary arteries: Systolic pressure was within the normal   range. - Inferior vena cava: The vessel was normal in size. - Pericardium, extracardiac: There was no pericardial effusion. - Impressions: There is akinesis of the basal and mid inferoseptal,   inferior, inferolateral walls, and apical inferior and septal   walls. Overall LVEF is moderately decreased estimated at 30-35%.   There is moderate RV dysfunction.  Impressions:  - There is akinesis of the basal and  mid inferoseptal, inferior,   inferolateral walls, and apical inferior and septal walls.   Overall LVEF is moderately decreased estimated at 30-35%. There   is moderate RV dysfunction.  Cath 11/01/2016: Acute inferolateral STEMI secondary to thrombotic occlusion of the right coronary artery, treated successfully with primary PCI using a drug-eluting stent 2. Moderate nonobstructive LAD stenosis 3. Mild nonobstructive left circumflex stenosis  Recommendations:  Dual antiplatelet therapy with aspirin and brilinta for 12 months  Initiate post MI medical therapy  Patient has good prognostic signs with minimal LV dysfunction and good ST segment resolution post PCI. If she is clinically stable it would be reasonable to consider hospital discharge tomorrow afternoon  Tobacco cessation counseling  Acute stent thrombosis - treated successfully with aspiration thrombectomy, angiojet thrombectomy, and PTCA, guided by intravascular ultrasound  Holter 01/06/2017: Overall rhythm was sinus. Heart rate ranged from 53-116 bpm, average of 70 BPM.  No high grade supraventricular ectopy: 48 isolated PACs, 2 atrial couplets. No atrial  runs.  Ventricular ectopy: 8 isolated PVCs. One 3 beat run of PVCs at 194 BPM, 12/30/2016, 4:07 AM.   No evidence of atrial fibrillation, AV block, pauses.   ASSESSMENT AND PLAN: CAD, S/P Inferolateral STEMI s/p DES to RCA 11/01/2016  acute stent thrombosis, treated with aspiration and thrombectomy History of nonsustained ventricular tachycardia Ischemic cardiomyopathy, EF improved EF from last echo 12/05/2016, shows improvement in ejection fraction now 50-55% No significant change in recommendations from last visit 12/11/2016: No chest pain or shortness of breath.  No significant arrhythmia on Holter monitor.  Continue medical therapy aspirin and Brilinta for at least 1 year. Continue lisinopril and metoprolol. Continue risk factor modification, heart healthy lifestyle change.  Hyperlipidemia Lipid levels at goal (LDL < 70). Continue current medical therapy and lifestyle changes. Patient is seen PCP next week, may hold off on labs until she sees her PCP. She is rather have one stick versus more than 1.  Obesity Body mass index is 37.4 kg/m. Recommend aggressive weight loss through diet and increased physical activity.   Current medicines are reviewed at length with the patient today.  The patient does not have concerns regarding medicines.  Labs/ tests ordered today include:  No orders of the defined types were placed in this encounter.   I counseled the patient on importance of lifestyle modification including heart healthy diet, regular physical activity .   Disposition:   FU with cardiology in 6 months  I spent at least 25 minutes with the patient today and more than 50% of the time was spent counseling the patient and coordinating care.      Signed, Wende Bushy, MD  03/10/2017 9:08 AM    [place]  This note was generated in part with voice recognition software and I apologize for any typographical errors that were not detected and  corrected.

## 2017-03-16 ENCOUNTER — Ambulatory Visit (INDEPENDENT_AMBULATORY_CARE_PROVIDER_SITE_OTHER): Payer: BLUE CROSS/BLUE SHIELD | Admitting: Family Medicine

## 2017-03-16 ENCOUNTER — Encounter: Payer: Self-pay | Admitting: Family Medicine

## 2017-03-16 VITALS — BP 108/74 | HR 67 | Temp 98.3°F | Wt 226.0 lb

## 2017-03-16 DIAGNOSIS — Z23 Encounter for immunization: Secondary | ICD-10-CM | POA: Diagnosis not present

## 2017-03-16 DIAGNOSIS — Z1159 Encounter for screening for other viral diseases: Secondary | ICD-10-CM

## 2017-03-16 DIAGNOSIS — E119 Type 2 diabetes mellitus without complications: Secondary | ICD-10-CM | POA: Diagnosis not present

## 2017-03-16 DIAGNOSIS — E782 Mixed hyperlipidemia: Secondary | ICD-10-CM | POA: Diagnosis not present

## 2017-03-16 LAB — COMPREHENSIVE METABOLIC PANEL
ALBUMIN: 4 g/dL (ref 3.5–5.2)
ALK PHOS: 235 U/L — AB (ref 39–117)
ALT: 138 U/L — ABNORMAL HIGH (ref 0–35)
AST: 95 U/L — AB (ref 0–37)
BILIRUBIN TOTAL: 0.5 mg/dL (ref 0.2–1.2)
BUN: 16 mg/dL (ref 6–23)
CO2: 30 mEq/L (ref 19–32)
CREATININE: 0.67 mg/dL (ref 0.40–1.20)
Calcium: 9.3 mg/dL (ref 8.4–10.5)
Chloride: 102 mEq/L (ref 96–112)
GFR: 95.45 mL/min (ref >60.00)
GLUCOSE: 217 mg/dL — AB (ref 70–99)
POTASSIUM: 4.2 meq/L (ref 3.5–5.1)
SODIUM: 136 meq/L (ref 135–145)
TOTAL PROTEIN: 6.9 g/dL (ref 6.0–8.3)

## 2017-03-16 LAB — LIPID PANEL
CHOLESTEROL: 122 mg/dL (ref 0–200)
HDL: 54.9 mg/dL (ref >39.00)
LDL Cholesterol: 59 mg/dL (ref 0–99)
NONHDL: 67.25
Total CHOL/HDL Ratio: 2
Triglycerides: 41 mg/dL (ref 0.0–149.0)
VLDL: 8.2 mg/dL (ref 0.0–40.0)

## 2017-03-16 LAB — HEMOGLOBIN A1C: HEMOGLOBIN A1C: 7.9 % — AB (ref 4.6–6.5)

## 2017-03-16 NOTE — Patient Instructions (Signed)
Great to see you!  Happy [birth year]!  I will call you with your results from today.

## 2017-03-16 NOTE — Progress Notes (Signed)
Pre visit review using our clinic review tool, if applicable. No additional management support is needed unless otherwise documented below in the visit note. 

## 2017-03-16 NOTE — Addendum Note (Signed)
Addended by: Pilar Grammes on: 03/16/2017 08:48 AM   Modules accepted: Orders

## 2017-03-16 NOTE — Assessment & Plan Note (Signed)
Continue current dose of Metformin. a1c, CMET and lipid panel today. Pneumovax given to pt today as well.

## 2017-03-16 NOTE — Progress Notes (Signed)
Subjective:   Patient ID: Karen Hartman, female    DOB: [birth year], 61 y.o.   MRN: [id number]  Karen Hartman is a pleasant 61 y.o. year old female with h/o DM, CAD s/p STEM and PCI in 10/2016, who presents to clinic today with Diabetes (3 minth follow-up. Does not check blood sugar at home regularly.)  on 03/16/2017  HPI:  DM- had deteriorated when I last saw her in 12/17/16.  We restarted Metformin 500 mg twice daily daily at that time. She is tolerating this well- denies side effects. Lab Results  Component Value Date   HGBA1C 7.8 (H) 11/01/2016     Lab Results  Component Value Date   CHOL 125 11/21/2016   HDL 51 11/21/2016   LDLCALC 63 11/21/2016   TRIG 57 11/21/2016   CHOLHDL 2.5 11/21/2016     She is on ASA and Brilinta.  Followed by cardiology, Dr. Yvone Neu.  Last saw he on 12/11/16. Note reviewed.  Current Outpatient Prescriptions on File Prior to Visit  Medication Sig Dispense Refill  . acetaminophen (TYLENOL) 500 MG tablet Take 500 mg by mouth every 6 (six) hours as needed for mild pain.    Marland Kitchen aspirin EC 81 MG EC tablet Take 1 tablet (81 mg total) by mouth daily. 90 tablet 3  . atorvastatin (LIPITOR) 80 MG tablet Take 1 tablet (80 mg total) by mouth every evening. 30 tablet 6  . calcium carbonate (TUMS - DOSED IN MG ELEMENTAL CALCIUM) 500 MG chewable tablet Chew 1 tablet by mouth daily as needed for indigestion or heartburn.    . docusate sodium (COLACE) 100 MG capsule Take 100 mg by mouth every other day.     . lisinopril (PRINIVIL,ZESTRIL) 5 MG tablet Take 1 tablet (5 mg total) by mouth daily. 90 tablet 1  . metFORMIN (GLUCOPHAGE) 500 MG tablet Take 1 tablet (500 mg total) by mouth 2 (two) times daily with a meal. 180 tablet 3  . metoprolol succinate (TOPROL-XL) 25 MG 24 hr tablet Take 1 tablet (25 mg total) by mouth daily. 30 tablet 6  . nitroGLYCERIN (NITROSTAT) 0.4 MG SL tablet Place 1 tablet (0.4 mg total) under the tongue every 5 (five) minutes as needed for chest  pain (up to 3 doses). 25 tablet 3  . ticagrelor (BRILINTA) 90 MG TABS tablet Take 1 tablet (90 mg total) by mouth 2 (two) times daily. 180 tablet 3   No current facility-administered medications on file prior to visit.     No Known Allergies  Past Medical History:  Diagnosis Date  . CAD in native artery    a. inf/lat STEMI 10/2016 with occluded RCA s/p DES, moderate nonobstructive LAD stenosis and mild nonobstructive LCx stenosis, EF 50-55% -> taken back to cath lab for acute stent thrombosis s/p thromectomy/PTCA of RCA same day. EF 30-35% by echo 11/02/16.   . Diabetes ([place])   . Hyperlipidemia, mixed 11/01/2016  . Ischemic cardiomyopathy   . Morbid obesity ([place])   . NSVT (nonsustained ventricular tachycardia) ([place])    a. during adm 10/2016 for STEMI.  Marland Kitchen STEMI involving right coronary artery ([place]) 11/01/2016  . Tobacco abuse   . Torsades de pointes ([place])    a. during adm 10/2016 for STEMI, felt infarct related.  . Vulvar cancer [facility])     Past Surgical History:  Procedure Laterality Date  . CARDIAC CATHETERIZATION N/A 11/01/2016   Procedure: Left Heart Cath and Coronary Angiography;  Surgeon: Sherren Mocha, MD;  [facility]  INVASIVE [facility];  Service: Cardiovascular;  Laterality: N/A;  . CARDIAC CATHETERIZATION N/A 11/01/2016   Procedure: Coronary Stent Intervention;  Surgeon: Sherren Mocha, MD;  Location: [place] [facility];  Service: Cardiovascular;  Laterality: N/A;  DES to Prox RCA- Promus 3.5x28  . CESAREAN SECTION    . CORONARY THROMBECTOMY N/A 11/01/2016   Procedure: Coronary Thrombectomy;  Surgeon: Sherren Mocha, MD;  Location: [place] [facility];  Service: Cardiovascular;  Laterality: N/A;  . INTRAVASCULAR ULTRASOUND/IVUS N/A 11/01/2016   Procedure: Intravascular Ultrasound/IVUS;  Surgeon: Sherren Mocha, MD;  Location: [place] [facility];  Service: Cardiovascular;  Laterality: N/A;  . LAMINECTOMY  1986  . LEFT HEART CATH AND CORONARY ANGIOGRAPHY N/A 11/01/2016    Procedure: Left Heart Cath and Coronary Angiography;  Surgeon: Sherren Mocha, MD;  Location: [place] [facility];  Service: Cardiovascular;  Laterality: N/A;  . TEMPORARY PACEMAKER N/A 11/01/2016   Procedure: Temporary Pacemaker;  Surgeon: Sherren Mocha, MD;  Location: [place] [facility];  Service: Cardiovascular;  Laterality: N/A;  . TONSILLECTOMY  1981  . WISDOM TOOTH EXTRACTION      Family History  Problem Relation Age of Onset  . Uterine cancer Mother   . Heart disease Mother     Social History   Social History  . Marital status: Married    Spouse name: N/A  . Number of children: N/A  . Years of education: N/A   Occupational History  . Not on file.   Social History Main Topics  . Smoking status: Former Smoker    Packs/day: 0.50    Types: Cigarettes  . Smokeless tobacco: Never Used  . Alcohol use No  . Drug use: No  . Sexual activity: Yes   Other Topics Concern  . Not on file   Social History Narrative   RN at [employer]   Married   The PMH, [place], Social History, Family History, Medications, and allergies have been reviewed in [facility], and have been updated if relevant.     Review of Systems  Constitutional: Negative.   Respiratory: Negative.   Cardiovascular: Negative.   Gastrointestinal: Negative.   Musculoskeletal: Negative.   Neurological: Negative.   Hematological: Negative.   Psychiatric/Behavioral: Negative.   All other systems reviewed and are negative.      Objective:    BP 108/74 (BP Location: Left Arm, Patient Position: Sitting, Cuff Size: Large)   Pulse 67   Temp 98.3 F (36.8 C) (Oral)   Wt 226 lb (102.5 kg)   SpO2 95%   BMI 37.61 kg/m    Physical Exam   General:  Well-developed,well-nourished,in no acute distress; alert,appropriate and cooperative throughout examination Head:  normocephalic and atraumatic.   Eyes:  vision grossly intact, PERRL Ears:  R ear normal and L ear normal externally, TMs clear bilaterally Nose:  no  external deformity.   Mouth:  good dentition.   Neck:  No deformities, masses, or tenderness noted. Lungs:  Normal respiratory effort, chest expands symmetrically. Lungs are clear to auscultation, no crackles or wheezes. Heart:  Normal rate and regular rhythm. S1 and S2 normal without gallop, murmur, click, rub or other extra sounds. Abdomen:  Bowel sounds positive,abdomen soft and non-tender without masses, organomegaly or hernias noted. Msk:  No deformity or scoliosis noted of thoracic or lumbar spine.   Extremities:  No clubbing, cyanosis, edema, or deformity noted with normal full range of motion of all joints.   Neurologic:  alert & oriented X3 and gait normal.  Skin:  Intact without suspicious lesions or rashes Psych:  Cognition and judgment appear intact. Alert and cooperative with normal attention span and concentration. No apparent delusions, illusions, hallucinations       Assessment & Plan:   Type 2 diabetes mellitus without complication, without long-term current use of insulin (HCC) - Plan: Hemoglobin A1c  Hyperlipidemia, mixed - Plan: Comprehensive metabolic panel, Lipid panel  Encounter for hepatitis C screening test for low risk patient - Plan: Hepatitis C Antibody No Follow-up on file.

## 2017-03-17 LAB — HEPATITIS C ANTIBODY: HCV Ab: NEGATIVE

## 2017-03-24 ENCOUNTER — Ambulatory Visit (INDEPENDENT_AMBULATORY_CARE_PROVIDER_SITE_OTHER): Payer: BLUE CROSS/BLUE SHIELD | Admitting: Family Medicine

## 2017-03-24 VITALS — BP 120/70 | HR 64 | Temp 98.2°F | Wt 221.0 lb

## 2017-03-24 DIAGNOSIS — E119 Type 2 diabetes mellitus without complications: Secondary | ICD-10-CM | POA: Diagnosis not present

## 2017-03-24 DIAGNOSIS — R7989 Other specified abnormal findings of blood chemistry: Secondary | ICD-10-CM | POA: Insufficient documentation

## 2017-03-24 DIAGNOSIS — R945 Abnormal results of liver function studies: Secondary | ICD-10-CM

## 2017-03-24 LAB — HEPATIC FUNCTION PANEL
ALK PHOS: 200 U/L — AB (ref 39–117)
ALT: 78 U/L — AB (ref 0–35)
AST: 49 U/L — AB (ref 0–37)
Albumin: 4.4 g/dL (ref 3.5–5.2)
BILIRUBIN DIRECT: 0.1 mg/dL (ref 0.0–0.3)
BILIRUBIN TOTAL: 0.8 mg/dL (ref 0.2–1.2)
TOTAL PROTEIN: 7.5 g/dL (ref 6.0–8.3)

## 2017-03-24 LAB — GAMMA GT: GGT: 191 U/L — ABNORMAL HIGH (ref 7–51)

## 2017-03-24 MED ORDER — METFORMIN HCL 1000 MG PO TABS: 1000.0000 mg | ORAL_TABLET | Freq: Two times a day (BID) | ORAL | 3 refills | Status: DC

## 2017-03-24 NOTE — Patient Instructions (Addendum)
Great to see you.  We are increasing your metformin to 1000 mg twice daily.  Please come see me in 3 months.  Please stop by to see Rosaria Ferries on your way out.

## 2017-03-24 NOTE — Assessment & Plan Note (Signed)
Deteriorated. Not at goal- increase Metformin to 1000 mg twice daily. Repeat a1c in 3 months. The patient indicates understanding of these issues and agrees with the plan.

## 2017-03-24 NOTE — Progress Notes (Signed)
Subjective:   Patient ID: Karen Hartman, female    DOB: 05-08-1956, 61 y.o.   MRN: [id number]  Karen Hartman is a pleasant 61 y.o. year old female who presents to clinic today with Results  on 03/24/2017  HPI:  Elevated LFTs- Increased dramatically over past 4 months.  She has been taking more tylenol lately but no in significant quantities. She is now on high dose lipitor which is new since her heart attack, along with beta blocker and brilinta- all new since her MI.  Hep C antibody neg.  She denies any abdominal pain, jaundice, black stools nausea or vomiting.  Lab Results  Component Value Date   ALT 138 (H) 03/16/2017   AST 95 (H) 03/16/2017   ALKPHOS 235 (H) 03/16/2017   BILITOT 0.5 03/16/2017    DM- a1c is also not at goal.  Does not check FSBS regularly.  She has been compliant with Metformin 500 mg twice daily.  Lab Results  Component Value Date   HGBA1C 7.9 (H) 03/16/2017    Current Outpatient Prescriptions on File Prior to Visit  Medication Sig Dispense Refill  . acetaminophen (TYLENOL) 500 MG tablet Take 500 mg by mouth every 6 (six) hours as needed for mild pain.    Marland Kitchen aspirin EC 81 MG EC tablet Take 1 tablet (81 mg total) by mouth daily. 90 tablet 3  . atorvastatin (LIPITOR) 80 MG tablet Take 1 tablet (80 mg total) by mouth every evening. 30 tablet 6  . calcium carbonate (TUMS - DOSED IN MG ELEMENTAL CALCIUM) 500 MG chewable tablet Chew 1 tablet by mouth daily as needed for indigestion or heartburn.    . docusate sodium (COLACE) 100 MG capsule Take 100 mg by mouth every other day.     . lisinopril (PRINIVIL,ZESTRIL) 5 MG tablet Take 1 tablet (5 mg total) by mouth daily. 90 tablet 1  . metoprolol succinate (TOPROL-XL) 25 MG 24 hr tablet Take 1 tablet (25 mg total) by mouth daily. 30 tablet 6  . nitroGLYCERIN (NITROSTAT) 0.4 MG SL tablet Place 1 tablet (0.4 mg total) under the tongue every 5 (five) minutes as needed for chest pain (up to 3 doses). 25 tablet 3    . ticagrelor (BRILINTA) 90 MG TABS tablet Take 1 tablet (90 mg total) by mouth 2 (two) times daily. 180 tablet 3   No current facility-administered medications on file prior to visit.     No Known Allergies  Past Medical History:  Diagnosis Date  . CAD in native artery    a. inf/lat STEMI 10/2016 with occluded RCA s/p DES, moderate nonobstructive LAD stenosis and mild nonobstructive LCx stenosis, EF 50-55% -> taken back to cath lab for acute stent thrombosis s/p thromectomy/PTCA of RCA same day. EF 30-35% by echo 11/02/16.   . Diabetes ([place])   . Hyperlipidemia, mixed 11/01/2016  . Ischemic cardiomyopathy   . Morbid obesity ([place])   . NSVT (nonsustained ventricular tachycardia) ([place])    a. during adm 10/2016 for STEMI.  Marland Kitchen STEMI involving right coronary artery ([place]) 11/01/2016  . Tobacco abuse   . Torsades de pointes ([place])    a. during adm 10/2016 for STEMI, felt infarct related.  . Vulvar cancer [facility])     Past Surgical History:  Procedure Laterality Date  . CARDIAC CATHETERIZATION N/A 11/01/2016   Procedure: Left Heart Cath and Coronary Angiography;  Surgeon: Sherren Mocha, MD;  Location: [place] [facility];  Service: Cardiovascular;  Laterality: N/A;  .  CARDIAC CATHETERIZATION N/A 11/01/2016   Procedure: Coronary Stent Intervention;  Surgeon: Sherren Mocha, MD;  Location: [place] [facility];  Service: Cardiovascular;  Laterality: N/A;  DES to Prox RCA- Promus 3.5x28  . CESAREAN SECTION    . CORONARY THROMBECTOMY N/A 11/01/2016   Procedure: Coronary Thrombectomy;  Surgeon: Sherren Mocha, MD;  Location: [place] [facility];  Service: Cardiovascular;  Laterality: N/A;  . INTRAVASCULAR ULTRASOUND/IVUS N/A 11/01/2016   Procedure: Intravascular Ultrasound/IVUS;  Surgeon: Sherren Mocha, MD;  Location: [place] [facility];  Service: Cardiovascular;  Laterality: N/A;  . LAMINECTOMY  1986  . LEFT HEART CATH AND CORONARY ANGIOGRAPHY N/A 11/01/2016   Procedure: Left Heart Cath and  Coronary Angiography;  Surgeon: Sherren Mocha, MD;  Location: [place] [facility];  Service: Cardiovascular;  Laterality: N/A;  . TEMPORARY PACEMAKER N/A 11/01/2016   Procedure: Temporary Pacemaker;  Surgeon: Sherren Mocha, MD;  Location: [place] [facility];  Service: Cardiovascular;  Laterality: N/A;  . TONSILLECTOMY  1981  . WISDOM TOOTH EXTRACTION      Family History  Problem Relation Age of Onset  . Uterine cancer Mother   . Heart disease Mother     Social History   Social History  . Marital status: Married    Spouse name: N/A  . Number of children: N/A  . Years of education: N/A   Occupational History  . Not on file.   Social History Main Topics  . Smoking status: Former Smoker    Packs/day: 0.50    Types: Cigarettes  . Smokeless tobacco: Never Used  . Alcohol use No  . Drug use: No  . Sexual activity: Yes   Other Topics Concern  . Not on file   Social History Narrative   RN at [employer]   Married   The PMH, [place], Social History, Family History, Medications, and allergies have been reviewed in [facility], and have been updated if relevant.  Review of Systems  Constitutional: Negative.   HENT: Negative.   Gastrointestinal: Negative.   Musculoskeletal: Negative.   Skin: Negative.   Neurological: Negative.   Hematological: Negative.   Psychiatric/Behavioral: Negative.   All other systems reviewed and are negative.      Objective:    BP 120/70   Pulse 64   Temp 98.2 F (36.8 C)   Wt 221 lb (100.2 kg)   SpO2 98%   BMI 36.78 kg/m    Physical Exam   General:  Well-developed,well-nourished,in no acute distress; alert,appropriate and cooperative throughout examination Head:  normocephalic and atraumatic.   Eyes:  vision grossly intact, PERRL Ears:  R ear normal and L ear normal externally, TMs clear bilaterally Nose:  no external deformity.   Mouth:  good dentition.   Neck:  No deformities, masses, or tenderness noted. Lungs:  Normal respiratory  effort, chest expands symmetrically. Lungs are clear to auscultation, no crackles or wheezes. Heart:  Normal rate and regular rhythm. S1 and S2 normal without gallop, murmur, click, rub or other extra sounds. Abdomen:  Bowel sounds positive,abdomen soft and non-tender without masses, organomegaly or hernias noted. Msk:  No deformity or scoliosis noted of thoracic or lumbar spine.   Extremities:  No clubbing, cyanosis, edema, or deformity noted with normal full range of motion of all joints.   Neurologic:  alert & oriented X3 and gait normal.   Skin:  Intact without suspicious lesions or rashes Cervical Nodes:  No lymphadenopathy noted Axillary Nodes:  No palpable lymphadenopathy Psych:  Cognition and  judgment appear intact. Alert and cooperative with normal attention span and concentration. No apparent delusions, illusions, hallucinations       Assessment & Plan:   Type 2 diabetes mellitus without complication, without long-term current use of insulin (HCC)  LFT elevation - Plan: Hepatic Function Panel, Gamma GT, US Abdomen Limited RUQ, Hepatitis panel, acute No Follow-up on file.

## 2017-03-24 NOTE — Assessment & Plan Note (Signed)
New- will check additional labs and RUQ ultrasound.  Will likely need to decrease her dose of lipitor as AST and ALT are above 3 times upper limits of normal.  Repeat LFTs today to make sure they are still this elevated before making changes. The patient indicates understanding of these issues and agrees with the plan.

## 2017-03-25 ENCOUNTER — Other Ambulatory Visit: Payer: Self-pay | Admitting: Family Medicine

## 2017-03-25 DIAGNOSIS — R945 Abnormal results of liver function studies: Principal | ICD-10-CM

## 2017-03-25 DIAGNOSIS — R7989 Other specified abnormal findings of blood chemistry: Secondary | ICD-10-CM

## 2017-03-25 LAB — HEPATITIS PANEL, ACUTE
HCV Ab: NEGATIVE
Hep A IgM: NONREACTIVE
Hep B C IgM: NONREACTIVE
Hepatitis B Surface Ag: NEGATIVE

## 2017-03-25 MED ORDER — ATORVASTATIN CALCIUM 20 MG PO TABS: 20.0000 mg | ORAL_TABLET | Freq: Every day | ORAL | 3 refills | Status: DC

## 2017-03-31 ENCOUNTER — Ambulatory Visit
Admission: RE | Admit: 2017-03-31 | Discharge: 2017-03-31 | Disposition: A | Discharge status: Home / Self Care | Payer: BLUE CROSS/BLUE SHIELD | Source: Ambulatory Visit | Attending: Family Medicine | Admitting: Family Medicine

## 2017-03-31 DIAGNOSIS — R945 Abnormal results of liver function studies: Secondary | ICD-10-CM | POA: Diagnosis not present

## 2017-03-31 DIAGNOSIS — R7989 Other specified abnormal findings of blood chemistry: Secondary | ICD-10-CM

## 2017-03-31 DIAGNOSIS — K760 Fatty (change of) liver, not elsewhere classified: Secondary | ICD-10-CM | POA: Insufficient documentation

## 2017-05-04 ENCOUNTER — Ambulatory Visit: Payer: BLUE CROSS/BLUE SHIELD | Admitting: Gastroenterology

## 2017-05-07 ENCOUNTER — Encounter: Payer: Self-pay | Admitting: Gastroenterology

## 2017-05-07 ENCOUNTER — Ambulatory Visit (INDEPENDENT_AMBULATORY_CARE_PROVIDER_SITE_OTHER): Payer: BLUE CROSS/BLUE SHIELD | Admitting: Gastroenterology

## 2017-05-07 VITALS — BP 106/46 | HR 73 | Temp 98.3°F | Ht 65.0 in | Wt 222.8 lb

## 2017-05-07 DIAGNOSIS — R945 Abnormal results of liver function studies: Secondary | ICD-10-CM | POA: Diagnosis not present

## 2017-05-07 DIAGNOSIS — R7989 Other specified abnormal findings of blood chemistry: Secondary | ICD-10-CM

## 2017-05-07 NOTE — Progress Notes (Signed)
Jonathon Bellows MD, MRCP(U.K) [address]  [place]  [place], [place] [address]  Main: [phone]  Fax: [phone]   Gastroenterology Consultation  Referring Provider:     Lucille Passy, MD Primary Care Physician:  Lucille Passy, MD Primary Gastroenterologist:  Dr. Jonathon Bellows  Reason for Consultation:     Abnormal LFT's         HPI:   Karen Hartman is a 61 y.o. y/o female referred for consultation & management  by Dr. Deborra Medina, Marciano Sequin, MD.     She has been referred for abnormal LFT's  First noted abnormality in LFT's in in 03/2016  . Had her statin dose decreased from 80 mg to 20 mg.  Alcohol use none,   Drug use none  Over the counter herbal supplements -none  New medications - statin,metoprolol,glucophage,brilinta  Abdominal pain occasionally - epigastric -very mild, relived by burping.  Tattoos no  Military service no  Prior blood transfusion none  Incarceration none  History of travel none  Family history of liver disease none  Recent change in weight - none  Quit smoking in 10/2016.  Does not feel ill. Around April or may had a lot of nausea and she blamed it on the medicine.  When her lipitor dose was decreased felt better.  Elevated alkaline phosphatase, ALT,AST,GGT. LFT's were normal 5 months back .Hepatitis B sAg and Hep C ab is negative. RUQ USG normal except for fatty liver.   Hepatic Function Latest Ref Rng & Units 03/24/2017 03/16/2017 12/05/2016  Total Protein 6.0 - 8.3 g/dL 7.5 6.9 7.1  Albumin 3.5 - 5.2 g/dL 4.4 4.0 4.2  AST 0 - 37 U/L 49(H) 95(H) 23  ALT 0 - 35 U/L 78(H) 138(H) 26  Alk Phosphatase 39 - 117 U/L 200(H) 235(H) 67  Total Bilirubin 0.2 - 1.2 mg/dL 0.8 0.5 0.8  Bilirubin, Direct 0.0 - 0.3 mg/dL 0.1 - -      Past Medical History:  Diagnosis Date  . CAD in native artery    a. inf/lat STEMI 10/2016 with occluded RCA s/p DES, moderate nonobstructive LAD stenosis and mild nonobstructive LCx stenosis, EF 50-55% -> taken back to cath  lab for acute stent thrombosis s/p thromectomy/PTCA of RCA same day. EF 30-35% by echo 11/02/16.   . Diabetes ([place])   . Hyperlipidemia, mixed 11/01/2016  . Ischemic cardiomyopathy   . Morbid obesity ([place])   . NSVT (nonsustained ventricular tachycardia) ([place])    a. during adm 10/2016 for STEMI.  Marland Kitchen STEMI involving right coronary artery ([place]) 11/01/2016  . Tobacco abuse   . Torsades de pointes ([place])    a. during adm 10/2016 for STEMI, felt infarct related.  . Vulvar cancer [facility])     Past Surgical History:  Procedure Laterality Date  . CARDIAC CATHETERIZATION N/A 11/01/2016   Procedure: Left Heart Cath and Coronary Angiography;  Surgeon: Sherren Mocha, MD;  Location: [place] [facility];  Service: Cardiovascular;  Laterality: N/A;  . CARDIAC CATHETERIZATION N/A 11/01/2016   Procedure: Coronary Stent Intervention;  Surgeon: Sherren Mocha, MD;  Location: [place] [facility];  Service: Cardiovascular;  Laterality: N/A;  DES to Prox RCA- Promus 3.5x28  . CESAREAN SECTION    . CORONARY THROMBECTOMY N/A 11/01/2016   Procedure: Coronary Thrombectomy;  Surgeon: Sherren Mocha, MD;  Location: [place] [facility];  Service: Cardiovascular;  Laterality: N/A;  . INTRAVASCULAR ULTRASOUND/IVUS N/A 11/01/2016   Procedure: Intravascular Ultrasound/IVUS;  Surgeon: Sherren Mocha, MD;  Location: [place] [facility];  Service: Cardiovascular;  Laterality: N/A;  . LAMINECTOMY  1986  . LEFT HEART CATH AND CORONARY ANGIOGRAPHY N/A 11/01/2016   Procedure: Left Heart Cath and Coronary Angiography;  Surgeon: Sherren Mocha, MD;  Location: [place] [facility];  Service: Cardiovascular;  Laterality: N/A;  . TEMPORARY PACEMAKER N/A 11/01/2016   Procedure: Temporary Pacemaker;  Surgeon: Sherren Mocha, MD;  Location: [place] [facility];  Service: Cardiovascular;  Laterality: N/A;  . TONSILLECTOMY  1981  . WISDOM TOOTH EXTRACTION      Prior to Admission medications   Medication Sig Start Date End Date Taking?  Authorizing Provider  acetaminophen (TYLENOL) 500 MG tablet Take 500 mg by mouth every 6 (six) hours as needed for mild pain.   Yes [provider]  aspirin EC 81 MG EC tablet Take 1 tablet (81 mg total) by mouth daily. 11/06/16  Yes Dunn, Dayna N, PA-C  calcium carbonate (TUMS - DOSED IN MG ELEMENTAL CALCIUM) 500 MG chewable tablet Chew 1 tablet by mouth daily as needed for indigestion or heartburn.   Yes [provider]  docusate sodium (COLACE) 100 MG capsule Take 100 mg by mouth every other day.    Yes [provider]  ibuprofen (ADVIL,MOTRIN) 600 MG tablet Take 600 mg by mouth. 11/11/15  Yes [provider]  metFORMIN (GLUCOPHAGE) 1000 MG tablet Take 1 tablet (1,000 mg total) by mouth 2 (two) times daily with a meal. 03/24/17  Yes Lucille Passy, MD  metoprolol succinate (TOPROL-XL) 25 MG 24 hr tablet Take 1 tablet (25 mg total) by mouth daily. 11/06/16  Yes Dunn, Dayna N, PA-C  nitroGLYCERIN (NITROSTAT) 0.4 MG SL tablet Place 1 tablet (0.4 mg total) under the tongue every 5 (five) minutes as needed for chest pain (up to 3 doses). 11/05/16  Yes Dunn, Nedra Hai, PA-C  SHINGRIX injection  02/03/17  Yes [provider]  ticagrelor (BRILINTA) 90 MG TABS tablet Take 1 tablet (90 mg total) by mouth 2 (two) times daily. 11/05/16  Yes Dunn, Dayna N, PA-C  atorvastatin (LIPITOR) 20 MG tablet Take 1 tablet (20 mg total) by mouth daily. 03/25/17   Lucille Passy, MD  atorvastatin (LIPITOR) 80 MG tablet TAKE 1 TABLET (80 MG TOTAL) BY MOUTH EVERY EVENING. 02/26/17   [provider]  lisinopril (PRINIVIL,ZESTRIL) 5 MG tablet Take 1 tablet (5 mg total) by mouth daily. 02/03/17 05/04/17  Wende Bushy, MD  metFORMIN (GLUCOPHAGE) 500 MG tablet TAKE 1 TABLET (500 MG TOTAL) BY MOUTH 2 (TWO) TIMES DAILY WITH A MEAL. 03/11/17   [provider]    Family History  Problem Relation Age of Onset  . Uterine cancer Mother   . Heart disease Mother      Social History    Substance Use Topics  . Smoking status: Former Smoker    Packs/day: 0.50    Types: Cigarettes  . Smokeless tobacco: Never Used  . Alcohol use No    Allergies as of 05/07/2017  . (No Known Allergies)    Review of Systems:    All systems reviewed and negative except where noted in HPI.   Physical Exam:  BP (!) 106/46   Pulse 73   Temp 98.3 F (36.8 C) (Oral)   Ht 5' 5"  (1.651 m)   Wt 222 lb 12.8 oz (101.1 kg)   BMI 37.08 kg/m  No LMP recorded. Patient is postmenopausal. Psych:  Alert and cooperative. Normal mood and affect. General:  Alert,  Well-developed, well-nourished, pleasant and cooperative in NAD Head:  Normocephalic and atraumatic. Eyes:  Sclera clear, no icterus.   Conjunctiva pink. Ears:  Normal auditory acuity. Nose:  No deformity, discharge, or lesions. Mouth:  No deformity or lesions,oropharynx pink & moist. Neck:  Supple; no masses or thyromegaly. Lungs:  Respirations even and unlabored.  Clear throughout to auscultation.   No wheezes, crackles, or rhonchi. No acute distress. Heart:  Regular rate and rhythm; no murmurs, clicks, rubs, or gallops. Abdomen:  Normal bowel sounds.  No bruits.  Soft, non-tender and non-distended without masses, hepatosplenomegaly or hernias noted.  No guarding or rebound tenderness.    Msk:  Symmetrical without gross deformities. Good, equal movement & strength bilaterally. Pulses:  Normal pulses noted. Extremities:  No clubbing or edema.  No cyanosis. Neurologic:  Alert and oriented x3;  grossly normal neurologically. Psych:  Alert and cooperative. Normal mood and affect.  Imaging Studies: No results found.  Assessment and Plan:   Karen Hartman is a 61 y.o. y/o female has been referred for elevated liver function tests .  Further labs necessary to look for viral hepatitis, autoimmune liver disease, Primary Biliary cirrhosis, celiac disease, muscle disorders,Primary Sclerosing Cholangitis, Hemachromatosis, Wilson's disease,  or A-1 antitrypsin deficiency.Since the pattern appears cholestatic in nature will also obtain MRCP if LFT's are no better today . Lipitor can rarely cause a cholestatic picture   Follow up in 2-4 weeks   Dr Jonathon Bellows MD,MRCP(U.K)

## 2017-05-08 ENCOUNTER — Telehealth: Payer: Self-pay

## 2017-05-08 NOTE — Telephone Encounter (Signed)
Advised patient of results per Dr. Vicente Males.   Inform LFT's have returned completely to normal . Suggest recheck in 3 months or earlier if she has symptoms that she had earlier like fatigue or muscle aches. Could be from statin - If higher dose of statin is needed suggest trying pravastatin as it is metabolized to a lesser degree in the liver and drug of choice even in patients with liver diseases

## 2017-05-08 NOTE — Telephone Encounter (Signed)
-----   Message from Jonathon Bellows, MD sent at 05/08/2017  9:53 AM EDT ----- Inform LFT's have returned completely to normal . Suggest recheck in 3 months or earlier if she has symptoms that she had earlier like fatigue or muscle aches. Could be from statin - If higher dose of statin is needed suggest trying pravastatin as it is metabolized to a lesser degree in the liver and drug of choice even in patients with liver diseases

## 2017-05-11 LAB — CK: Total CK: 69 U/L (ref 24–173)

## 2017-05-11 LAB — CBC WITH DIFFERENTIAL/PLATELET
Basophils Absolute: 0 10*3/uL (ref 0.0–0.2)
Basos: 1 %
EOS (ABSOLUTE): 0 10*3/uL (ref 0.0–0.4)
EOS: 0 %
HEMATOCRIT: 39.9 % (ref 34.0–46.6)
HEMOGLOBIN: 13.5 g/dL (ref 11.1–15.9)
IMMATURE GRANULOCYTES: 0 %
Immature Grans (Abs): 0 10*3/uL (ref 0.0–0.1)
Lymphocytes Absolute: 1.5 10*3/uL (ref 0.7–3.1)
Lymphs: 28 %
MCH: 29.7 pg (ref 26.6–33.0)
MCHC: 33.8 g/dL (ref 31.5–35.7)
MCV: 88 fL (ref 79–97)
MONOCYTES: 7 %
MONOS ABS: 0.4 10*3/uL (ref 0.1–0.9)
NEUTROS PCT: 64 %
Neutrophils Absolute: 3.6 10*3/uL (ref 1.4–7.0)
Platelets: 261 10*3/uL (ref 150–379)
RBC: 4.55 x10E6/uL (ref 3.77–5.28)
RDW: 14.3 % (ref 12.3–15.4)
WBC: 5.5 10*3/uL (ref 3.4–10.8)

## 2017-05-11 LAB — CELIAC PANEL 10
Antigliadin Abs, IgA: 4 units (ref 0–19)
ENDOMYSIAL IGA: NEGATIVE
Gliadin IgG: 3 units (ref 0–19)
IgA/Immunoglobulin A, Serum: 116 mg/dL (ref 87–352)
TISSUE TRANSGLUT AB: 3 U/mL (ref 0–5)

## 2017-05-11 LAB — ANTI-MICROSOMAL ANTIBODY LIVER / KIDNEY: LKM1 Ab: 2.7 Units (ref 0.0–20.0)

## 2017-05-11 LAB — COMPREHENSIVE METABOLIC PANEL
ALT: 19 IU/L (ref 0–32)
AST: 17 IU/L (ref 0–40)
Albumin/Globulin Ratio: 1.8 (ref 1.2–2.2)
Albumin: 4.2 g/dL (ref 3.6–4.8)
Alkaline Phosphatase: 76 IU/L (ref 39–117)
BUN/Creatinine Ratio: 21 (ref 12–28)
BUN: 13 mg/dL (ref 8–27)
Bilirubin Total: 0.6 mg/dL (ref 0.0–1.2)
CALCIUM: 9.4 mg/dL (ref 8.7–10.3)
CO2: 22 mmol/L (ref 20–29)
CREATININE: 0.63 mg/dL (ref 0.57–1.00)
Chloride: 103 mmol/L (ref 96–106)
GFR calc Af Amer: 113 mL/min/{1.73_m2} (ref >59)
GFR, EST NON AFRICAN AMERICAN: 98 mL/min/{1.73_m2} (ref >59)
GLOBULIN, TOTAL: 2.4 g/dL (ref 1.5–4.5)
Glucose: 104 mg/dL — ABNORMAL HIGH (ref 65–99)
Potassium: 4.3 mmol/L (ref 3.5–5.2)
SODIUM: 139 mmol/L (ref 134–144)
Total Protein: 6.6 g/dL (ref 6.0–8.5)

## 2017-05-11 LAB — IMMUNOGLOBULINS A/E/G/M, SERUM
IGE (IMMUNOGLOBULIN E), SERUM: 10 [IU]/mL (ref 0–100)
IgG (Immunoglobin G), Serum: 871 mg/dL (ref 700–1600)
IgM (Immunoglobulin M), Srm: 104 mg/dL (ref 26–217)

## 2017-05-11 LAB — PTH, INTACT AND CALCIUM: PTH: 38 pg/mL (ref 15–65)

## 2017-05-11 LAB — ENA+DNA/DS+SJORGEN'S
ENA RNP AB: 1.1 AI — AB (ref 0.0–0.9)
dsDNA Ab: <1 IU/mL (ref 0–9)

## 2017-05-11 LAB — ALKALINE PHOSPHATASE, ISOENZYMES
BONE FRACTION: 40 % (ref 14–68)
INTESTINAL FRAC.: 0 % (ref 0–18)
LIVER FRACTION: 60 % (ref 18–85)

## 2017-05-11 LAB — HIV ANTIBODY (ROUTINE TESTING W REFLEX): HIV SCREEN 4TH GENERATION: NONREACTIVE

## 2017-05-11 LAB — HEPATITIS B SURFACE ANTIBODY,QUALITATIVE: Hep B Surface Ab, Qual: NONREACTIVE

## 2017-05-11 LAB — MITOCHONDRIAL/SMOOTH MUSCLE AB PNL
MITOCHONDRIAL AB: 23 U — AB (ref 0.0–20.0)
SMOOTH MUSCLE AB: 51 U — AB (ref 0–19)

## 2017-05-11 LAB — ANA W/REFLEX: Anti Nuclear Antibody(ANA): POSITIVE — AB

## 2017-05-11 LAB — HEPATITIS B E ANTIGEN: Hep B E Ag: NEGATIVE

## 2017-05-11 LAB — PROTIME-INR
INR: 1 (ref 0.8–1.2)
Prothrombin Time: 10.5 s (ref 9.1–12.0)

## 2017-05-11 LAB — HEPATITIS B E ANTIBODY: Hep B E Ab: NEGATIVE

## 2017-05-11 LAB — CERULOPLASMIN: Ceruloplasmin: 22.4 mg/dL (ref 19.0–39.0)

## 2017-05-11 LAB — HEPATITIS B CORE ANTIBODY, TOTAL: Hep B Core Total Ab: NEGATIVE

## 2017-05-11 LAB — HEPATITIS A ANTIBODY, TOTAL: Hep A Total Ab: POSITIVE — AB

## 2017-05-11 LAB — ALPHA-1-ANTITRYPSIN: A1 ANTITRYPSIN: 82 mg/dL — AB (ref 90–200)

## 2017-05-12 ENCOUNTER — Telehealth: Payer: Self-pay

## 2017-05-12 NOTE — Telephone Encounter (Signed)
Advised patient of results per Dr. Vicente Males.  Inform ANA,smooth muscle abc, AMA positive- will recheck in 3 months along with LFT's. These antibodies can be positive in autopimmune hepatitis.

## 2017-05-12 NOTE — Telephone Encounter (Signed)
-----   Message from Jonathon Bellows, MD sent at 05/10/2017 12:27 PM EDT ----- Inform ANA,smooth muscle abc, AMA positive- will recheck in 3 months along with LFT's. These antibodies can be positive in autopimmune hepatitis  [place]

## 2017-05-22 ENCOUNTER — Other Ambulatory Visit: Payer: Self-pay | Admitting: Physician Assistant

## 2017-05-27 ENCOUNTER — Other Ambulatory Visit: Payer: Self-pay | Admitting: Physician Assistant

## 2017-05-27 NOTE — Telephone Encounter (Signed)
[facility] [employer]. Please advise if ok to refill.

## 2017-07-15 ENCOUNTER — Other Ambulatory Visit: Payer: Self-pay | Admitting: Family Medicine

## 2017-07-16 ENCOUNTER — Other Ambulatory Visit: Payer: Self-pay | Admitting: Family Medicine

## 2017-07-16 ENCOUNTER — Other Ambulatory Visit: Payer: Self-pay

## 2017-07-16 MED ORDER — METFORMIN HCL 1000 MG PO TABS: 1000.0000 mg | ORAL_TABLET | Freq: Two times a day (BID) | ORAL | 0 refills | Status: DC

## 2017-07-16 NOTE — Telephone Encounter (Signed)
Rx for metformin 1000 mg sent to [facility]. Patient will need to schedule OV for labs and further refills

## 2017-08-24 ENCOUNTER — Encounter: Payer: Self-pay | Admitting: Family Medicine

## 2017-08-24 ENCOUNTER — Other Ambulatory Visit (HOSPITAL_COMMUNITY)
Admission: RE | Admit: 2017-08-24 | Discharge: 2017-08-24 | Disposition: A | Discharge status: Home / Self Care | Payer: BLUE CROSS/BLUE SHIELD | Source: Ambulatory Visit | Attending: Family Medicine | Admitting: Family Medicine

## 2017-08-24 ENCOUNTER — Ambulatory Visit (INDEPENDENT_AMBULATORY_CARE_PROVIDER_SITE_OTHER): Payer: BLUE CROSS/BLUE SHIELD | Admitting: Family Medicine

## 2017-08-24 VITALS — BP 114/68 | HR 76 | Temp 98.3°F | Ht 65.0 in | Wt 216.0 lb

## 2017-08-24 DIAGNOSIS — Z124 Encounter for screening for malignant neoplasm of cervix: Secondary | ICD-10-CM | POA: Insufficient documentation

## 2017-08-24 DIAGNOSIS — R7989 Other specified abnormal findings of blood chemistry: Secondary | ICD-10-CM

## 2017-08-24 DIAGNOSIS — R945 Abnormal results of liver function studies: Secondary | ICD-10-CM

## 2017-08-24 DIAGNOSIS — I2111 ST elevation (STEMI) myocardial infarction involving right coronary artery: Secondary | ICD-10-CM | POA: Diagnosis not present

## 2017-08-24 DIAGNOSIS — R87810 Cervical high risk human papillomavirus (HPV) DNA test positive: Secondary | ICD-10-CM | POA: Diagnosis not present

## 2017-08-24 DIAGNOSIS — Z23 Encounter for immunization: Secondary | ICD-10-CM | POA: Diagnosis not present

## 2017-08-24 DIAGNOSIS — E119 Type 2 diabetes mellitus without complications: Secondary | ICD-10-CM | POA: Diagnosis not present

## 2017-08-24 DIAGNOSIS — Z01419 Encounter for gynecological examination (general) (routine) without abnormal findings: Secondary | ICD-10-CM | POA: Diagnosis not present

## 2017-08-24 DIAGNOSIS — E782 Mixed hyperlipidemia: Secondary | ICD-10-CM

## 2017-08-24 DIAGNOSIS — C519 Malignant neoplasm of vulva, unspecified: Secondary | ICD-10-CM | POA: Diagnosis not present

## 2017-08-24 DIAGNOSIS — Z1211 Encounter for screening for malignant neoplasm of colon: Secondary | ICD-10-CM

## 2017-08-24 LAB — CBC WITH DIFFERENTIAL/PLATELET
BASOS PCT: 0.7 % (ref 0.0–3.0)
Basophils Absolute: 0 10*3/uL (ref 0.0–0.1)
EOS PCT: 0.4 % (ref 0.0–5.0)
Eosinophils Absolute: 0 10*3/uL (ref 0.0–0.7)
HCT: 40.4 % (ref 36.0–46.0)
HEMOGLOBIN: 13.7 g/dL (ref 12.0–15.0)
LYMPHS ABS: 1.1 10*3/uL (ref 0.7–4.0)
Lymphocytes Relative: 22.7 % (ref 12.0–46.0)
MCHC: 33.8 g/dL (ref 30.0–36.0)
MCV: 91.4 fl (ref 78.0–100.0)
MONO ABS: 0.3 10*3/uL (ref 0.1–1.0)
MONOS PCT: 7.2 % (ref 3.0–12.0)
Neutro Abs: 3.2 10*3/uL (ref 1.4–7.7)
Neutrophils Relative %: 69 % (ref 43.0–77.0)
Platelets: 227 10*3/uL (ref 150.0–400.0)
RBC: 4.42 Mil/uL (ref 3.87–5.11)
RDW: 14.6 % (ref 11.5–15.5)
WBC: 4.7 10*3/uL (ref 4.0–10.5)

## 2017-08-24 LAB — COMPREHENSIVE METABOLIC PANEL
ALBUMIN: 4.1 g/dL (ref 3.5–5.2)
ALK PHOS: 50 U/L (ref 39–117)
ALT: 16 U/L (ref 0–35)
AST: 16 U/L (ref 0–37)
BUN: 15 mg/dL (ref 6–23)
CALCIUM: 9.3 mg/dL (ref 8.4–10.5)
CHLORIDE: 104 meq/L (ref 96–112)
CO2: 27 mEq/L (ref 19–32)
Creatinine, Ser: 0.68 mg/dL (ref 0.40–1.20)
GFR: 93.69 mL/min (ref >60.00)
Glucose, Bld: 123 mg/dL — ABNORMAL HIGH (ref 70–99)
POTASSIUM: 4.3 meq/L (ref 3.5–5.1)
SODIUM: 139 meq/L (ref 135–145)
TOTAL PROTEIN: 6.6 g/dL (ref 6.0–8.3)
Total Bilirubin: 0.6 mg/dL (ref 0.2–1.2)

## 2017-08-24 LAB — LIPID PANEL
CHOLESTEROL: 128 mg/dL (ref 0–200)
HDL: 56.5 mg/dL (ref >39.00)
LDL CALC: 63 mg/dL (ref 0–99)
NonHDL: 71.82
TRIGLYCERIDES: 46 mg/dL (ref 0.0–149.0)
Total CHOL/HDL Ratio: 2
VLDL: 9.2 mg/dL (ref 0.0–40.0)

## 2017-08-24 LAB — HEMOGLOBIN A1C: Hgb A1c MFr Bld: 6.4 % (ref 4.6–6.5)

## 2017-08-24 LAB — MICROALBUMIN / CREATININE URINE RATIO
CREATININE, U: 157.4 mg/dL
MICROALB/CREAT RATIO: 0.6 mg/g (ref 0.0–30.0)
Microalb, Ur: 0.9 mg/dL (ref 0.0–1.9)

## 2017-08-24 LAB — TSH: TSH: 1.81 u[IU]/mL (ref 0.35–4.50)

## 2017-08-24 NOTE — Assessment & Plan Note (Signed)
Continue current rxs Labs today. 

## 2017-08-24 NOTE — Assessment & Plan Note (Signed)
Continue current rx. Labs today. 

## 2017-08-24 NOTE — Assessment & Plan Note (Signed)
Pap smear done today

## 2017-08-24 NOTE — Patient Instructions (Signed)
Great to see you.  We will all you with your results from today and you can view them online.  Please call to schedule your mammogram.

## 2017-08-24 NOTE — Addendum Note (Signed)
Addended by: Marrion Coy on: 08/24/2017 09:16 AM   Modules accepted: Orders

## 2017-08-24 NOTE — Assessment & Plan Note (Signed)
Reviewed preventive care protocols, scheduled due services, and updated immunizations Discussed nutrition, exercise, diet, and healthy lifestyle.  

## 2017-08-24 NOTE — Assessment & Plan Note (Signed)
Followed by cardiology. Continue current rxs.

## 2017-08-24 NOTE — Addendum Note (Signed)
Addended by: Ellamae Sia on: 08/24/2017 09:22 AM   Modules accepted: Orders

## 2017-08-24 NOTE — Progress Notes (Signed)
Subjective:   Patient ID: Karen Hartman, female    DOB: July 19, 1956, 61 y.o.   MRN: [id number]  Karen Hartman is a pleasant 61 y.o. year old female who presents to clinic today with Annual Exam (Patient is here today for a fasting CPE.  She is not due for PAP per records but wants to make sure that due to her having had a vulvectomy in 2016 for squamous cells if that is still to wait until 8.2019.  Does not want referral to GI but is willing to have Cologuard testing done.  Will need mammogram and eye exam and Tdap.,)  on 08/24/2017  HPI:  Due for mammogram. S/p vulvectomy in 2016 for squamous cells- due for pap 06/2018.  CAD- She is on Lisinopril, metoprolol,  ASA and Brilinta. Followed by cardiology, Dr. Yvone Neu. Last saw he on 03/10/17. Note reviewed. Plan is one year of ASA/brilinta therapy.   DM- Does not check FSBS regularly.  She has been compliant with Metformin 500 mg twice daily.  Lab Results  Component Value Date   HGBA1C 7.9 (H) 03/16/2017   Lab Results  Component Value Date   CHOL 122 03/16/2017   HDL 54.90 03/16/2017   LDLCALC 59 03/16/2017   TRIG 41.0 03/16/2017   CHOLHDL 2 03/16/2017    Lab Results  Component Value Date   TSH 2.608 11/01/2016   Lab Results  Component Value Date   WBC 5.5 05/07/2017   HGB 13.5 05/07/2017   HCT 39.9 05/07/2017   MCV 88 05/07/2017   PLT 261 05/07/2017    Current Outpatient Prescriptions on File Prior to Visit  Medication Sig Dispense Refill  . acetaminophen (TYLENOL) 500 MG tablet Take 500 mg by mouth every 6 (six) hours as needed for mild pain.    Marland Kitchen aspirin EC 81 MG EC tablet Take 1 tablet (81 mg total) by mouth daily. 90 tablet 3  . atorvastatin (LIPITOR) 20 MG tablet Take 1 tablet (20 mg total) by mouth daily. (Patient not taking: Reported on 05/07/2017) 90 tablet 3  . calcium carbonate (TUMS - DOSED IN MG ELEMENTAL CALCIUM) 500 MG chewable tablet Chew 1 tablet by mouth daily as needed for indigestion or heartburn.     . docusate sodium (COLACE) 100 MG capsule Take 100 mg by mouth every other day.     . lisinopril (PRINIVIL,ZESTRIL) 5 MG tablet Take 1 tablet (5 mg total) by mouth daily. 90 tablet 1  . metFORMIN (GLUCOPHAGE) 1000 MG tablet Take 1 tablet (1,000 mg total) by mouth 2 (two) times daily with a meal. 60 tablet 0  . metoprolol succinate (TOPROL-XL) 25 MG 24 hr tablet TAKE 1 TABLET (25 MG TOTAL) BY MOUTH DAILY. 30 tablet 6  . nitroGLYCERIN (NITROSTAT) 0.4 MG SL tablet Place 1 tablet (0.4 mg total) under the tongue every 5 (five) minutes as needed for chest pain (up to 3 doses). 25 tablet 3  . ticagrelor (BRILINTA) 90 MG TABS tablet Take 1 tablet (90 mg total) by mouth 2 (two) times daily. 180 tablet 3   No current facility-administered medications on file prior to visit.     No Known Allergies  Past Medical History:  Diagnosis Date  . CAD in native artery    a. inf/lat STEMI 10/2016 with occluded RCA s/p DES, moderate nonobstructive LAD stenosis and mild nonobstructive LCx stenosis, EF 50-55% -> taken back to cath lab for acute stent thrombosis s/p thromectomy/PTCA of RCA same day. EF 30-35% by echo 11/02/16.   Marland Kitchen  Diabetes ([place])   . Hyperlipidemia, mixed 11/01/2016  . Ischemic cardiomyopathy   . Morbid obesity ([place])   . NSVT (nonsustained ventricular tachycardia) ([place])    a. during adm 10/2016 for STEMI.  Marland Kitchen STEMI involving right coronary artery ([place]) 11/01/2016  . Tobacco abuse   . Torsades de pointes ([place])    a. during adm 10/2016 for STEMI, felt infarct related.  . Vulvar cancer [facility])     Past Surgical History:  Procedure Laterality Date  . CARDIAC CATHETERIZATION N/A 11/01/2016   Procedure: Left Heart Cath and Coronary Angiography;  Surgeon: Sherren Mocha, MD;  Location: [place] CV LAB;  Service: Cardiovascular;  Laterality: N/A;  . CARDIAC CATHETERIZATION N/A 11/01/2016   Procedure: Coronary Stent Intervention;  Surgeon: Sherren Mocha, MD;  Location: [place] CV LAB;   Service: Cardiovascular;  Laterality: N/A;  DES to Prox RCA- Promus 3.5x28  . CESAREAN SECTION    . CORONARY THROMBECTOMY N/A 11/01/2016   Procedure: Coronary Thrombectomy;  Surgeon: Sherren Mocha, MD;  Location: [place] CV LAB;  Service: Cardiovascular;  Laterality: N/A;  . INTRAVASCULAR ULTRASOUND/IVUS N/A 11/01/2016   Procedure: Intravascular Ultrasound/IVUS;  Surgeon: Sherren Mocha, MD;  Location: [place] CV LAB;  Service: Cardiovascular;  Laterality: N/A;  . LAMINECTOMY  1986  . LEFT HEART CATH AND CORONARY ANGIOGRAPHY N/A 11/01/2016   Procedure: Left Heart Cath and Coronary Angiography;  Surgeon: Sherren Mocha, MD;  Location: [place] CV LAB;  Service: Cardiovascular;  Laterality: N/A;  . TEMPORARY PACEMAKER N/A 11/01/2016   Procedure: Temporary Pacemaker;  Surgeon: Sherren Mocha, MD;  Location: [place] CV LAB;  Service: Cardiovascular;  Laterality: N/A;  . TONSILLECTOMY  1981  . WISDOM TOOTH EXTRACTION      Family History  Problem Relation Age of Onset  . Uterine cancer Mother   . Heart disease Mother     Social History   Social History  . Marital status: Married    Spouse name: N/A  . Number of children: N/A  . Years of education: N/A   Occupational History  . Not on file.   Social History Main Topics  . Smoking status: Former Smoker    Packs/day: 0.50    Types: Cigarettes  . Smokeless tobacco: Never Used  . Alcohol use No  . Drug use: No  . Sexual activity: Yes   Other Topics Concern  . Not on file   Social History Narrative   RN at [employer]   Married   The PMH, [place], Social History, Family History, Medications, and allergies have been reviewed in [facility], and have been updated if relevant.   Review of Systems  Constitutional: Negative.   HENT: Negative.   Eyes: Negative.   Respiratory: Negative.   Cardiovascular: Negative.   Gastrointestinal: Negative.   Endocrine: Negative.   Genitourinary: Negative.   Musculoskeletal: Negative.     Skin: Negative.   Allergic/Immunologic: Negative.   Neurological: Negative.   Hematological: Negative.   Psychiatric/Behavioral: Negative.   All other systems reviewed and are negative.      Objective:    BP 114/68 (BP Location: Right Arm, Patient Position: Sitting, Cuff Size: Large)   Pulse 76   Temp 98.3 F (36.8 C) (Oral)   Ht 5\' 5"  (1.651 m)   Wt 216 lb (98 kg)   SpO2 95%   BMI 35.94 kg/m    Physical Exam    General:  Well-developed,well-nourished,in no acute distress; alert,appropriate and cooperative throughout examination Head:  normocephalic and atraumatic.  Eyes:  vision grossly intact, PERRL Ears:  R ear normal and L ear normal externally, TMs clear bilaterally Nose:  no external deformity.   Mouth:  good dentition.   Neck:  No deformities, masses, or tenderness noted. Breasts:  No mass, nodules, thickening, tenderness, bulging, retraction, inflamation, nipple discharge or skin changes noted.   Lungs:  Normal respiratory effort, chest expands symmetrically. Lungs are clear to auscultation, no crackles or wheezes. Heart:  Normal rate and regular rhythm. S1 and S2 normal without gallop, murmur, click, rub or other extra sounds. Abdomen:  Bowel sounds positive,abdomen soft and non-tender without masses, organomegaly or hernias noted. Rectal:  no external abnormalities.   Genitalia:  Pelvic Exam:        External: normal female genitalia without lesions or masses        Vagina: normal without lesions or masses        Cervix: normal without lesions or masses        Adnexa: normal bimanual exam without masses or fullness        Uterus: normal by palpation        Pap smear: performed Msk:  No deformity or scoliosis noted of thoracic or lumbar spine.   Extremities:  No clubbing, cyanosis, edema, or deformity noted with normal full range of motion of all joints.   Neurologic:  alert & oriented X3 and gait normal.   Skin:  Intact without suspicious lesions or  rashes Cervical Nodes:  No lymphadenopathy noted Axillary Nodes:  No palpable lymphadenopathy Psych:  Cognition and judgment appear intact. Alert and cooperative with normal attention span and concentration. No apparent delusions, illusions, hallucinations      Assessment & Plan:   Well woman exam - Plan: CBC with Differential/Platelet, Comprehensive metabolic panel, TSH  Acute ST elevation myocardial infarction (STEMI) involving right coronary artery (HCC)  LFT elevation  Hyperlipidemia, mixed  Type 2 diabetes mellitus without complication, without long-term current use of insulin ([place]) - Plan: Lipid panel, Hemoglobin A1c, Microalbumin / creatinine urine ratio  Screening for malignant neoplasm of colon - Plan: Fecal occult blood, imunochemical No Follow-up on file.

## 2017-08-26 ENCOUNTER — Other Ambulatory Visit: Payer: Self-pay

## 2017-08-26 MED ORDER — LISINOPRIL 5 MG PO TABS: 5.0000 mg | ORAL_TABLET | Freq: Every day | ORAL | 0 refills | Status: DC

## 2017-08-27 LAB — CYTOLOGY - PAP
DIAGNOSIS: NEGATIVE
HPV 16/18/45 GENOTYPING: NEGATIVE
HPV: DETECTED — AB

## 2017-09-01 ENCOUNTER — Other Ambulatory Visit (INDEPENDENT_AMBULATORY_CARE_PROVIDER_SITE_OTHER): Payer: BLUE CROSS/BLUE SHIELD

## 2017-09-01 DIAGNOSIS — Z1211 Encounter for screening for malignant neoplasm of colon: Secondary | ICD-10-CM

## 2017-09-01 LAB — FECAL OCCULT BLOOD, IMMUNOCHEMICAL: Fecal Occult Bld: NEGATIVE

## 2017-09-02 ENCOUNTER — Other Ambulatory Visit: Payer: Self-pay | Admitting: Family Medicine

## 2017-09-02 DIAGNOSIS — Z1231 Encounter for screening mammogram for malignant neoplasm of breast: Secondary | ICD-10-CM

## 2017-09-04 ENCOUNTER — Other Ambulatory Visit: Payer: Self-pay

## 2017-09-04 MED ORDER — METFORMIN HCL 1000 MG PO TABS: 1000.0000 mg | ORAL_TABLET | Freq: Two times a day (BID) | ORAL | 1 refills | Status: DC

## 2017-09-23 ENCOUNTER — Ambulatory Visit
Admission: RE | Admit: 2017-09-23 | Discharge: 2017-09-23 | Disposition: A | Discharge status: Home / Self Care | Payer: BLUE CROSS/BLUE SHIELD | Source: Ambulatory Visit | Attending: Family Medicine | Admitting: Family Medicine

## 2017-09-23 DIAGNOSIS — Z1231 Encounter for screening mammogram for malignant neoplasm of breast: Secondary | ICD-10-CM | POA: Diagnosis not present

## 2017-09-25 ENCOUNTER — Ambulatory Visit: Payer: BLUE CROSS/BLUE SHIELD | Admitting: Cardiovascular Disease

## 2017-10-20 ENCOUNTER — Ambulatory Visit: Payer: BLUE CROSS/BLUE SHIELD | Admitting: Cardiovascular Disease

## 2017-10-20 DIAGNOSIS — B977 Papillomavirus as the cause of diseases classified elsewhere: Secondary | ICD-10-CM | POA: Diagnosis not present

## 2017-10-20 DIAGNOSIS — Z9889 Other specified postprocedural states: Secondary | ICD-10-CM | POA: Diagnosis not present

## 2017-10-20 DIAGNOSIS — Z8589 Personal history of malignant neoplasm of other organs and systems: Secondary | ICD-10-CM | POA: Diagnosis not present

## 2017-10-20 DIAGNOSIS — Z08 Encounter for follow-up examination after completed treatment for malignant neoplasm: Secondary | ICD-10-CM | POA: Diagnosis not present

## 2017-10-20 DIAGNOSIS — Z6836 Body mass index (BMI) 36.0-36.9, adult: Secondary | ICD-10-CM | POA: Diagnosis not present

## 2017-10-20 DIAGNOSIS — R87619 Unspecified abnormal cytological findings in specimens from cervix uteri: Secondary | ICD-10-CM | POA: Insufficient documentation

## 2017-10-20 DIAGNOSIS — E119 Type 2 diabetes mellitus without complications: Secondary | ICD-10-CM | POA: Diagnosis not present

## 2017-10-20 HISTORY — DX: Papillomavirus as the cause of diseases classified elsewhere: B97.7

## 2017-10-21 ENCOUNTER — Ambulatory Visit: Payer: BLUE CROSS/BLUE SHIELD | Admitting: Cardiovascular Disease

## 2017-10-21 ENCOUNTER — Encounter: Payer: Self-pay | Admitting: Cardiovascular Disease

## 2017-10-21 VITALS — BP 110/62 | HR 77 | Ht 65.0 in | Wt 221.0 lb

## 2017-10-21 DIAGNOSIS — I255 Ischemic cardiomyopathy: Secondary | ICD-10-CM | POA: Diagnosis not present

## 2017-10-21 DIAGNOSIS — I472 Ventricular tachycardia: Secondary | ICD-10-CM | POA: Diagnosis not present

## 2017-10-21 DIAGNOSIS — E782 Mixed hyperlipidemia: Secondary | ICD-10-CM

## 2017-10-21 DIAGNOSIS — E1159 Type 2 diabetes mellitus with other circulatory complications: Secondary | ICD-10-CM | POA: Diagnosis not present

## 2017-10-21 DIAGNOSIS — I4729 Other ventricular tachycardia: Secondary | ICD-10-CM

## 2017-10-21 DIAGNOSIS — I2510 Atherosclerotic heart disease of native coronary artery without angina pectoris: Secondary | ICD-10-CM

## 2017-10-21 MED ORDER — TICAGRELOR 60 MG PO TABS: 60.0000 mg | ORAL_TABLET | Freq: Two times a day (BID) | ORAL | 3 refills | Status: DC

## 2017-10-21 NOTE — Patient Instructions (Addendum)
Medication Instructions:   Please decrease the brilinta down to 60 mg twice a day when you run out of the 90 mg pills  Labwork:  No new labs needed  Testing/Procedures:  No further testing at this time   Follow-Up: It was a pleasure seeing you in the office today. Please call us if you have new issues that need to be addressed before your next appt.  [phone]  Your physician wants you to follow-up in: 12 months.  You will receive a reminder letter in the mail two months in advance. If you don't receive a letter, please call our office to schedule the follow-up appointment.  If you need a refill on your cardiac medications before your next appointment, please call your pharmacy.

## 2017-10-21 NOTE — Progress Notes (Signed)
Cardiology Office Note  Date:  10/21/2017   ID:  [name], [name] [birth year], MRN [id number]  PCP:  Lucille Passy, MD   Chief Complaint  Patient presents with  . OTHER    6 month f/u c/o edema ankles. Meds reviewed verbally with pt.    HPI:  Karen Hartman a 61 y.o.femalewith history of  morbid obesity,  DM (previously treated with weight loss),  tobacco abuse,  vulvar cancer s/p prior surgery,  CAD with inf-lat STEMI 10/2016 s/p PCI to RCA  who presents for f/u of her coronary artery disease  After her ST elevation and stent to the RCA, she had recurrent ST elevation concerning for acute stent thrombosis.   treated with Aggrestat and taken back to the cath lab urgently which showed acute stent thrombosis which was treated successfully with aspiration thrombectomy, angiojet thrombectomy, and PTCA reloaded with an additional 180mg  of Brilinta, continued on Aggrestat for 18 hours. felt this event possibly happened due to her delay in digesting Brilinta.   troponin peaked at 29.   2D echo 11/02/16 showed EF 30-35%, grade 1 DD, mild MR, moderately dilated RV, moderate RV dysfunction, akinesis of the basal and mid inferoseptal, inferior, inferolateral walls, and apical inferior and septal walls.    runs of NSVT as well as Torsades (18 seconds) on telemetry.    treated with heparin, amiodarone and lidocaine.   LifeVest was recommended with repeat echo 30 days - if EF >35 and no shocks, could discontinue Lifevest at that time.   Since last visit, patient has been doing well. No chest pains or shortness of breath. No lightheadedness or syncope. She is due to see her PCP next week. She feels that her glucose control has improved.  Feels well Stopped smoking, 10/2016 HBA1C 6.4 Total chol 128, LDL 63 on lipitor 20  Wants to lose weight Walking but not regular exercise, still working, active, Trying to watch her diet but does like sweets and bread Denies any chest pain  concerning for angina on exertion  EKG personally reviewed by myself on todays visit Shows normal sinus rhythm rate 77 bpm no significant ST or T wave changes   PMH:   has a past medical history of CAD in native artery, Diabetes ([place]), Hyperlipidemia, mixed (11/01/2016), Ischemic cardiomyopathy, Morbid obesity ([place]), NSVT (nonsustained ventricular tachycardia) ([place]), STEMI involving right coronary artery ([place]) (11/01/2016), Tobacco abuse, Torsades de pointes ([place]), and Vulvar cancer ([place]).  PSH:    Past Surgical History:  Procedure Laterality Date  . CARDIAC CATHETERIZATION N/A 11/01/2016   Procedure: Left Heart Cath and Coronary Angiography;  Surgeon: Sherren Mocha, MD;  Location: [place] CV LAB;  Service: Cardiovascular;  Laterality: N/A;  . CARDIAC CATHETERIZATION N/A 11/01/2016   Procedure: Coronary Stent Intervention;  Surgeon: Sherren Mocha, MD;  Location: [place] CV LAB;  Service: Cardiovascular;  Laterality: N/A;  DES to Prox RCA- Promus 3.5x28  . CESAREAN SECTION    . CORONARY THROMBECTOMY N/A 11/01/2016   Procedure: Coronary Thrombectomy;  Surgeon: Sherren Mocha, MD;  Location: [place] CV LAB;  Service: Cardiovascular;  Laterality: N/A;  . INTRAVASCULAR ULTRASOUND/IVUS N/A 11/01/2016   Procedure: Intravascular Ultrasound/IVUS;  Surgeon: Sherren Mocha, MD;  Location: [place] CV LAB;  Service: Cardiovascular;  Laterality: N/A;  . LAMINECTOMY  1986  . LEFT HEART CATH AND CORONARY ANGIOGRAPHY N/A 11/01/2016   Procedure: Left Heart Cath and Coronary Angiography;  Surgeon: Sherren Mocha, MD;  Location: [place] CV LAB;  Service:  Cardiovascular;  Laterality: N/A;  . TEMPORARY PACEMAKER N/A 11/01/2016   Procedure: Temporary Pacemaker;  Surgeon: Sherren Mocha, MD;  Location: [place] CV LAB;  Service: Cardiovascular;  Laterality: N/A;  . TONSILLECTOMY  1981  . WISDOM TOOTH EXTRACTION      Current Outpatient Medications  Medication Sig Dispense Refill  .  acetaminophen (TYLENOL) 500 MG tablet Take 500 mg by mouth every 6 (six) hours as needed for mild pain.    Marland Kitchen aspirin EC 81 MG EC tablet Take 1 tablet (81 mg total) by mouth daily. 90 tablet 3  . atorvastatin (LIPITOR) 20 MG tablet Take 1 tablet (20 mg total) by mouth daily. 90 tablet 3  . calcium carbonate (TUMS - DOSED IN MG ELEMENTAL CALCIUM) 500 MG chewable tablet Chew 1 tablet by mouth daily as needed for indigestion or heartburn.    . docusate sodium (COLACE) 100 MG capsule Take 100 mg by mouth every other day.     . lisinopril (PRINIVIL,ZESTRIL) 5 MG tablet Take 1 tablet (5 mg total) by mouth daily. 90 tablet 0  . metFORMIN (GLUCOPHAGE) 1000 MG tablet Take 1 tablet (1,000 mg total) by mouth 2 (two) times daily with a meal. 180 tablet 1  . metoprolol succinate (TOPROL-XL) 25 MG 24 hr tablet TAKE 1 TABLET (25 MG TOTAL) BY MOUTH DAILY. 30 tablet 6  . nitroGLYCERIN (NITROSTAT) 0.4 MG SL tablet Place 1 tablet (0.4 mg total) under the tongue every 5 (five) minutes as needed for chest pain (up to 3 doses). 25 tablet 3  . ticagrelor (BRILINTA) 60 MG TABS tablet Take 1 tablet (60 mg total) by mouth 2 (two) times daily. 180 tablet 3   No current facility-administered medications for this visit.      Allergies:   Patient has no known allergies.   Social History:  The patient  reports that she has quit smoking. Her smoking use included cigarettes. She smoked 0.50 packs per day. she has never used smokeless tobacco. She reports that she does not drink alcohol or use drugs.   Family History:   family history includes Heart disease in her mother; Uterine cancer in her mother.    Review of Systems: Review of Systems  Constitutional: Negative.   Respiratory: Negative.   Cardiovascular: Negative.   Gastrointestinal: Negative.   Musculoskeletal: Negative.   Neurological: Negative.   Psychiatric/Behavioral: Negative.   All other systems reviewed and are negative.    PHYSICAL EXAM: VS:  BP  110/62 (BP Location: Left Arm, Patient Position: Sitting, Cuff Size: Large)   Pulse 77   Ht 5\' 5"  (1.651 m)   Wt 221 lb (100.2 kg)   BMI 36.78 kg/m  , BMI Body mass index is 36.78 kg/m. GEN: Well nourished, well developed, in no acute distress  HEENT: normal  Neck: no JVD, carotid bruits, or masses Cardiac: RRR; no murmurs, rubs, or gallops,no edema  Respiratory:  clear to auscultation bilaterally, normal work of breathing GI: soft, nontender, nondistended, + BS MS: no deformity or atrophy  Skin: warm and dry, no rash Neuro:  Strength and sensation are intact Psych: euthymic mood, full affect    Recent Labs: 11/01/2016: B Natriuretic Peptide 92.0 11/03/2016: Magnesium 1.9 08/24/2017: ALT 16; BUN 15; Creatinine, Ser 0.68; Hemoglobin 13.7; Platelets 227.0; Potassium 4.3; Sodium 139; TSH 1.81    Lipid Panel Lab Results  Component Value Date   CHOL 128 08/24/2017   HDL 56.50 08/24/2017   LDLCALC 63 08/24/2017   TRIG 46.0 08/24/2017  Wt Readings from Last 3 Encounters:  10/21/17 221 lb (100.2 kg)  08/24/17 216 lb (98 kg)  05/07/17 222 lb 12.8 oz (101.1 kg)       ASSESSMENT AND PLAN:  Cardiomyopathy, ischemic - Plan: EKG 12-Lead She had full recovery and ejection fraction most recently 50-55% in January 2018 Continue current medications  NSVT (nonsustained ventricular tachycardia) (HCC) Denies any palpitations concerning for nonsustained VT Previously appreciated arrhythmia, none recently On beta-blocker  Hyperlipidemia, mixed Had problem on Lipitor 80, elevated LFTs Stable on Lipitor 20, lipids at goal  CAD in native artery Currently with no symptoms of angina. No further workup at this time.  Recommended she decrease her Brilinta down to 60 mg twice daily  Type 2 diabetes mellitus with other circulatory complication, without long-term current use of insulin (HCC) Recommend she avoid carbohydrates, increase her walking for weight loss  Disposition:    F/U  12 months   Total encounter time more than 25 minutes  Greater than 50% was spent in counseling and coordination of care with the patient    Orders Placed This Encounter  Procedures  . EKG 12-Lead     Signed, Esmond Plants, M.D., Ph.D. 10/21/2017  [place], [place]

## 2017-11-19 ENCOUNTER — Other Ambulatory Visit: Payer: Self-pay | Admitting: Physician Assistant

## 2017-11-30 ENCOUNTER — Other Ambulatory Visit: Payer: Self-pay | Admitting: Cardiovascular Disease

## 2017-12-01 ENCOUNTER — Other Ambulatory Visit: Payer: Self-pay | Admitting: Cardiovascular Disease

## 2017-12-03 ENCOUNTER — Other Ambulatory Visit: Payer: Self-pay | Admitting: Cardiovascular Disease

## 2017-12-14 ENCOUNTER — Other Ambulatory Visit: Payer: Self-pay | Admitting: *Deleted

## 2017-12-14 MED ORDER — METOPROLOL SUCCINATE ER 25 MG PO TB24: 25.0000 mg | ORAL_TABLET | Freq: Every day | ORAL | 3 refills | Status: DC

## 2018-03-03 DIAGNOSIS — Z85828 Personal history of other malignant neoplasm of skin: Secondary | ICD-10-CM | POA: Diagnosis not present

## 2018-03-03 DIAGNOSIS — Z08 Encounter for follow-up examination after completed treatment for malignant neoplasm: Secondary | ICD-10-CM | POA: Diagnosis not present

## 2018-03-03 DIAGNOSIS — L821 Other seborrheic keratosis: Secondary | ICD-10-CM | POA: Diagnosis not present

## 2018-04-03 ENCOUNTER — Other Ambulatory Visit: Payer: Self-pay | Admitting: Family Medicine

## 2018-05-10 ENCOUNTER — Other Ambulatory Visit: Payer: Self-pay | Admitting: Family Medicine

## 2018-05-12 ENCOUNTER — Encounter: Payer: Self-pay | Admitting: Family Medicine

## 2018-05-12 DIAGNOSIS — E119 Type 2 diabetes mellitus without complications: Secondary | ICD-10-CM | POA: Diagnosis not present

## 2018-05-12 LAB — HM DIABETES EYE EXAM

## 2018-05-14 ENCOUNTER — Other Ambulatory Visit: Payer: Self-pay | Admitting: Cardiovascular Disease

## 2018-09-13 ENCOUNTER — Ambulatory Visit (INDEPENDENT_AMBULATORY_CARE_PROVIDER_SITE_OTHER): Payer: BLUE CROSS/BLUE SHIELD | Admitting: Family Medicine

## 2018-09-13 ENCOUNTER — Encounter: Payer: Self-pay | Admitting: Family Medicine

## 2018-09-13 VITALS — BP 132/66 | HR 66 | Temp 98.2°F | Ht 65.0 in | Wt 221.0 lb

## 2018-09-13 DIAGNOSIS — Z1239 Encounter for other screening for malignant neoplasm of breast: Secondary | ICD-10-CM | POA: Diagnosis not present

## 2018-09-13 DIAGNOSIS — Z0000 Encounter for general adult medical examination without abnormal findings: Secondary | ICD-10-CM

## 2018-09-13 DIAGNOSIS — E1159 Type 2 diabetes mellitus with other circulatory complications: Secondary | ICD-10-CM

## 2018-09-13 DIAGNOSIS — C519 Malignant neoplasm of vulva, unspecified: Secondary | ICD-10-CM

## 2018-09-13 DIAGNOSIS — E782 Mixed hyperlipidemia: Secondary | ICD-10-CM

## 2018-09-13 NOTE — Assessment & Plan Note (Signed)
Followed by [facility] Gyn On[facility].

## 2018-09-13 NOTE — Progress Notes (Signed)
Subjective:   Patient ID: Karen Hartman, female    DOB: 1956/01/14, 62 y.o.   MRN: [id number]  Karen Hartman is a pleasant 62 y.o. year old female who presents to clinic today with Annual Exam (Doing well-has an appointment with oncologist in December. )  on 09/13/2018  HPI:  Health Maintenance  Topic Date Due  . COLONOSCOPY  04/14/2007  . HEMOGLOBIN A1C  02/22/2018  . FOOT EXAM  03/16/2018  . OPHTHALMOLOGY EXAM  05/13/2019  . MAMMOGRAM  09/24/2019  . PAP SMEAR  08/24/2020  . TETANUS/TDAP  08/25/2027  . INFLUENZA VACCINE  Completed  . PNEUMOCOCCAL POLYSACCHARIDE VACCINE AGE 35-64 HIGH RISK  Completed  . Hepatitis C Screening  Completed  . HIV Screening  Completed   Depression screen [facility] 2/9 09/13/2018 08/24/2017  Decreased Interest 0 0  Down, Depressed, Hopeless 0 0  PHQ - 2 Score 0 0  Altered sleeping 1 -  Tired, decreased energy 0 -  Change in appetite 2 -  Feeling bad or failure about yourself  0 -  Trouble concentrating 0 -  Moving slowly or fidgety/restless 0 -  Suicidal thoughts 0 -  PHQ-9 Score 3 -     Mammogram 09/23/17 S/p vulvectomy in 2016 for squamous cells-sees Dr. Lucianne Lei (gyn onc at [facility]).  Was last seen on 10/20/17. Has appointment with GYN/ONC in 10/2018.  CAD with cardiomyopathy- She is on Lisinopril, metoprolol,  ASA and Brilinta. Followed by cardiology, Dr. Rockey Situ Last saw him on 10/21/17.  Note reviewed.  Advised to decrease Brilinta to 60 mg twice daily.   Has an appointment to see him in 10/2018.   DM- Does not check FSBS regularly.  She has been compliant with Metformin 500 mg twice daily.  Lab Results  Component Value Date   HGBA1C 6.4 08/24/2017     Lab Results  Component Value Date   CHOL 128 08/24/2017   HDL 56.50 08/24/2017   LDLCALC 63 08/24/2017   TRIG 46.0 08/24/2017   CHOLHDL 2 08/24/2017    Lab Results  Component Value Date   TSH 1.81 08/24/2017   Lab Results  Component Value Date   WBC 4.7 08/24/2017   HGB 13.7  08/24/2017   HCT 40.4 08/24/2017   MCV 91.4 08/24/2017   PLT 227.0 08/24/2017    Current Outpatient Medications on File Prior to Visit  Medication Sig Dispense Refill  . acetaminophen (TYLENOL) 500 MG tablet Take 500 mg by mouth every 6 (six) hours as needed for mild pain.    Marland Kitchen aspirin 81 MG EC tablet TAKE 1 TABLET BY MOUTH EVERY DAY 90 tablet 1  . atorvastatin (LIPITOR) 20 MG tablet TAKE 1 TABLET BY MOUTH EVERY DAY 90 tablet 1  . calcium carbonate (TUMS - DOSED IN MG ELEMENTAL CALCIUM) 500 MG chewable tablet Chew 1 tablet by mouth daily as needed for indigestion or heartburn.    . docusate sodium (COLACE) 100 MG capsule Take 100 mg by mouth every other day.     . lisinopril (PRINIVIL,ZESTRIL) 5 MG tablet TAKE 1 TABLET BY MOUTH EVERY DAY 90 tablet 3  . metFORMIN (GLUCOPHAGE) 1000 MG tablet TAKE 1 TABLET (1,000 MG TOTAL) BY MOUTH 2 (TWO) TIMES DAILY WITH A MEAL. 180 tablet 1  . metoprolol succinate (TOPROL-XL) 25 MG 24 hr tablet Take 1 tablet (25 mg total) by mouth daily. 90 tablet 3  . nitroGLYCERIN (NITROSTAT) 0.4 MG SL tablet Place 1 tablet (0.4 mg total) under the tongue every  5 (five) minutes as needed for chest pain (up to 3 doses). 25 tablet 3  . ticagrelor (BRILINTA) 60 MG TABS tablet Take 1 tablet (60 mg total) by mouth 2 (two) times daily. 180 tablet 3   No current facility-administered medications on file prior to visit.     No Known Allergies  Past Medical History:  Diagnosis Date  . CAD in native artery    a. inf/lat STEMI 10/2016 with occluded RCA s/p DES, moderate nonobstructive LAD stenosis and mild nonobstructive LCx stenosis, EF 50-55% -> taken back to cath lab for acute stent thrombosis s/p thromectomy/PTCA of RCA same day. EF 30-35% by echo 11/02/16.   . Diabetes ([place])   . Hyperlipidemia, mixed 11/01/2016  . Ischemic cardiomyopathy   . Morbid obesity ([place])   . NSVT (nonsustained ventricular tachycardia) ([place])    a. during adm 10/2016 for STEMI.  Marland Kitchen STEMI involving  right coronary artery ([place]) 11/01/2016  . Tobacco abuse   . Torsades de pointes ([place])    a. during adm 10/2016 for STEMI, felt infarct related.  . Vulvar cancer [facility])     Past Surgical History:  Procedure Laterality Date  . CARDIAC CATHETERIZATION N/A 11/01/2016   Procedure: Left Heart Cath and Coronary Angiography;  Surgeon: Sherren Mocha, MD;  Location: [place] [facility];  Service: Cardiovascular;  Laterality: N/A;  . CARDIAC CATHETERIZATION N/A 11/01/2016   Procedure: Coronary Stent Intervention;  Surgeon: Sherren Mocha, MD;  Location: [place] [facility];  Service: Cardiovascular;  Laterality: N/A;  DES to Prox RCA- Promus 3.5x28  . CESAREAN SECTION    . CORONARY THROMBECTOMY N/A 11/01/2016   Procedure: Coronary Thrombectomy;  Surgeon: Sherren Mocha, MD;  Location: [place] CV LAB;  Service: Cardiovascular;  Laterality: N/A;  . INTRAVASCULAR ULTRASOUND/IVUS N/A 11/01/2016   Procedure: Intravascular Ultrasound/IVUS;  Surgeon: Sherren Mocha, MD;  Location: [place] [facility];  Service: Cardiovascular;  Laterality: N/A;  . LAMINECTOMY  1986  . LEFT HEART CATH AND CORONARY ANGIOGRAPHY N/A 11/01/2016   Procedure: Left Heart Cath and Coronary Angiography;  Surgeon: Sherren Mocha, MD;  Location: [place] [facility];  Service: Cardiovascular;  Laterality: N/A;  . TEMPORARY PACEMAKER N/A 11/01/2016   Procedure: Temporary Pacemaker;  Surgeon: Sherren Mocha, MD;  Location: [place] [facility];  Service: Cardiovascular;  Laterality: N/A;  . TONSILLECTOMY  1981  . WISDOM TOOTH EXTRACTION      Family History  Problem Relation Age of Onset  . Uterine cancer Mother   . Heart disease Mother   . Breast cancer Neg Hx     Social History   Socioeconomic History  . Marital status: Married    Spouse name: Not on file  . Number of children: Not on file  . Years of education: Not on file  . Highest education level: Not on file  Occupational History  . Not on file  Social Needs  .  Financial resource strain: Not on file  . Food insecurity:    Worry: Not on file    Inability: Not on file  . Transportation needs:    Medical: Not on file    Non-medical: Not on file  Tobacco Use  . Smoking status: Former Smoker    Packs/day: 0.50    Types: Cigarettes  . Smokeless tobacco: Never Used  Substance and Sexual Activity  . Alcohol use: No  . Drug use: No  . Sexual activity: Yes  Lifestyle  . Physical activity:    Days per week:  Not on file    Minutes per session: Not on file  . Stress: Not on file  Relationships  . Social connections:    Talks on phone: Not on file    Gets together: Not on file    Attends religious service: Not on file    Active member of club or organization: Not on file    Attends meetings of clubs or organizations: Not on file    Relationship status: Not on file  . Intimate partner violence:    Fear of current or ex partner: Not on file    Emotionally abused: Not on file    Physically abused: Not on file    Forced sexual activity: Not on file  Other Topics Concern  . Not on file  Social History Narrative   RN at [employer]   Married   The PMH, [place], Social History, Family History, Medications, and allergies have been reviewed in [facility], and have been updated if relevant.   Review of Systems  Constitutional: Negative.   HENT: Negative.   Eyes: Negative.   Respiratory: Negative.   Cardiovascular: Negative.   Gastrointestinal: Negative.   Endocrine: Negative.   Genitourinary: Negative.   Musculoskeletal: Negative.   Skin: Negative.   Allergic/Immunologic: Negative.   Neurological: Negative.   Hematological: Negative.   Psychiatric/Behavioral: Negative.   All other systems reviewed and are negative.      Objective:    BP 132/66   Pulse 66   Temp 98.2 F (36.8 C) (Oral)   Ht 5\' 5"  (1.651 m)   Wt 221 lb (100.2 kg)   SpO2 95%   BMI 36.78 kg/m  Wt Readings from Last 3 Encounters:  09/13/18 221 lb (100.2 kg)  10/21/17 221  lb (100.2 kg)  08/24/17 216 lb (98 kg)     Physical Exam   General:  Well-developed,well-nourished,in no acute distress; alert,appropriate and cooperative throughout examination Head:  normocephalic and atraumatic.   Eyes:  vision grossly intact, PERRL Ears:  R ear normal and L ear normal externally, TMs clear bilaterally Nose:  no external deformity.   Mouth:  good dentition.   Neck:  No deformities, masses, or tenderness noted. Breasts:  No mass, nodules, thickening, tenderness, bulging, retraction, inflamation, nipple discharge or skin changes noted.   Lungs:  Normal respiratory effort, chest expands symmetrically. Lungs are clear to auscultation, no crackles or wheezes. Heart:  Normal rate and regular rhythm. S1 and S2 normal without gallop, murmur, click, rub or other extra sounds. Abdomen:  Bowel sounds positive,abdomen soft and non-tender without masses, organomegaly or hernias noted. Msk:  No deformity or scoliosis noted of thoracic or lumbar spine.   Extremities:  No clubbing, cyanosis, edema, or deformity noted with normal full range of motion of all joints.   Neurologic:  alert & oriented X3 and gait normal.   Skin:  Intact without suspicious lesions or rashes Cervical Nodes:  No lymphadenopathy noted Axillary Nodes:  No palpable lymphadenopathy Psych:  Cognition and judgment appear intact. Alert and cooperative with normal attention span and concentration. No apparent delusions, illusions, hallucinations        Assessment & Plan:   Well woman exam without gynecological exam  Hyperlipidemia, mixed - Plan: Comprehensive metabolic panel, Lipid panel  Type 2 diabetes mellitus with other circulatory complication, without long-term current use of insulin (HCC) - Plan: CBC with Differential/Platelet, Comprehensive metabolic panel, Lipid panel, TSH, Hemoglobin A1c No follow-ups on file.

## 2018-09-13 NOTE — Patient Instructions (Signed)
Great to see you. I will call you with your lab results from today and you can view them online.   Please call [facility] [facility] to schedule your mammogram.

## 2018-09-13 NOTE — Assessment & Plan Note (Signed)
Continue current rxs. No changes made.

## 2018-09-13 NOTE — Assessment & Plan Note (Signed)
Continue current rxs. Check a1c and other labs today.

## 2018-09-13 NOTE — Assessment & Plan Note (Signed)
Reviewed preventive care protocols, scheduled due services, and updated immunizations Discussed nutrition, exercise, diet, and healthy lifestyle.  cologuard and mammogram ordered.

## 2018-09-14 LAB — LIPID PANEL
CHOLESTEROL: 135 mg/dL (ref 0–200)
HDL: 56.2 mg/dL (ref >39.00)
LDL Cholesterol: 59 mg/dL (ref 0–99)
NONHDL: 79.19
Total CHOL/HDL Ratio: 2
Triglycerides: 99 mg/dL (ref 0.0–149.0)
VLDL: 19.8 mg/dL (ref 0.0–40.0)

## 2018-09-14 LAB — CBC WITH DIFFERENTIAL/PLATELET
BASOS ABS: 0.1 10*3/uL (ref 0.0–0.1)
Basophils Relative: 1.2 % (ref 0.0–3.0)
EOS ABS: 0 10*3/uL (ref 0.0–0.7)
Eosinophils Relative: 0.5 % (ref 0.0–5.0)
HEMATOCRIT: 41.5 % (ref 36.0–46.0)
HEMOGLOBIN: 14 g/dL (ref 12.0–15.0)
LYMPHS PCT: 26.3 % (ref 12.0–46.0)
Lymphs Abs: 1.5 10*3/uL (ref 0.7–4.0)
MCHC: 33.7 g/dL (ref 30.0–36.0)
MCV: 90.1 fl (ref 78.0–100.0)
Monocytes Absolute: 0.5 10*3/uL (ref 0.1–1.0)
Monocytes Relative: 8.6 % (ref 3.0–12.0)
Neutro Abs: 3.6 10*3/uL (ref 1.4–7.7)
Neutrophils Relative %: 63.4 % (ref 43.0–77.0)
Platelets: 235 10*3/uL (ref 150.0–400.0)
RBC: 4.61 Mil/uL (ref 3.87–5.11)
RDW: 13.8 % (ref 11.5–15.5)
WBC: 5.7 10*3/uL (ref 4.0–10.5)

## 2018-09-14 LAB — HEMOGLOBIN A1C: HEMOGLOBIN A1C: 6.3 % (ref 4.6–6.5)

## 2018-09-14 LAB — COMPREHENSIVE METABOLIC PANEL
ALK PHOS: 56 U/L (ref 39–117)
ALT: 14 U/L (ref 0–35)
AST: 15 U/L (ref 0–37)
Albumin: 4.2 g/dL (ref 3.5–5.2)
BUN: 19 mg/dL (ref 6–23)
CALCIUM: 9.4 mg/dL (ref 8.4–10.5)
CO2: 26 meq/L (ref 19–32)
Chloride: 103 mEq/L (ref 96–112)
Creatinine, Ser: 0.69 mg/dL (ref 0.40–1.20)
GFR: 91.8 mL/min (ref >60.00)
Glucose, Bld: 127 mg/dL — ABNORMAL HIGH (ref 70–99)
Potassium: 4.3 mEq/L (ref 3.5–5.1)
Sodium: 137 mEq/L (ref 135–145)
Total Bilirubin: 0.6 mg/dL (ref 0.2–1.2)
Total Protein: 6.9 g/dL (ref 6.0–8.3)

## 2018-09-14 LAB — TSH: TSH: 2.57 u[IU]/mL (ref 0.35–4.50)

## 2018-09-28 ENCOUNTER — Encounter: Payer: Self-pay | Admitting: Family Medicine

## 2018-09-28 DIAGNOSIS — Z1211 Encounter for screening for malignant neoplasm of colon: Secondary | ICD-10-CM | POA: Diagnosis not present

## 2018-09-28 DIAGNOSIS — Z1212 Encounter for screening for malignant neoplasm of rectum: Secondary | ICD-10-CM | POA: Diagnosis not present

## 2018-10-02 LAB — COLOGUARD: Cologuard: NEGATIVE

## 2018-10-05 ENCOUNTER — Other Ambulatory Visit: Payer: Self-pay | Admitting: Family Medicine

## 2018-10-12 ENCOUNTER — Ambulatory Visit
Admission: RE | Admit: 2018-10-12 | Discharge: 2018-10-12 | Disposition: A | Discharge status: Home / Self Care | Payer: BLUE CROSS/BLUE SHIELD | Source: Ambulatory Visit | Attending: Family Medicine | Admitting: Family Medicine

## 2018-10-12 DIAGNOSIS — Z1239 Encounter for other screening for malignant neoplasm of breast: Secondary | ICD-10-CM | POA: Insufficient documentation

## 2018-10-12 DIAGNOSIS — Z1231 Encounter for screening mammogram for malignant neoplasm of breast: Secondary | ICD-10-CM | POA: Diagnosis not present

## 2018-10-24 NOTE — Progress Notes (Signed)
Cardiology Office Note  Date:  10/25/2018   ID:  Karen Hartman, Karen Hartman 1956-08-23, MRN [id number]  PCP:  Lucille Passy, MD   Chief Complaint  Patient presents with  . OTHER    12 month f/u no complaints today. Meds reviewed verbally with pt.    HPI:  Karen Hartman a 62 y.o.femalewith history of  morbid obesity,  DM (previously treated with weight loss),  tobacco abuse, stopped December 2017 vulvar cancer s/p prior surgery,  CAD with inf-lat STEMI 10/2016 s/p PCI to RCA  Ejection fraction 50 to 55% by echo January 2018 who presents for f/u of her coronary artery disease  Weight up, High carb diet No regular exercise program Works at [employer] Denies any anginal symptoms, no shortness of breath Denies any leg swelling  Lab work reviewed total cholesterol the 130 range LDL 59 Discussion concerning whether to stay on Brilinta 60 twice daily with aspirin No side effects  Stop smoking December 2017 Did smoke for 40 years  Discussed follow-up echocardiogram January 2018 showing ejection fraction 50 to 55%  EKG personally reviewed by myself on todays visit Shows normal sinus rhythm rate 69 bpm no significant ST or T wave changes  Other past medical history reviewed After her ST elevation and stent to the RCA, she had recurrent ST elevation concerning for acute stent thrombosis.   treated with Aggrestat and taken back to the cath lab urgently which showed acute stent thrombosis which was treated successfully with aspiration thrombectomy, angiojet thrombectomy, and PTCA reloaded with an additional 180mg  of Brilinta, continued on Aggrestat for 18 hours. felt this event possibly happened due to her delay in digesting Brilinta.   troponin peaked at 29.   2D echo 11/02/16 showed EF 30-35%, grade 1 DD, mild MR, moderately dilated RV, moderate RV dysfunction, akinesis of the basal and mid inferoseptal, inferior, inferolateral walls, and apical inferior and septal  walls.    runs of NSVT as well as Torsades (18 seconds) on telemetry.    treated with heparin, amiodarone and lidocaine.   LifeVest was recommended with repeat echo 30 days - if EF >35 and no shocks, could discontinue Lifevest at that time.    PMH:   has a past medical history of CAD in native artery, Diabetes ([place]), Hyperlipidemia, mixed (11/01/2016), Ischemic cardiomyopathy, Morbid obesity ([place]), NSVT (nonsustained ventricular tachycardia) ([place]), STEMI involving right coronary artery ([place]) (11/01/2016), Tobacco abuse, Torsades de pointes ([place]), and Vulvar cancer ([place]).  PSH:    Past Surgical History:  Procedure Laterality Date  . CARDIAC CATHETERIZATION N/A 11/01/2016   Procedure: Left Heart Cath and Coronary Angiography;  Surgeon: Sherren Mocha, MD;  Location: [place] [facility];  Service: Cardiovascular;  Laterality: N/A;  . CARDIAC CATHETERIZATION N/A 11/01/2016   Procedure: Coronary Stent Intervention;  Surgeon: Sherren Mocha, MD;  Location: [place] [facility];  Service: Cardiovascular;  Laterality: N/A;  DES to Prox RCA- Promus 3.5x28  . CESAREAN SECTION    . CORONARY THROMBECTOMY N/A 11/01/2016   Procedure: Coronary Thrombectomy;  Surgeon: Sherren Mocha, MD;  Location: [place] [facility];  Service: Cardiovascular;  Laterality: N/A;  . INTRAVASCULAR ULTRASOUND/IVUS N/A 11/01/2016   Procedure: Intravascular Ultrasound/IVUS;  Surgeon: Sherren Mocha, MD;  Location: [place] [facility];  Service: Cardiovascular;  Laterality: N/A;  . LAMINECTOMY  1986  . LEFT HEART CATH AND CORONARY ANGIOGRAPHY N/A 11/01/2016   Procedure: Left Heart Cath and Coronary Angiography;  Surgeon: Sherren Mocha, MD;  Location: [facility]  INVASIVE [facility];  Service: Cardiovascular;  Laterality: N/A;  . TEMPORARY PACEMAKER N/A 11/01/2016   Procedure: Temporary Pacemaker;  Surgeon: Sherren Mocha, MD;  Location: [place] [facility];  Service: Cardiovascular;  Laterality: N/A;  . TONSILLECTOMY  1981  . WISDOM TOOTH  EXTRACTION      Current Outpatient Medications  Medication Sig Dispense Refill  . acetaminophen (TYLENOL) 500 MG tablet Take 500 mg by mouth every 6 (six) hours as needed for mild pain.    Marland Kitchen aspirin 81 MG EC tablet TAKE 1 TABLET BY MOUTH EVERY DAY 90 tablet 1  . atorvastatin (LIPITOR) 20 MG tablet TAKE 1 TABLET BY MOUTH EVERY DAY 90 tablet 1  . calcium carbonate (TUMS - DOSED IN MG ELEMENTAL CALCIUM) 500 MG chewable tablet Chew 1 tablet by mouth daily as needed for indigestion or heartburn.    . docusate sodium (COLACE) 100 MG capsule Take 100 mg by mouth every other day.     . lisinopril (PRINIVIL,ZESTRIL) 5 MG tablet TAKE 1 TABLET BY MOUTH EVERY DAY 90 tablet 3  . metFORMIN (GLUCOPHAGE) 1000 MG tablet TAKE 1 TABLET (1,000 MG TOTAL) BY MOUTH 2 (TWO) TIMES DAILY WITH A MEAL. 180 tablet 1  . metoprolol succinate (TOPROL-XL) 25 MG 24 hr tablet Take 1 tablet (25 mg total) by mouth daily. 90 tablet 3  . nitroGLYCERIN (NITROSTAT) 0.4 MG SL tablet Place 1 tablet (0.4 mg total) under the tongue every 5 (five) minutes as needed for chest pain (up to 3 doses). 25 tablet 3  . ticagrelor (BRILINTA) 60 MG TABS tablet Take 1 tablet (60 mg total) by mouth 2 (two) times daily. 180 tablet 3   No current facility-administered medications for this visit.      Allergies:   Patient has no known allergies.   Social History:  The patient  reports that she has quit smoking. Her smoking use included cigarettes. She smoked 0.50 packs per day. She has never used smokeless tobacco. She reports that she does not drink alcohol or use drugs.   Family History:   family history includes Heart disease in her mother; Uterine cancer in her mother.    Review of Systems: Review of Systems  Constitutional: Negative.        Weight gain  Respiratory: Negative.   Cardiovascular: Negative.   Gastrointestinal: Negative.   Musculoskeletal: Negative.   Neurological: Negative.   Psychiatric/Behavioral: Negative.   All other  systems reviewed and are negative.    PHYSICAL EXAM: VS:  BP 114/60 (BP Location: Left Arm, Patient Position: Sitting, Cuff Size: Large)   Pulse 69   Ht 5\' 5"  (1.651 m)   Wt 226 lb (102.5 kg)   BMI 37.61 kg/m  , BMI Body mass index is 37.61 kg/m. Constitutional:  oriented to person, place, and time. No distress.  Obese HENT:  Head: Grossly normal Eyes:  no discharge. No scleral icterus.  Neck: No JVD, no carotid bruits  Cardiovascular: Regular rate and rhythm, no murmurs appreciated Pulmonary/Chest: Clear to auscultation bilaterally, no wheezes or rails Abdominal: Soft.  no distension.  no tenderness.  Musculoskeletal: Normal range of motion Neurological:  normal muscle tone. Coordination normal. No atrophy Skin: Skin warm and dry Psychiatric: normal affect, pleasant   Recent Labs: 09/13/2018: ALT 14; BUN 19; Creatinine, Ser 0.69; Hemoglobin 14.0; Platelets 235.0; Potassium 4.3; Sodium 137; TSH 2.57    Lipid Panel Lab Results  Component Value Date   CHOL 135 09/13/2018   HDL 56.20 09/13/2018  [place] 59 09/13/2018   TRIG 99.0 09/13/2018      Wt Readings from Last 3 Encounters:  10/25/18 226 lb (102.5 kg)  09/13/18 221 lb (100.2 kg)  10/21/17 221 lb (100.2 kg)     ASSESSMENT AND PLAN:  Cardiomyopathy, ischemic - Plan: EKG 12-Lead She had full recovery and ejection fraction most recently 50-55% in January 2018 Blood pressure well controlled, appears euvolemic  NSVT (nonsustained ventricular tachycardia) (HCC) Denies any palpitations concerning for nonsustained VT Occasionally when she gets startled she has flip-flop or palpitation  Hyperlipidemia, mixed Cholesterol is at goal on the current lipid regimen. No changes to the medications were made.  Stable  CAD in native artery After long discussion she will Continue low-dose aspirin and Brilinta  60 mg twice daily Coupon provided  Type 2 diabetes mellitus with other circulatory complication, without  long-term current use of insulin ([place]) Recommend she avoid carbohydrates, increase her walking for weight loss home She does like bread and high carbohydrates  Disposition:   F/U  12 months   Total encounter time more than 25 minutes  Greater than 50% was spent in counseling and coordination of care with the patient    Orders Placed This Encounter  Procedures  . EKG 12-Lead     Signed, Esmond Plants, M.D., Ph.D. 10/25/2018  [place], [place]

## 2018-10-25 ENCOUNTER — Encounter: Payer: Self-pay | Admitting: Cardiovascular Disease

## 2018-10-25 ENCOUNTER — Ambulatory Visit: Payer: BLUE CROSS/BLUE SHIELD | Admitting: Cardiovascular Disease

## 2018-10-25 VITALS — BP 114/60 | HR 69 | Ht 65.0 in | Wt 226.0 lb

## 2018-10-25 DIAGNOSIS — I25118 Atherosclerotic heart disease of native coronary artery with other forms of angina pectoris: Secondary | ICD-10-CM | POA: Diagnosis not present

## 2018-10-25 DIAGNOSIS — R002 Palpitations: Secondary | ICD-10-CM

## 2018-10-25 DIAGNOSIS — Z720 Tobacco use: Secondary | ICD-10-CM

## 2018-10-25 DIAGNOSIS — E1159 Type 2 diabetes mellitus with other circulatory complications: Secondary | ICD-10-CM

## 2018-10-25 DIAGNOSIS — E782 Mixed hyperlipidemia: Secondary | ICD-10-CM | POA: Diagnosis not present

## 2018-10-25 DIAGNOSIS — I472 Ventricular tachycardia: Secondary | ICD-10-CM

## 2018-10-25 DIAGNOSIS — I255 Ischemic cardiomyopathy: Secondary | ICD-10-CM | POA: Diagnosis not present

## 2018-10-25 DIAGNOSIS — I4729 Other ventricular tachycardia: Secondary | ICD-10-CM

## 2018-10-25 NOTE — Patient Instructions (Signed)

## 2018-10-26 ENCOUNTER — Ambulatory Visit: Payer: BLUE CROSS/BLUE SHIELD | Admitting: Cardiovascular Disease

## 2018-11-09 DIAGNOSIS — C519 Malignant neoplasm of vulva, unspecified: Secondary | ICD-10-CM | POA: Diagnosis not present

## 2018-11-09 DIAGNOSIS — E119 Type 2 diabetes mellitus without complications: Secondary | ICD-10-CM | POA: Diagnosis not present

## 2018-11-10 ENCOUNTER — Other Ambulatory Visit: Payer: Self-pay | Admitting: Family Medicine

## 2018-11-10 ENCOUNTER — Other Ambulatory Visit: Payer: Self-pay | Admitting: Cardiovascular Disease

## 2018-11-17 ENCOUNTER — Other Ambulatory Visit: Payer: Self-pay | Admitting: Cardiovascular Disease

## 2019-03-28 ENCOUNTER — Other Ambulatory Visit: Payer: Self-pay

## 2019-03-28 MED ORDER — TICAGRELOR 60 MG PO TABS: 60.0000 mg | ORAL_TABLET | Freq: Two times a day (BID) | ORAL | 3 refills | Status: DC

## 2019-03-29 ENCOUNTER — Other Ambulatory Visit: Payer: Self-pay | Admitting: Family Medicine

## 2019-03-29 MED ORDER — ATORVASTATIN CALCIUM 20 MG PO TABS: 20.0000 mg | ORAL_TABLET | Freq: Every day | ORAL | 1 refills | Status: DC

## 2019-03-29 NOTE — Telephone Encounter (Signed)
Copied from [address] [id number]. Topic: Quick Communication - Rx Refill/Question >> Mar 29, 2019 12:04 PM Margot Ables wrote: Medication: atorvastatin (LIPITOR) 20 MG tablet - pt states pharmacy has been requesting without response - pt has about 1 wk left - requesting 90 day supply  Has the patient contacted their pharmacy? yes Preferred Pharmacy (with phone number or street name): [id number] [address], [place] [phone] (Phone) [phone] (Fax)

## 2019-03-29 NOTE — Telephone Encounter (Signed)
Rx sent in

## 2019-05-04 ENCOUNTER — Telehealth: Payer: Self-pay | Admitting: Family Medicine

## 2019-05-04 NOTE — Telephone Encounter (Signed)
Medication Refill - Medication:  metFORMIN (GLUCOPHAGE) 1000 MG tablet    metoprolol succinate (TOPROL-XL) 25 MG 24 hr tablet     Has the patient contacted their pharmacy? Yes - states the pharmacy isn't getting any response from the office (Agent: If no, request that the patient contact the pharmacy for the refill.) (Agent: If yes, when and what did the pharmacy advise?)  Preferred Pharmacy (with phone number or street name):  [id number] [address], [place] [phone] (Phone) [phone] (Fax)     Agent: Please be advised that RX refills may take up to 3 business days. We ask that you follow-up with your pharmacy.

## 2019-05-05 ENCOUNTER — Other Ambulatory Visit: Payer: Self-pay

## 2019-05-05 MED ORDER — METOPROLOL SUCCINATE ER 25 MG PO TB24: 25.0000 mg | ORAL_TABLET | Freq: Every day | ORAL | 0 refills | Status: DC

## 2019-05-05 MED ORDER — METFORMIN HCL 1000 MG PO TABS: 1000.0000 mg | ORAL_TABLET | Freq: Two times a day (BID) | ORAL | 0 refills | Status: DC

## 2019-05-05 NOTE — Telephone Encounter (Signed)
Completed/thx dmf 

## 2019-07-30 ENCOUNTER — Other Ambulatory Visit: Payer: Self-pay | Admitting: Family Medicine

## 2019-08-31 DIAGNOSIS — Z85828 Personal history of other malignant neoplasm of skin: Secondary | ICD-10-CM | POA: Diagnosis not present

## 2019-08-31 DIAGNOSIS — L900 Lichen sclerosus et atrophicus: Secondary | ICD-10-CM | POA: Diagnosis not present

## 2019-08-31 DIAGNOSIS — Z08 Encounter for follow-up examination after completed treatment for malignant neoplasm: Secondary | ICD-10-CM | POA: Diagnosis not present

## 2019-08-31 DIAGNOSIS — L570 Actinic keratosis: Secondary | ICD-10-CM | POA: Diagnosis not present

## 2019-08-31 DIAGNOSIS — D485 Neoplasm of uncertain behavior of skin: Secondary | ICD-10-CM | POA: Diagnosis not present

## 2019-09-01 LAB — HM DIABETES EYE EXAM

## 2019-09-08 ENCOUNTER — Other Ambulatory Visit: Payer: Self-pay | Admitting: Family Medicine

## 2019-09-09 NOTE — Telephone Encounter (Signed)
Last fill 05/05/19  #90/0 Last OV 09/13/2018 Next OV 09/19/2019

## 2019-09-15 ENCOUNTER — Other Ambulatory Visit: Payer: Self-pay | Admitting: Family Medicine

## 2019-09-15 DIAGNOSIS — Z1231 Encounter for screening mammogram for malignant neoplasm of breast: Secondary | ICD-10-CM

## 2019-09-16 ENCOUNTER — Other Ambulatory Visit: Payer: Self-pay

## 2019-09-18 NOTE — Progress Notes (Signed)
Subjective:   Patient ID: Karen Hartman, female    DOB: 1956/03/19, 63 y.o.   MRN: [id number]  Karen Hartman is a pleasant 63 y.o. year old female who presents to clinic today with Annual Exam (Pt is here today for a CPE with PAP. She sees Dr. Lucianne Lei for GYN in [place].  Her Mammogram is scheduled for 12.8.20. Cologuard is UTD next cue on 11.20.2022.)  on 09/19/2019  HPI:  Health Maintenance  Topic Date Due  . HEMOGLOBIN A1C  03/14/2019  . FOOT EXAM  09/14/2019  . PAP SMEAR-Modifier  08/24/2020  . OPHTHALMOLOGY EXAM  08/31/2020  . MAMMOGRAM  10/12/2020  . Fecal DNA (Cologuard)  09/29/2021  . TETANUS/TDAP  08/25/2027  . INFLUENZA VACCINE  Completed  . PNEUMOCOCCAL POLYSACCHARIDE VACCINE AGE 38-64 HIGH RISK  Completed  . Hepatitis C Screening  Completed  . HIV Screening  Completed   Depression screen [facility] 2/9 09/19/2019 09/13/2018 08/24/2017  Decreased Interest 0 0 0  Down, Depressed, Hopeless 0 0 0  PHQ - 2 Score 0 0 0  Altered sleeping 2 1 -  Tired, decreased energy 0 0 -  Change in appetite 0 2 -  Feeling bad or failure about yourself  0 0 -  Trouble concentrating 0 0 -  Moving slowly or fidgety/restless 0 0 -  Suicidal thoughts 0 0 -  PHQ-9 Score 2 3 -  Difficult doing work/chores Not difficult at all - -   Has lost 20 pounds by cutting out carbs and feels much better.  Negative cologuard on 09/28/18. Mammogram scheduled for 10/18/19. S/p vulvectomy in 2016 for squamous cells-sees Dr. Lucianne Lei (gyn onc at [facility]).  Was last seen on 10/20/17. Had appointment with GYN/ONC in 10/2018.  CAD with cardiomyopathy- She is on Lisinopril, metoprolol,  ASA and Brilinta. Followed by cardiology, Dr. Rockey Situ Last saw him on 10/25/18.  Note reviewed.  Advised to decrease Brilinta to 60 mg twice daily.  No changes made to medications.   DM- Does not check FSBS regularly.  She has been compliant with Metformin 1000 mg twice daily.  Since the weight weight loss, she has not had hypoglycemia.  Lab  Results  Component Value Date   HGBA1C 6.3 09/13/2018   HLD- on Liptor 20 mg daily.  Lab Results  Component Value Date   CHOL 135 09/13/2018   HDL 56.20 09/13/2018   LDLCALC 59 09/13/2018   TRIG 99.0 09/13/2018   CHOLHDL 2 09/13/2018    Lab Results  Component Value Date   TSH 2.57 09/13/2018   Lab Results  Component Value Date   WBC 5.7 09/13/2018   HGB 14.0 09/13/2018   HCT 41.5 09/13/2018   MCV 90.1 09/13/2018   PLT 235.0 09/13/2018    Current Outpatient Medications on File Prior to Visit  Medication Sig Dispense Refill  . acetaminophen (TYLENOL) 500 MG tablet Take 500 mg by mouth every 6 (six) hours as needed for mild pain.    . ASPIRIN LOW DOSE 81 MG EC tablet TAKE 1 TABLET BY MOUTH EVERY DAY 90 tablet 3  . atorvastatin (LIPITOR) 20 MG tablet Take 1 tablet (20 mg total) by mouth daily. 90 tablet 1  . calcium carbonate (TUMS - DOSED IN MG ELEMENTAL CALCIUM) 500 MG chewable tablet Chew 1 tablet by mouth daily as needed for indigestion or heartburn.    . docusate sodium (COLACE) 100 MG capsule Take 100 mg by mouth every other day.     . lisinopril (  PRINIVIL,ZESTRIL) 5 MG tablet TAKE 1 TABLET BY MOUTH EVERY DAY 90 tablet 3  . metFORMIN (GLUCOPHAGE) 1000 MG tablet TAKE 1 TABLET (1,000 MG TOTAL) BY MOUTH 2 (TWO) TIMES DAILY WITH A MEAL. 180 tablet 0  . metoprolol succinate (TOPROL-XL) 25 MG 24 hr tablet TAKE 1 TABLET BY MOUTH EVERY DAY 90 tablet 0  . nitroGLYCERIN (NITROSTAT) 0.4 MG SL tablet Place 1 tablet (0.4 mg total) under the tongue every 5 (five) minutes as needed for chest pain (up to 3 doses). 25 tablet 3  . ticagrelor (BRILINTA) 60 MG TABS tablet Take 1 tablet (60 mg total) by mouth 2 (two) times daily. 180 tablet 3   No current facility-administered medications on file prior to visit.     No Known Allergies  Past Medical History:  Diagnosis Date  . CAD in native artery    a. inf/lat STEMI 10/2016 with occluded RCA s/p DES, moderate nonobstructive LAD stenosis  and mild nonobstructive LCx stenosis, EF 50-55% -> taken back to cath lab for acute stent thrombosis s/p thromectomy/PTCA of RCA same day. EF 30-35% by echo 11/02/16.   . Diabetes ([place])   . Hyperlipidemia, mixed 11/01/2016  . Ischemic cardiomyopathy   . Morbid obesity ([place])   . NSVT (nonsustained ventricular tachycardia) ([place])    a. during adm 10/2016 for STEMI.  Marland Kitchen STEMI involving right coronary artery ([place]) 11/01/2016  . Tobacco abuse   . Torsades de pointes ([place])    a. during adm 10/2016 for STEMI, felt infarct related.  . Vulvar cancer [facility])     Past Surgical History:  Procedure Laterality Date  . CARDIAC CATHETERIZATION N/A 11/01/2016   Procedure: Left Heart Cath and Coronary Angiography;  Surgeon: Sherren Mocha, MD;  Location: [place] CV LAB;  Service: Cardiovascular;  Laterality: N/A;  . CARDIAC CATHETERIZATION N/A 11/01/2016   Procedure: Coronary Stent Intervention;  Surgeon: Sherren Mocha, MD;  Location: [place] CV LAB;  Service: Cardiovascular;  Laterality: N/A;  DES to Prox RCA- Promus 3.5x28  . CESAREAN SECTION    . CORONARY THROMBECTOMY N/A 11/01/2016   Procedure: Coronary Thrombectomy;  Surgeon: Sherren Mocha, MD;  Location: [place] CV LAB;  Service: Cardiovascular;  Laterality: N/A;  . INTRAVASCULAR ULTRASOUND/IVUS N/A 11/01/2016   Procedure: Intravascular Ultrasound/IVUS;  Surgeon: Sherren Mocha, MD;  Location: [place] CV LAB;  Service: Cardiovascular;  Laterality: N/A;  . LAMINECTOMY  1986  . LEFT HEART CATH AND CORONARY ANGIOGRAPHY N/A 11/01/2016   Procedure: Left Heart Cath and Coronary Angiography;  Surgeon: Sherren Mocha, MD;  Location: [place] CV LAB;  Service: Cardiovascular;  Laterality: N/A;  . TEMPORARY PACEMAKER N/A 11/01/2016   Procedure: Temporary Pacemaker;  Surgeon: Sherren Mocha, MD;  Location: [place] CV LAB;  Service: Cardiovascular;  Laterality: N/A;  . TONSILLECTOMY  1981  . WISDOM TOOTH EXTRACTION      Family History   Problem Relation Age of Onset  . Uterine cancer Mother   . Heart disease Mother   . Breast cancer Neg Hx     Social History   Socioeconomic History  . Marital status: Married    Spouse name: Not on file  . Number of children: Not on file  . Years of education: Not on file  . Highest education level: Not on file  Occupational History  . Not on file  Social Needs  . Financial resource strain: Not on file  . Food insecurity    Worry: Not on file    Inability: Not  on file  . Transportation needs    Medical: Not on file    Non-medical: Not on file  Tobacco Use  . Smoking status: Former Smoker    Packs/day: 0.50    Types: Cigarettes  . Smokeless tobacco: Never Used  Substance and Sexual Activity  . Alcohol use: No  . Drug use: No  . Sexual activity: Yes  Lifestyle  . Physical activity    Days per week: Not on file    Minutes per session: Not on file  . Stress: Not on file  Relationships  . Social Herbalist on phone: Not on file    Gets together: Not on file    Attends religious service: Not on file    Active member of club or organization: Not on file    Attends meetings of clubs or organizations: Not on file    Relationship status: Not on file  . Intimate partner violence    Fear of current or ex partner: Not on file    Emotionally abused: Not on file    Physically abused: Not on file    Forced sexual activity: Not on file  Other Topics Concern  . Not on file  Social History Narrative   RN at [employer]   Married   The PMH, [place], Social History, Family History, Medications, and allergies have been reviewed in [facility], and have been updated if relevant.   Review of Systems  Constitutional: Negative.   HENT: Negative.   Eyes: Negative.   Respiratory: Negative.   Cardiovascular: Negative.   Gastrointestinal: Negative.   Endocrine: Negative.   Genitourinary: Negative.   Musculoskeletal: Negative.   Skin: Negative.   Allergic/Immunologic: Negative.    Neurological: Negative.   Hematological: Negative.   Psychiatric/Behavioral: Negative.   All other systems reviewed and are negative.      Objective:    BP (!) 81/60 (BP Location: Left Arm, Patient Position: Sitting, Cuff Size: Normal)   Pulse 71   Temp (!) 97.4 F (36.3 C) (Oral)   Ht 5' 4.25" (1.632 m)   Wt 203 lb (92.1 kg)   SpO2 98%   BMI 34.57 kg/m    BP Readings from Last 3 Encounters:  09/19/19 (!) 81/60  10/25/18 114/60  09/13/18 132/66    Wt Readings from Last 3 Encounters:  09/19/19 203 lb (92.1 kg)  10/25/18 226 lb (102.5 kg)  09/13/18 221 lb (100.2 kg)     Physical Exam   General:  Well-developed,well-nourished,in no acute distress; alert,appropriate and cooperative throughout examination Head:  normocephalic and atraumatic.   Eyes:  vision grossly intact, PERRL Ears:  R ear normal and L ear normal externally, TMs clear bilaterally Nose:  no external deformity.   Mouth:  good dentition.   Neck:  No deformities, masses, or tenderness noted. Breasts:  No mass, nodules, thickening, tenderness, bulging, retraction, inflamation, nipple discharge or skin changes noted.   Lungs:  Normal respiratory effort, chest expands symmetrically. Lungs are clear to auscultation, no crackles or wheezes. Heart:  Normal rate and regular rhythm. S1 and S2 normal without gallop, murmur, click, rub or other extra sounds. Abdomen:  Bowel sounds positive,abdomen soft and non-tender without masses, organomegaly or hernias noted. Rectal:  no external abnormalities.   Genitalia:  Pelvic Exam:        External: normal female genitalia without lesions or masses        Vagina: normal without lesions or masses  Cervix: normal without lesions or masses        Adnexa: normal bimanual exam without masses or fullness        Uterus: normal by palpation        Pap smear: performed Msk:  No deformity or scoliosis noted of thoracic or lumbar spine.   Extremities:  No clubbing,  cyanosis, edema, or deformity noted with normal full range of motion of all joints.   Neurologic:  alert & oriented X3 and gait normal.   Skin:  Intact without suspicious lesions or rashes Cervical Nodes:  No lymphadenopathy noted Axillary Nodes:  No palpable lymphadenopathy Psych:  Cognition and judgment appear intact. Alert and cooperative with normal attention span and concentration. No apparent delusions, illusions, hallucinations        Assessment & Plan:   Well woman exam with routine gynecological exam - Plan: Cytology - PAP( [place])  Vulvar cancer ([place])  Hyperlipidemia, mixed - Plan: Well woman- non DM- CBC, Well woman- non DM- CMET, Well woman- non DM- lipid, well woman- non DM- TSH  Coronary artery disease involving native coronary artery of native heart without angina pectoris  Type 2 diabetes mellitus with other circulatory complication, without long-term current use of insulin ([place]) - Plan: DM- a1c  Cardiomyopathy, ischemic  NSVT (nonsustained ventricular tachycardia) ([place]), Chronic  Morbid obesity ([place]), Chronic  Atherosclerosis of native coronary artery of native heart with stable angina pectoris ([place]), Chronic No follow-ups on file.

## 2019-09-18 NOTE — Assessment & Plan Note (Signed)
On lipitor 20 mg daily. Due or labs.  Orders Placed This Encounter  Procedures  . Well woman- non DM- CBC  . Well woman- non DM- CMET  . Well woman- non DM- lipid  . well woman- non DM- TSH  . DM- a1c

## 2019-09-18 NOTE — Assessment & Plan Note (Addendum)
Reviewed preventive care protocols, scheduled due services, and updated immunizations Discussed nutrition, exercise, diet, and healthy lifestyle.  Pap smear done today.  Mammogram already scheduled for 10/18/19.

## 2019-09-18 NOTE — Assessment & Plan Note (Addendum)
a1c today. On statin and ACEI. Foot exam today. Decrease metformin to 500 mg twice daily due to weight loss.

## 2019-09-19 ENCOUNTER — Other Ambulatory Visit (HOSPITAL_COMMUNITY)
Admission: RE | Admit: 2019-09-19 | Discharge: 2019-09-19 | Disposition: A | Discharge status: Home / Self Care | Payer: BC Managed Care – PPO | Source: Ambulatory Visit | Attending: Family Medicine | Admitting: Family Medicine

## 2019-09-19 ENCOUNTER — Encounter: Payer: Self-pay | Admitting: Family Medicine

## 2019-09-19 ENCOUNTER — Ambulatory Visit (INDEPENDENT_AMBULATORY_CARE_PROVIDER_SITE_OTHER): Payer: BC Managed Care – PPO | Admitting: Family Medicine

## 2019-09-19 ENCOUNTER — Other Ambulatory Visit: Payer: Self-pay

## 2019-09-19 VITALS — BP 122/74 | HR 71 | Temp 97.4°F | Ht 64.25 in | Wt 203.0 lb

## 2019-09-19 DIAGNOSIS — Z01419 Encounter for gynecological examination (general) (routine) without abnormal findings: Secondary | ICD-10-CM | POA: Diagnosis not present

## 2019-09-19 DIAGNOSIS — Z0000 Encounter for general adult medical examination without abnormal findings: Secondary | ICD-10-CM

## 2019-09-19 DIAGNOSIS — C519 Malignant neoplasm of vulva, unspecified: Secondary | ICD-10-CM | POA: Diagnosis not present

## 2019-09-19 DIAGNOSIS — I25118 Atherosclerotic heart disease of native coronary artery with other forms of angina pectoris: Secondary | ICD-10-CM

## 2019-09-19 DIAGNOSIS — E1159 Type 2 diabetes mellitus with other circulatory complications: Secondary | ICD-10-CM

## 2019-09-19 DIAGNOSIS — E782 Mixed hyperlipidemia: Secondary | ICD-10-CM | POA: Diagnosis not present

## 2019-09-19 DIAGNOSIS — I255 Ischemic cardiomyopathy: Secondary | ICD-10-CM

## 2019-09-19 DIAGNOSIS — I472 Ventricular tachycardia: Secondary | ICD-10-CM

## 2019-09-19 DIAGNOSIS — I2510 Atherosclerotic heart disease of native coronary artery without angina pectoris: Secondary | ICD-10-CM

## 2019-09-19 DIAGNOSIS — I4729 Other ventricular tachycardia: Secondary | ICD-10-CM

## 2019-09-19 MED ORDER — METFORMIN HCL 500 MG PO TABS: 500.0000 mg | ORAL_TABLET | Freq: Two times a day (BID) | ORAL | 3 refills | Status: DC

## 2019-09-19 NOTE — Patient Instructions (Addendum)
Great to see you! I will call you with your lab results from today and you can view them online.   We are decreasing your Metformin to 500 mg twice daily.

## 2019-09-19 NOTE — Assessment & Plan Note (Signed)
Followed by Dr. Rockey Situ.  No changes made to rxs.

## 2019-09-19 NOTE — Assessment & Plan Note (Signed)
Followed by gyn/onc °

## 2019-09-20 LAB — CBC WITH DIFFERENTIAL/PLATELET
Basophils Absolute: 0 10*3/uL (ref 0.0–0.1)
Basophils Relative: 0.8 % (ref 0.0–3.0)
Eosinophils Absolute: 0 10*3/uL (ref 0.0–0.7)
Eosinophils Relative: 0.8 % (ref 0.0–5.0)
HCT: 40.9 % (ref 36.0–46.0)
Hemoglobin: 13.6 g/dL (ref 12.0–15.0)
Lymphocytes Relative: 28.7 % (ref 12.0–46.0)
Lymphs Abs: 1.6 10*3/uL (ref 0.7–4.0)
MCHC: 33.2 g/dL (ref 30.0–36.0)
MCV: 90.6 fl (ref 78.0–100.0)
Monocytes Absolute: 0.5 10*3/uL (ref 0.1–1.0)
Monocytes Relative: 9.6 % (ref 3.0–12.0)
Neutro Abs: 3.3 10*3/uL (ref 1.4–7.7)
Neutrophils Relative %: 60.1 % (ref 43.0–77.0)
Platelets: 209 10*3/uL (ref 150.0–400.0)
RBC: 4.52 Mil/uL (ref 3.87–5.11)
RDW: 14.6 % (ref 11.5–15.5)
WBC: 5.4 10*3/uL (ref 4.0–10.5)

## 2019-09-20 LAB — LIPID PANEL
Cholesterol: 131 mg/dL (ref 0–200)
HDL: 53.7 mg/dL (ref >39.00)
LDL Cholesterol: 63 mg/dL (ref 0–99)
NonHDL: 77.46
Total CHOL/HDL Ratio: 2
Triglycerides: 70 mg/dL (ref 0.0–149.0)
VLDL: 14 mg/dL (ref 0.0–40.0)

## 2019-09-20 LAB — COMPREHENSIVE METABOLIC PANEL
ALT: 20 U/L (ref 0–35)
AST: 23 U/L (ref 0–37)
Albumin: 4.1 g/dL (ref 3.5–5.2)
Alkaline Phosphatase: 56 U/L (ref 39–117)
BUN: 17 mg/dL (ref 6–23)
CO2: 25 mEq/L (ref 19–32)
Calcium: 9.2 mg/dL (ref 8.4–10.5)
Chloride: 105 mEq/L (ref 96–112)
Creatinine, Ser: 0.65 mg/dL (ref 0.40–1.20)
GFR: 92.23 mL/min (ref >60.00)
Glucose, Bld: 91 mg/dL (ref 70–99)
Potassium: 4.2 mEq/L (ref 3.5–5.1)
Sodium: 136 mEq/L (ref 135–145)
Total Bilirubin: 0.6 mg/dL (ref 0.2–1.2)
Total Protein: 6.5 g/dL (ref 6.0–8.3)

## 2019-09-20 LAB — HEMOGLOBIN A1C: Hgb A1c MFr Bld: 5.4 % (ref 4.6–6.5)

## 2019-09-20 LAB — TSH: TSH: 2.22 u[IU]/mL (ref 0.35–4.50)

## 2019-09-21 LAB — CYTOLOGY - PAP
Comment: NEGATIVE
Diagnosis: NEGATIVE
High risk HPV: NEGATIVE

## 2019-10-18 ENCOUNTER — Ambulatory Visit
Admission: RE | Admit: 2019-10-18 | Discharge: 2019-10-18 | Disposition: A | Discharge status: Home / Self Care | Payer: BC Managed Care – PPO | Source: Ambulatory Visit | Attending: Family Medicine | Admitting: Family Medicine

## 2019-10-18 DIAGNOSIS — Z1231 Encounter for screening mammogram for malignant neoplasm of breast: Secondary | ICD-10-CM | POA: Insufficient documentation

## 2019-10-30 NOTE — Progress Notes (Signed)
Cardiology Office Note  Date:  10/31/2019   ID:  Karen Hartman, Karen Hartman [birth year], MRN [id number]  PCP:  Lucille Passy, MD   Chief Complaint  Patient presents with  . Follow-up    12 month follow up. Meds reviewed by the pt. verbally. "doing well."     HPI:  Karen Hartman a 63 y.o.femalewith history of  morbid obesity,  DM (previously treated with weight loss),  tobacco abuse, stopped December 2017 vulvar cancer s/p prior surgery,  CAD with inf-lat STEMI 10/2016 s/p PCI to RCA  Ejection fraction 50 to 55% by echo January 2018 who presents for f/u of her coronary artery disease  Works at [employer], Recent covid infections  Gained weight in summer,  Lost weight, recently  Labs reviewed with her today HBA1c 5.4 Total cholesterol at goal, LDL close to 60  Weight up, High carb diet No regular exercise program  Denies any anginal symptoms, no shortness of breath chest pain on exertion Chronic stable swelling but no pitting edema  Discussed whether to stay on Brilinta She is on low-dose aspirin with Brilinta currently  Stop smoking December 2017 Did smoke for 40 years  Discussed follow-up echocardiogram January 2018 showing ejection fraction 50 to 55%  EKG personally reviewed by myself on todays visit Shows normal sinus rhythm rate 64 bpm no significant ST or T wave changes  Other past medical history reviewed After her ST elevation and stent to the RCA, she had recurrent ST elevation concerning for acute stent thrombosis.   treated with Aggrestat and taken back to the cath lab urgently which showed acute stent thrombosis which was treated successfully with aspiration thrombectomy, angiojet thrombectomy, and PTCA reloaded with an additional 180mg  of Brilinta, continued on Aggrestat for 18 hours. felt this event possibly happened due to her delay in digesting Brilinta.   troponin peaked at 29.   2D echo 11/02/16 showed EF 30-35%, grade 1 DD, mild MR,  moderately dilated RV, moderate RV dysfunction, akinesis of the basal and mid inferoseptal, inferior, inferolateral walls, and apical inferior and septal walls.    runs of NSVT as well as Torsades (18 seconds) on telemetry.    treated with heparin, amiodarone and lidocaine.   LifeVest was recommended with repeat echo 30 days - if EF >35 and no shocks, could discontinue Lifevest at that time.    PMH:   has a past medical history of CAD in native artery, Diabetes ([place]), Hyperlipidemia, mixed (11/01/2016), Ischemic cardiomyopathy, Morbid obesity ([place]), NSVT (nonsustained ventricular tachycardia) ([place]), STEMI involving right coronary artery ([place]) (11/01/2016), Tobacco abuse, Torsades de pointes ([place]), and Vulvar cancer ([place]).  PSH:    Past Surgical History:  Procedure Laterality Date  . CARDIAC CATHETERIZATION N/A 11/01/2016   Procedure: Left Heart Cath and Coronary Angiography;  Surgeon: Sherren Mocha, MD;  Location: [place] [facility];  Service: Cardiovascular;  Laterality: N/A;  . CARDIAC CATHETERIZATION N/A 11/01/2016   Procedure: Coronary Stent Intervention;  Surgeon: Sherren Mocha, MD;  Location: [place] [facility] LAB;  Service: Cardiovascular;  Laterality: N/A;  DES to Prox RCA- Promus 3.5x28  . CESAREAN SECTION    . CORONARY THROMBECTOMY N/A 11/01/2016   Procedure: Coronary Thrombectomy;  Surgeon: Sherren Mocha, MD;  Location: [place] [facility];  Service: Cardiovascular;  Laterality: N/A;  . INTRAVASCULAR ULTRASOUND/IVUS N/A 11/01/2016   Procedure: Intravascular Ultrasound/IVUS;  Surgeon: Sherren Mocha, MD;  Location: [place] [facility];  Service: Cardiovascular;  Laterality: N/A;  . LAMINECTOMY  1986  . LEFT HEART CATH AND CORONARY ANGIOGRAPHY N/A 11/01/2016   Procedure: Left Heart Cath and Coronary Angiography;  Surgeon: Sherren Mocha, MD;  Location: [place] CV LAB;  Service: Cardiovascular;  Laterality: N/A;  . TEMPORARY PACEMAKER N/A 11/01/2016   Procedure: Temporary  Pacemaker;  Surgeon: Sherren Mocha, MD;  Location: [place] [facility];  Service: Cardiovascular;  Laterality: N/A;  . TONSILLECTOMY  1981  . WISDOM TOOTH EXTRACTION      Current Outpatient Medications  Medication Sig Dispense Refill  . acetaminophen (TYLENOL) 500 MG tablet Take 500 mg by mouth every 6 (six) hours as needed for mild pain.    . ASPIRIN LOW DOSE 81 MG EC tablet TAKE 1 TABLET BY MOUTH EVERY DAY 90 tablet 3  . atorvastatin (LIPITOR) 20 MG tablet Take 1 tablet (20 mg total) by mouth daily. 90 tablet 1  . calcium carbonate (TUMS - DOSED IN MG ELEMENTAL CALCIUM) 500 MG chewable tablet Chew 1 tablet by mouth daily as needed for indigestion or heartburn.    . clobetasol ointment (TEMOVATE) [id number] % Apply 1 application topically 2 (two) times daily.     Marland Kitchen docusate sodium (COLACE) 100 MG capsule Take 100 mg by mouth every other day.     . lisinopril (PRINIVIL,ZESTRIL) 5 MG tablet TAKE 1 TABLET BY MOUTH EVERY DAY 90 tablet 3  . metFORMIN (GLUCOPHAGE) 500 MG tablet Take 1 tablet (500 mg total) by mouth 2 (two) times daily with a meal. 60 tablet 3  . metoprolol succinate (TOPROL-XL) 25 MG 24 hr tablet TAKE 1 TABLET BY MOUTH EVERY DAY 90 tablet 0  . nitroGLYCERIN (NITROSTAT) 0.4 MG SL tablet Place 1 tablet (0.4 mg total) under the tongue every 5 (five) minutes as needed for chest pain (up to 3 doses). 25 tablet 3  . ticagrelor (BRILINTA) 60 MG TABS tablet Take 1 tablet (60 mg total) by mouth 2 (two) times daily. 180 tablet 3   No current facility-administered medications for this visit.     Allergies:   Patient has no known allergies.   Social History:  The patient  reports that she has quit smoking. Her smoking use included cigarettes. She smoked 0.50 packs per day. She has never used smokeless tobacco. She reports that she does not drink alcohol or use drugs.   Family History:   family history includes Heart disease in her mother; Uterine cancer in her mother.    Review of  Systems: Review of Systems  Constitutional: Positive for weight loss.  Respiratory: Negative.   Cardiovascular: Negative.   Gastrointestinal: Negative.   Musculoskeletal: Negative.   Neurological: Negative.   Psychiatric/Behavioral: Negative.   All other systems reviewed and are negative.   PHYSICAL EXAM: VS:  BP 117/77 (BP Location: Left Arm, Patient Position: Sitting, Cuff Size: Normal)   Pulse 64   Ht 5\' 4"  (1.626 m)   Wt 199 lb (90.3 kg)   BMI 34.16 kg/m  , BMI Body mass index is 34.16 kg/m. Constitutional:  oriented to person, place, and time. No distress.  HENT:  Head: Grossly normal Eyes:  no discharge. No scleral icterus.  Neck: No JVD, no carotid bruits  Cardiovascular: Regular rate and rhythm, no murmurs appreciated Pulmonary/Chest: Clear to auscultation bilaterally, no wheezes or rails Abdominal: Soft.  no distension.  no tenderness.  Musculoskeletal: Normal range of motion Neurological:  normal muscle tone. Coordination normal. No atrophy Skin: Skin warm and dry Psychiatric: normal affect, pleasant   Recent Labs: 09/19/2019: ALT  20; BUN 17; Creatinine, Ser 0.65; Hemoglobin 13.6; Platelets 209.0; Potassium 4.2; Sodium 136; TSH 2.22    Lipid Panel Lab Results  Component Value Date   CHOL 131 09/19/2019   HDL 53.70 09/19/2019   LDLCALC 63 09/19/2019   TRIG 70.0 09/19/2019      Wt Readings from Last 3 Encounters:  10/31/19 199 lb (90.3 kg)  09/19/19 203 lb (92.1 kg)  10/25/18 226 lb (102.5 kg)     ASSESSMENT AND PLAN:  Cardiomyopathy, ischemic - Plan: EKG 12-Lead She had full recovery and ejection fraction most recently 50-55% in January 2018 Euvolemic, no further testing needed  NSVT (nonsustained ventricular tachycardia) ([place])  denies any palpitations concerning for arrhythmia Stay on metoprolol  Hyperlipidemia, mixed Cholesterol at goal, no changes made  CAD in native artery Continue low-dose aspirin and Brilinta  60 mg twice daily Coupon  provided Discussed with her, she will stay on it for now Denies significant bruising  Type 2 diabetes mellitus with other circulatory complication, without long-term current use of insulin (H[facility]) A1c at goal 5.4 Recommended further weight loss walking program  Disposition:   F/U  12 months   Total encounter time more than 25 minutes  Greater than 50% was spent in counseling and coordination of care with the patient    Orders Placed This Encounter  Procedures  . EKG 12-Lead     Signed, Esmond Plants, M.D., Ph.D. 10/31/2019  [place], [place]

## 2019-10-31 ENCOUNTER — Other Ambulatory Visit: Payer: Self-pay

## 2019-10-31 ENCOUNTER — Encounter: Payer: Self-pay | Admitting: Cardiovascular Disease

## 2019-10-31 ENCOUNTER — Ambulatory Visit: Payer: BLUE CROSS/BLUE SHIELD | Admitting: Cardiovascular Disease

## 2019-10-31 VITALS — BP 117/77 | HR 64 | Ht 64.0 in | Wt 199.0 lb

## 2019-10-31 DIAGNOSIS — E782 Mixed hyperlipidemia: Secondary | ICD-10-CM | POA: Diagnosis not present

## 2019-10-31 DIAGNOSIS — E1159 Type 2 diabetes mellitus with other circulatory complications: Secondary | ICD-10-CM | POA: Diagnosis not present

## 2019-10-31 DIAGNOSIS — I472 Ventricular tachycardia: Secondary | ICD-10-CM

## 2019-10-31 DIAGNOSIS — Z720 Tobacco use: Secondary | ICD-10-CM

## 2019-10-31 DIAGNOSIS — I25118 Atherosclerotic heart disease of native coronary artery with other forms of angina pectoris: Secondary | ICD-10-CM

## 2019-10-31 DIAGNOSIS — I255 Ischemic cardiomyopathy: Secondary | ICD-10-CM

## 2019-10-31 DIAGNOSIS — I4729 Other ventricular tachycardia: Secondary | ICD-10-CM

## 2019-10-31 DIAGNOSIS — R002 Palpitations: Secondary | ICD-10-CM

## 2019-10-31 MED ORDER — TICAGRELOR 60 MG PO TABS: 60.0000 mg | ORAL_TABLET | Freq: Two times a day (BID) | ORAL | 3 refills | Status: DC

## 2019-10-31 NOTE — Patient Instructions (Addendum)
Medication Instructions:  No changes  Coupon for brilinta  If you need a refill on your cardiac medications before your next appointment, please call your pharmacy.    Lab work: No new labs needed   If you have labs (blood work) drawn today and your tests are completely normal, you will receive your results only by: Marland Kitchen MyChart Message (if you have MyChart) OR . A paper copy in the mail If you have any lab test that is abnormal or we need to change your treatment, we will call you to review the results.   Testing/Procedures: No new testing needed   Follow-Up: At [facility], you and your health needs are our priority.  As part of our continuing mission to provide you with exceptional heart care, we have created designated Provider Care Teams.  These Care Teams include your primary Cardiologist (physician) and Advanced Practice Providers (APPs -  Physician Assistants and Nurse Practitioners) who all work together to provide you with the care you need, when you need it.  . You will need a follow up appointment in 12 months .   Please call our office 2 months in advance to schedule this appointment.    . Providers on your designated Care Team:   . Murray Hodgkins, NP . Christell Faith, PA-C . Marrianne Mood, PA-C  Any Other Special Instructions Will Be Listed Below (If Applicable).  For educational health videos Log in to : [email or web address] Or : [email or web address], password : [email or web address]

## 2019-11-07 ENCOUNTER — Other Ambulatory Visit: Payer: Self-pay | Admitting: Family Medicine

## 2019-11-07 DIAGNOSIS — E782 Mixed hyperlipidemia: Secondary | ICD-10-CM

## 2019-11-08 ENCOUNTER — Other Ambulatory Visit: Payer: Self-pay

## 2019-11-15 ENCOUNTER — Other Ambulatory Visit: Payer: Self-pay

## 2019-11-15 DIAGNOSIS — E119 Type 2 diabetes mellitus without complications: Secondary | ICD-10-CM | POA: Diagnosis not present

## 2019-11-15 DIAGNOSIS — C519 Malignant neoplasm of vulva, unspecified: Secondary | ICD-10-CM | POA: Diagnosis not present

## 2019-11-15 MED ORDER — METFORMIN HCL 500 MG PO TABS: 500.0000 mg | ORAL_TABLET | Freq: Two times a day (BID) | ORAL | 3 refills | Status: DC

## 2019-11-15 NOTE — Telephone Encounter (Signed)
Last OV 09/19/19 Last fill 09/19/19  #60/3

## 2019-11-16 ENCOUNTER — Other Ambulatory Visit: Payer: Self-pay | Admitting: Cardiovascular Disease

## 2019-12-06 ENCOUNTER — Other Ambulatory Visit: Payer: Self-pay

## 2019-12-06 MED ORDER — METOPROLOL SUCCINATE ER 25 MG PO TB24: 25.0000 mg | ORAL_TABLET | Freq: Every day | ORAL | 3 refills | Status: DC

## 2020-02-03 ENCOUNTER — Other Ambulatory Visit: Payer: Self-pay

## 2020-02-03 MED ORDER — ASPIRIN 81 MG PO TBEC: 81.0000 mg | DELAYED_RELEASE_TABLET | Freq: Every day | ORAL | 3 refills | Status: DC

## 2020-03-16 ENCOUNTER — Telehealth: Payer: Self-pay | Admitting: General Practice

## 2020-03-16 MED ORDER — METFORMIN HCL 500 MG PO TABS: ORAL_TABLET | ORAL | 0 refills | Status: DC

## 2020-03-16 NOTE — Telephone Encounter (Signed)
Sent in refill with note to schedule appt for future fills/thx dmf

## 2020-03-16 NOTE — Telephone Encounter (Signed)
Patient is calling and requesting a refill for metformin sent to [facility] in [place]. Informed patient that she needed to schedule a TOC appointment due to Dr. Deborra Medina leaving the practice. CB is [phone]

## 2020-03-20 ENCOUNTER — Other Ambulatory Visit: Payer: Self-pay

## 2020-03-20 DIAGNOSIS — E782 Mixed hyperlipidemia: Secondary | ICD-10-CM

## 2020-03-20 MED ORDER — ATORVASTATIN CALCIUM 20 MG PO TABS: 20.0000 mg | ORAL_TABLET | Freq: Every day | ORAL | 2 refills | Status: DC

## 2020-04-12 ENCOUNTER — Other Ambulatory Visit: Payer: Self-pay | Admitting: Family Medicine

## 2020-04-12 NOTE — Telephone Encounter (Signed)
Must sched with new provider and was advised this with last fill/thx dmf

## 2020-05-02 ENCOUNTER — Encounter: Payer: Self-pay | Admitting: Family Medicine

## 2020-05-02 ENCOUNTER — Other Ambulatory Visit: Payer: Self-pay

## 2020-05-02 ENCOUNTER — Ambulatory Visit (INDEPENDENT_AMBULATORY_CARE_PROVIDER_SITE_OTHER): Payer: BC Managed Care – PPO | Admitting: Family Medicine

## 2020-05-02 VITALS — BP 108/60 | HR 64 | Temp 98.0°F | Ht 64.0 in | Wt 203.0 lb

## 2020-05-02 DIAGNOSIS — I255 Ischemic cardiomyopathy: Secondary | ICD-10-CM

## 2020-05-02 DIAGNOSIS — M7989 Other specified soft tissue disorders: Secondary | ICD-10-CM

## 2020-05-02 DIAGNOSIS — E1159 Type 2 diabetes mellitus with other circulatory complications: Secondary | ICD-10-CM

## 2020-05-02 DIAGNOSIS — E118 Type 2 diabetes mellitus with unspecified complications: Secondary | ICD-10-CM

## 2020-05-02 DIAGNOSIS — I25118 Atherosclerotic heart disease of native coronary artery with other forms of angina pectoris: Secondary | ICD-10-CM

## 2020-05-02 DIAGNOSIS — I252 Old myocardial infarction: Secondary | ICD-10-CM

## 2020-05-02 DIAGNOSIS — E782 Mixed hyperlipidemia: Secondary | ICD-10-CM

## 2020-05-02 LAB — POCT GLYCOSYLATED HEMOGLOBIN (HGB A1C): Hemoglobin A1C: 6.3 % — AB (ref 4.0–5.6)

## 2020-05-02 MED ORDER — METOPROLOL SUCCINATE ER 25 MG PO TB24: 25.0000 mg | ORAL_TABLET | Freq: Every day | ORAL | 3 refills | Status: DC

## 2020-05-02 MED ORDER — ASPIRIN 81 MG PO TBEC: 81.0000 mg | DELAYED_RELEASE_TABLET | Freq: Every day | ORAL | 3 refills | Status: DC

## 2020-05-02 MED ORDER — LISINOPRIL 5 MG PO TABS: 5.0000 mg | ORAL_TABLET | Freq: Every day | ORAL | 3 refills | Status: DC

## 2020-05-02 MED ORDER — ATORVASTATIN CALCIUM 20 MG PO TABS: 20.0000 mg | ORAL_TABLET | Freq: Every day | ORAL | 3 refills | Status: DC

## 2020-05-02 NOTE — Assessment & Plan Note (Signed)
Slightly worse in the setting of poor diet adherence. Continue metformin BID. Continue statin and ace-I. Complications include HLD and atherosclerosis. Return for annual in 6 months

## 2020-05-02 NOTE — Progress Notes (Signed)
Subjective:     Karen Hartman is a 64 y.o. female presenting for Diabetes     HPI   #Diabetes Currently taking metformin (Glucophage, [place])  Using medications without difficulties: Yes Hypoglycemic episodes:does not check Hyperglycemic episodes:does not check Feet problems:No  Blood Sugars averaging: does not check Last HgbA1c:  Lab Results  Component Value Date   HGBA1C 6.3 (A) 05/02/2020    Diabetes Health Maintenance Due:    Diabetes Health Maintenance Due  Topic Date Due  . OPHTHALMOLOGY EXAM  08/31/2020  . HEMOGLOBIN A1C  11/01/2020  . FOOT EXAM  05/02/2021    #cardiomyopathy - taking medication - no issues  Review of Systems   Social History   Tobacco Use  Smoking Status Former Smoker  . Packs/day: 0.50  . Years: 40.00  . Pack years: 20.00  . Types: Cigarettes  . Quit date: 2017  . Years since quitting: 4.4  Smokeless Tobacco Never Used        Objective:    BP Readings from Last 3 Encounters:  05/02/20 108/60  10/31/19 117/77  09/19/19 122/74   Wt Readings from Last 3 Encounters:  05/02/20 203 lb (92.1 kg)  10/31/19 199 lb (90.3 kg)  09/19/19 203 lb (92.1 kg)    BP 108/60   Pulse 64   Temp 98 F (36.7 C) (Temporal)   Ht 5\' 4"  (1.626 m)   Wt 203 lb (92.1 kg)   SpO2 98%   BMI 34.84 kg/m    Physical Exam Constitutional:      General: She is not in acute distress.    Appearance: She is well-developed. She is not diaphoretic.  HENT:     Right Ear: External ear normal.     Left Ear: External ear normal.  Eyes:     Conjunctiva/sclera: Conjunctivae normal.  Cardiovascular:     Rate and Rhythm: Normal rate and regular rhythm.     Heart sounds: No murmur heard.   Pulmonary:     Effort: Pulmonary effort is normal. No respiratory distress.     Breath sounds: Normal breath sounds. No wheezing.  Musculoskeletal:     Cervical back: Neck supple.  Skin:    General: Skin is warm and dry.     Capillary Refill: Capillary  refill takes less than 2 seconds.  Neurological:     Mental Status: She is alert. Mental status is at baseline.  Psychiatric:        Mood and Affect: Mood normal.        Behavior: Behavior normal.    Diabetic Foot Exam - Simple   Simple Foot Form Diabetic Foot exam was performed with the following findings: Yes 05/02/2020  9:51 AM  Visual Inspection No deformities, no ulcerations, no other skin breakdown bilaterally: Yes Sensation Testing Intact to touch and monofilament testing bilaterally: Yes Pulse Check Posterior Tibialis and Dorsalis pulse intact bilaterally: Yes Comments            Assessment & Plan:   Problem List Items Addressed This Visit      Cardiovascular and Mediastinum   Cardiomyopathy, ischemic    Improved per most recent ECHO. Follows with cardiology. Cont current medications      Relevant Medications   metoprolol succinate (TOPROL-XL) 25 MG 24 hr tablet   lisinopril (ZESTRIL) 5 MG tablet   aspirin (ASPIRIN LOW DOSE) 81 MG EC tablet   atorvastatin (LIPITOR) 20 MG tablet   Atherosclerosis of native coronary artery of native heart with stable  angina pectoris (HCC)    Stable on ASA and statin.       Relevant Medications   metoprolol succinate (TOPROL-XL) 25 MG 24 hr tablet   lisinopril (ZESTRIL) 5 MG tablet   aspirin (ASPIRIN LOW DOSE) 81 MG EC tablet   atorvastatin (LIPITOR) 20 MG tablet     Endocrine   Controlled diabetes mellitus type 2 with complications ([place]) - Primary    Slightly worse in the setting of poor diet adherence. Continue metformin BID. Continue statin and ace-I. Complications include HLD and atherosclerosis. Return for annual in 6 months      Relevant Medications   lisinopril (ZESTRIL) 5 MG tablet   aspirin (ASPIRIN LOW DOSE) 81 MG EC tablet   atorvastatin (LIPITOR) 20 MG tablet     Other   Hyperlipidemia, mixed   Relevant Medications   metoprolol succinate (TOPROL-XL) 25 MG 24 hr tablet   lisinopril (ZESTRIL) 5 MG tablet     aspirin (ASPIRIN LOW DOSE) 81 MG EC tablet   atorvastatin (LIPITOR) 20 MG tablet   History of heart attack    4 years ago, s/p stent placement. Follows with cardiology. Cont current medications. Refill provided. Labs at next visit.       Relevant Medications   metoprolol succinate (TOPROL-XL) 25 MG 24 hr tablet   lisinopril (ZESTRIL) 5 MG tablet   aspirin (ASPIRIN LOW DOSE) 81 MG EC tablet   atorvastatin (LIPITOR) 20 MG tablet   Left leg swelling    Stable today. Cont compression socks prn          Return in about 5 months (around 10/02/2020) for annual.  Lesleigh Noe, MD  This visit occurred during the SARS-CoV-2 public health emergency.  Safety protocols were in place, including screening questions prior to the visit, additional usage of staff PPE, and extensive cleaning of exam room while observing appropriate contact time as indicated for disinfecting solutions.

## 2020-05-02 NOTE — Assessment & Plan Note (Signed)
Stable on ASA and statin.

## 2020-05-02 NOTE — Assessment & Plan Note (Signed)
Improved per most recent ECHO. Follows with cardiology. Cont current medications

## 2020-05-02 NOTE — Assessment & Plan Note (Signed)
Stable today. Cont compression socks prn

## 2020-05-02 NOTE — Assessment & Plan Note (Addendum)
4 years ago, s/p stent placement. Follows with cardiology. Cont current medications. Refill provided. Labs at next visit.

## 2020-06-20 ENCOUNTER — Encounter: Payer: BC Managed Care – PPO | Admitting: Family Medicine

## 2020-08-30 DIAGNOSIS — D225 Melanocytic nevi of trunk: Secondary | ICD-10-CM | POA: Diagnosis not present

## 2020-08-30 DIAGNOSIS — D2261 Melanocytic nevi of right upper limb, including shoulder: Secondary | ICD-10-CM | POA: Diagnosis not present

## 2020-08-30 DIAGNOSIS — L900 Lichen sclerosus et atrophicus: Secondary | ICD-10-CM | POA: Diagnosis not present

## 2020-08-30 DIAGNOSIS — X32XXXA Exposure to sunlight, initial encounter: Secondary | ICD-10-CM | POA: Diagnosis not present

## 2020-08-30 DIAGNOSIS — D2262 Melanocytic nevi of left upper limb, including shoulder: Secondary | ICD-10-CM | POA: Diagnosis not present

## 2020-08-30 DIAGNOSIS — L570 Actinic keratosis: Secondary | ICD-10-CM | POA: Diagnosis not present

## 2020-09-04 LAB — HM DIABETES EYE EXAM

## 2020-09-10 ENCOUNTER — Other Ambulatory Visit: Payer: Self-pay | Admitting: Family Medicine

## 2020-09-10 DIAGNOSIS — Z1231 Encounter for screening mammogram for malignant neoplasm of breast: Secondary | ICD-10-CM

## 2020-09-20 ENCOUNTER — Encounter: Payer: Self-pay | Admitting: Family Medicine

## 2020-09-26 ENCOUNTER — Ambulatory Visit (INDEPENDENT_AMBULATORY_CARE_PROVIDER_SITE_OTHER): Payer: BC Managed Care – PPO | Admitting: Family Medicine

## 2020-09-26 ENCOUNTER — Other Ambulatory Visit: Payer: Self-pay

## 2020-09-26 ENCOUNTER — Encounter: Payer: Self-pay | Admitting: Family Medicine

## 2020-09-26 VITALS — BP 100/60 | HR 59 | Temp 96.7°F | Ht 64.5 in | Wt 203.5 lb

## 2020-09-26 DIAGNOSIS — Z0000 Encounter for general adult medical examination without abnormal findings: Secondary | ICD-10-CM | POA: Diagnosis not present

## 2020-09-26 DIAGNOSIS — E782 Mixed hyperlipidemia: Secondary | ICD-10-CM

## 2020-09-26 DIAGNOSIS — E118 Type 2 diabetes mellitus with unspecified complications: Secondary | ICD-10-CM

## 2020-09-26 DIAGNOSIS — I25118 Atherosclerotic heart disease of native coronary artery with other forms of angina pectoris: Secondary | ICD-10-CM

## 2020-09-26 LAB — CBC WITH DIFFERENTIAL/PLATELET
Basophils Absolute: 0 10*3/uL (ref 0.0–0.1)
Basophils Relative: 0.7 % (ref 0.0–3.0)
Eosinophils Absolute: 0 10*3/uL (ref 0.0–0.7)
Eosinophils Relative: 0.5 % (ref 0.0–5.0)
HCT: 42.8 % (ref 36.0–46.0)
Hemoglobin: 14.3 g/dL (ref 12.0–15.0)
Lymphocytes Relative: 24.2 % (ref 12.0–46.0)
Lymphs Abs: 1.3 10*3/uL (ref 0.7–4.0)
MCHC: 33.4 g/dL (ref 30.0–36.0)
MCV: 90 fl (ref 78.0–100.0)
Monocytes Absolute: 0.4 10*3/uL (ref 0.1–1.0)
Monocytes Relative: 8.2 % (ref 3.0–12.0)
Neutro Abs: 3.5 10*3/uL (ref 1.4–7.7)
Neutrophils Relative %: 66.4 % (ref 43.0–77.0)
Platelets: 209 10*3/uL (ref 150.0–400.0)
RBC: 4.76 Mil/uL (ref 3.87–5.11)
RDW: 13.5 % (ref 11.5–15.5)
WBC: 5.2 10*3/uL (ref 4.0–10.5)

## 2020-09-26 LAB — COMPREHENSIVE METABOLIC PANEL
ALT: 25 U/L (ref 0–35)
AST: 29 U/L (ref 0–37)
Albumin: 4.1 g/dL (ref 3.5–5.2)
Alkaline Phosphatase: 61 U/L (ref 39–117)
BUN: 22 mg/dL (ref 6–23)
CO2: 29 mEq/L (ref 19–32)
Calcium: 9.3 mg/dL (ref 8.4–10.5)
Chloride: 102 mEq/L (ref 96–112)
Creatinine, Ser: 0.65 mg/dL (ref 0.40–1.20)
GFR: 93.68 mL/min (ref >60.00)
Glucose, Bld: 136 mg/dL — ABNORMAL HIGH (ref 70–99)
Potassium: 4.3 mEq/L (ref 3.5–5.1)
Sodium: 138 mEq/L (ref 135–145)
Total Bilirubin: 0.6 mg/dL (ref 0.2–1.2)
Total Protein: 6.9 g/dL (ref 6.0–8.3)

## 2020-09-26 LAB — LIPID PANEL
Cholesterol: 145 mg/dL (ref 0–200)
HDL: 63.9 mg/dL (ref >39.00)
LDL Cholesterol: 72 mg/dL (ref 0–99)
NonHDL: 80.82
Total CHOL/HDL Ratio: 2
Triglycerides: 46 mg/dL (ref 0.0–149.0)
VLDL: 9.2 mg/dL (ref 0.0–40.0)

## 2020-09-26 LAB — HEMOGLOBIN A1C: Hgb A1c MFr Bld: 6 % (ref 4.6–6.5)

## 2020-09-26 NOTE — Progress Notes (Signed)
Annual Exam   Chief Complaint:  Chief Complaint  Patient presents with  . Annual Exam  . Transitions Of Care  . Nasal Congestion    just runny, never congested     History of Present Illness:  Ms. Karen Hartman is a 64 y.o. No obstetric history on file. who LMP was No LMP recorded. Patient is postmenopausal., presents today for her annual examination.    Constantly runny nose No itchy eyes, sore throat Treatment: afrin, allegra  Nutrition She does get adequate calcium and Vitamin D in her diet. Diet: eats cheese and yogurt, generally healthy but does overeat Exercise: walking with her dog daily, yard work  Engineer, materials The patient wears seatbelts: yes.     The patient feels safe at home and in their relationships: yes.  Dentist - yes Eye doctor: yes    GYN She is single partner, contraception - post menopausal status.    Cervical Cancer Screening (21-65):   Last Pap:   November 2020 Results were: no abnormalities /neg HPV DNA   Breast Cancer Screening (Age 14-74):  There is no FH of breast cancer. There is no FH of ovarian cancer. BRCA screening Not Indicated.  Last Mammogram: 10/2019 The patient does want a mammogram this year.    Colon Cancer Screening:  Age 24-75 yo - benefits outweigh the risk. Adults 47-85 yo who have never been screened benefit.  Benefits: 134000 people in 2016 will be diagnosed and 49,000 will die - early detection helps [place]: Complications 2/2 to colonoscopy High Risk (Colonoscopy): genetic disorder (Lynch syndrome or familial adenomatous polyposis), personal hx of IBD, previous adenomatous polyp, or previous colorectal cancer, FamHx start 10 years before the age at diagnosis, increased in males and black race  Options:  FIT - looks for hemoglobin (blood in the stool) - specific and fairly sensitive - must be done annually Cologuard - looks for DNA and blood - more sensitive - therefore can have more false positives, every 3  years Colonoscopy - every 10 years if normal - sedation, bowl prep, must have someone drive you  Shared decision making and the patient had decided to do cologuard in 2019 - will continue.   Social History   Tobacco Use  Smoking Status Former Smoker  . Packs/day: 0.50  . Years: 40.00  . Pack years: 20.00  . Types: Cigarettes  . Quit date: 2017  . Years since quitting: 4.8  Smokeless Tobacco Never Used    Lung Cancer Screening (Ages 59-80): yes 20 year pack history? Yes Current Tobacco user? Yes Quit less than 15 years ago? Yes Interested in low dose CT for lung cancer screening? Will consider for next year  Weight Wt Readings from Last 3 Encounters:  09/26/20 203 lb 8 oz (92.3 kg)  05/02/20 203 lb (92.1 kg)  10/31/19 199 lb (90.3 kg)   Patient has high BMI  BMI Readings from Last 1 Encounters:  09/26/20 34.39 kg/m     Chronic disease screening Blood pressure monitoring:  BP Readings from Last 3 Encounters:  09/26/20 100/60  05/02/20 108/60  10/31/19 117/77    Lipid Monitoring: Indication for screening: age >43, obesity, diabetes, family hx, CV risk factors.  Lipid screening: Yes  Lab Results  Component Value Date   CHOL 131 09/19/2019   HDL 53.70 09/19/2019   LDLCALC 63 09/19/2019   TRIG 70.0 09/19/2019   CHOLHDL 2 09/19/2019     Diabetes Screening: age >69, overweight, family hx, PCOS, hx of gestational  diabetes, at risk ethnicity Diabetes Screening screening: Yes  Lab Results  Component Value Date   HGBA1C 6.3 (A) 05/02/2020     Past Medical History:  Diagnosis Date  . Acute ST elevation myocardial infarction (STEMI) ([place]) 11/01/2016  . CAD in native artery    a. inf/lat STEMI 10/2016 with occluded RCA s/p DES, moderate nonobstructive LAD stenosis and mild nonobstructive LCx stenosis, EF 50-55% -> taken back to cath lab for acute stent thrombosis s/p thromectomy/PTCA of RCA same day. EF 30-35% by echo 11/02/16.   . Diabetes ([place])   .  Hyperlipidemia, mixed 11/01/2016  . Ischemic cardiomyopathy   . Morbid obesity ([employer])   . NSVT (nonsustained ventricular tachycardia) ([place])    a. during adm 10/2016 for STEMI.  Marland Kitchen NSVT (nonsustained ventricular tachycardia) ([place]) 11/05/2016  . STEMI involving right coronary artery ([place]) 11/01/2016  . Tobacco abuse   . Torsades de pointes ([place])    a. during adm 10/2016 for STEMI, felt infarct related.  . Torsades de pointes ([place])   . Vulvar cancer [facility])     Past Surgical History:  Procedure Laterality Date  . CARDIAC CATHETERIZATION N/A 11/01/2016   Procedure: Left Heart Cath and Coronary Angiography;  Surgeon: Sherren Mocha, MD;  Location: [place] [facility];  Service: Cardiovascular;  Laterality: N/A;  . CARDIAC CATHETERIZATION N/A 11/01/2016   Procedure: Coronary Stent Intervention;  Surgeon: Sherren Mocha, MD;  Location: [place] [facility];  Service: Cardiovascular;  Laterality: N/A;  DES to Prox RCA- Promus 3.5x28  . CESAREAN SECTION    . CORONARY THROMBECTOMY N/A 11/01/2016   Procedure: Coronary Thrombectomy;  Surgeon: Sherren Mocha, MD;  Location: [place] [facility];  Service: Cardiovascular;  Laterality: N/A;  . INTRAVASCULAR ULTRASOUND/IVUS N/A 11/01/2016   Procedure: Intravascular Ultrasound/IVUS;  Surgeon: Sherren Mocha, MD;  Location: [place] [facility];  Service: Cardiovascular;  Laterality: N/A;  . LAMINECTOMY  1986  . LEFT HEART CATH AND CORONARY ANGIOGRAPHY N/A 11/01/2016   Procedure: Left Heart Cath and Coronary Angiography;  Surgeon: Sherren Mocha, MD;  Location: [place] [facility];  Service: Cardiovascular;  Laterality: N/A;  . TEMPORARY PACEMAKER N/A 11/01/2016   Procedure: Temporary Pacemaker;  Surgeon: Sherren Mocha, MD;  Location: [place] [facility];  Service: Cardiovascular;  Laterality: N/A;  . TONSILLECTOMY  1981  . VULVECTOMY Right 2016  . WISDOM TOOTH EXTRACTION      Prior to Admission medications   Medication Sig Start Date End Date Taking?  Authorizing Provider  acetaminophen (TYLENOL) 500 MG tablet Take 500 mg by mouth every 6 (six) hours as needed for mild pain.   Yes [provider]  aspirin (ASPIRIN LOW DOSE) 81 MG EC tablet Take 1 tablet (81 mg total) by mouth daily. Swallow whole. 05/02/20  Yes Lesleigh Noe, MD  atorvastatin (LIPITOR) 20 MG tablet Take 1 tablet (20 mg total) by mouth daily. 05/02/20  Yes Lesleigh Noe, MD  calcium carbonate (TUMS - DOSED IN MG ELEMENTAL CALCIUM) 500 MG chewable tablet Chew 1 tablet by mouth daily as needed for indigestion or heartburn.   Yes [provider]  clobetasol ointment (TEMOVATE) 1.60 % Apply 1 application topically as needed.  09/01/19  Yes [provider]  docusate sodium (COLACE) 100 MG capsule Take 100 mg by mouth every other day.    Yes [provider]  lisinopril (ZESTRIL) 5 MG tablet Take 1 tablet (5 mg total) by mouth daily. 05/02/20  Yes Lesleigh Noe, MD  metFORMIN (  GLUCOPHAGE) 500 MG tablet Take 1bid (Plz sched appt for future fills) 03/16/20  Yes Libby Maw, MD  metoprolol succinate (TOPROL-XL) 25 MG 24 hr tablet Take 1 tablet (25 mg total) by mouth daily. 05/02/20  Yes Lesleigh Noe, MD  nitroGLYCERIN (NITROSTAT) 0.4 MG SL tablet Place 1 tablet (0.4 mg total) under the tongue every 5 (five) minutes as needed for chest pain (up to 3 doses). 11/05/16  Yes Dunn, Nedra Hai, PA-C  ticagrelor (BRILINTA) 60 MG TABS tablet Take 1 tablet (60 mg total) by mouth 2 (two) times daily. 10/31/19  Yes Minna Merritts, MD    No Known Allergies  Gynecologic History: No LMP recorded. Patient is postmenopausal.  Obstetric History: No obstetric history on file.  Social History   Socioeconomic History  . Marital status: Married    Spouse name: Legrand Como  . Number of children: 1  . Years of education: BS college  . Highest education level: Not on file  Occupational History  . Not on file  Tobacco Use  . Smoking status: Former Smoker     Packs/day: 0.50    Years: 40.00    Pack years: 20.00    Types: Cigarettes    Quit date: 2017    Years since quitting: 4.8  . Smokeless tobacco: Never Used  Vaping Use  . Vaping Use: Never used  Substance and Sexual Activity  . Alcohol use: No  . Drug use: No  . Sexual activity: Yes    Birth control/protection: Post-menopausal  Other Topics Concern  . Not on file  Social History Narrative   05/02/20   From: the area   Living: with Legrand Como, husband since 1982   Work: [employer], Soil scientist      Family: Son - Juanda Crumble, no grandchildren - he is nearby, good relationship      Enjoys: yardwork, Chemical engineer, baking      Exercise: walking the dog daily, slow pace   Diet: diabetic diet      Safety   Seat belts: Yes    Guns: Yes  and secure   Safe in relationships: Yes    Social Determinants of Health   Financial Resource Strain:   . Difficulty of Paying Living Expenses: Not on file  Food Insecurity:   . Worried About Charity fundraiser in the Last Year: Not on file  . Ran Out of Food in the Last Year: Not on file  Transportation Needs:   . Lack of Transportation (Medical): Not on file  . Lack of Transportation (Non-Medical): Not on file  Physical Activity:   . Days of Exercise per Week: Not on file  . Minutes of Exercise per Session: Not on file  Stress:   . Feeling of Stress : Not on file  Social Connections:   . Frequency of Communication with Friends and Family: Not on file  . Frequency of Social Gatherings with Friends and Family: Not on file  . Attends Religious Services: Not on file  . Active Member of Clubs or Organizations: Not on file  . Attends Archivist Meetings: Not on file  . Marital Status: Not on file  Intimate Partner Violence:   . Fear of Current or Ex-Partner: Not on file  . Emotionally Abused: Not on file  . Physically Abused: Not on file  . Sexually Abused: Not on file    Family History  Problem Relation Age of Onset  .  Uterine cancer Mother   .  Heart disease Mother   . AAA (abdominal aortic aneurysm) Father   . Breast cancer Neg Hx     Review of Systems  Constitutional: Negative for chills and fever.  HENT: Negative for congestion and sore throat.        Runny nose  Eyes: Negative for blurred vision and double vision.  Respiratory: Negative for shortness of breath.   Cardiovascular: Negative for chest pain.  Gastrointestinal: Negative for heartburn, nausea and vomiting.  Genitourinary: Negative.   Musculoskeletal: Negative.  Negative for myalgias.  Skin: Negative for rash.  Neurological: Negative for dizziness and headaches.  Endo/Heme/Allergies: Does not bruise/bleed easily.  Psychiatric/Behavioral: Negative for depression. The patient is not nervous/anxious.      Physical Exam BP 100/60   Pulse (!) 59   Temp (!) 96.7 F (35.9 C) (Temporal)   Ht 5' 4.5" (1.638 m)   Wt 203 lb 8 oz (92.3 kg)   SpO2 98%   BMI 34.39 kg/m    BP Readings from Last 3 Encounters:  09/26/20 100/60  05/02/20 108/60  10/31/19 117/77      Physical Exam Constitutional:      General: She is not in acute distress.    Appearance: She is well-developed. She is not diaphoretic.  HENT:     Head: Normocephalic and atraumatic.     Right Ear: External ear normal.     Left Ear: External ear normal.     Nose: Nose normal.  Eyes:     General: No scleral icterus.    Conjunctiva/sclera: Conjunctivae normal.  Cardiovascular:     Rate and Rhythm: Normal rate and regular rhythm.     Heart sounds: No murmur heard.   Pulmonary:     Effort: Pulmonary effort is normal. No respiratory distress.     Breath sounds: Normal breath sounds. No wheezing.  Abdominal:     General: Bowel sounds are normal. There is no distension.     Palpations: Abdomen is soft. There is no mass.     Tenderness: There is no abdominal tenderness. There is no guarding or rebound.  Musculoskeletal:        General: Normal range of motion.      Cervical back: Neck supple.  Lymphadenopathy:     Cervical: No cervical adenopathy.  Skin:    General: Skin is warm and dry.     Capillary Refill: Capillary refill takes less than 2 seconds.  Neurological:     Mental Status: She is alert and oriented to person, place, and time.     Deep Tendon Reflexes: Reflexes normal.  Psychiatric:        Behavior: Behavior normal.     Results:  PHQ-9:    Office Visit from 09/19/2019 in [facility]  PHQ-9 Total Score 2        Assessment: 64 y.o. No obstetric history on file. female here for routine annual physical examination.  Plan: Problem List Items Addressed This Visit    None      Screening: -- Blood pressure screen normal -- cholesterol screening: will obtain -- Weight screening: obese: discussed management options, including lifestyle, dietary, and exercise. -- Diabetes Screening: will obtain -- Nutrition: Encouraged healthy diet  The 10-year ASCVD risk score Mikey Bussing DC Jr., et al., 2013) is: 4.2%   Values used to calculate the score:     Age: 77 years     Sex: Female     Is Non-Hispanic African American: No     Diabetic:  Yes     Tobacco smoker: No     Systolic Blood Pressure: 921 mmHg     Is BP treated: No     HDL Cholesterol: 53.7 mg/dL     Total Cholesterol: 131 mg/dL  -- Statin therapy for Age 65-75 with CVD risk >7.5%  Psych -- Depression screening (PHQ-9):    Office Visit from 09/19/2019 in [place]  PHQ-9 Total Score 2       Safety -- tobacco screening: not using -- alcohol screening:  low-risk usage. -- no evidence of domestic violence or intimate partner violence.   Cancer Screening -- pap smear not collected per ASCCP guidelines -- family history of breast cancer screening: done. not at high risk. -- Mammogram - she is getting next month -- Colon cancer (age 43+)-- cologuard up to date  -- Lung Cancer - declined, will consider next  year  Immunizations Immunization History  Administered Date(s) Administered  . Influenza,inj,Quad PF,6+ Mos 08/16/2020  . Influenza-Unspecified 08/21/2016  . PFIZER SARS-COV-2 Vaccination 11/01/2019, 11/22/2019, 08/31/2020  . Pneumococcal Polysaccharide-23 03/16/2017  . Tdap 08/24/2017    -- flu vaccine up to date -- TDAP q10 years up to  date  -- PPSV-23 (19-64 with chronic disease or smoking) up to date -- PCV-13 (age >23) - one dose followed by PPSV-23 1 year later not indicated -- Covid-19 Vaccine up to date   Encouraged healthy diet and exercise. Encouraged regular vision and dental care.    Lesleigh Noe, MD

## 2020-09-26 NOTE — Patient Instructions (Addendum)
Runny nose - try flonase - if ineffective can try azelastine nasal spray  For bone health  1) 800 units of Vitamin D daily 2) Get 1200 mg of elemental calcium --- this is best from your diet. Try to track how much calcium you get on a typical day. You could find ways to add more (dairy products, leafy greens). Take a supplement for whatever you don't typically get so you reach 1200 mg of calcium.  3) Physical activity (ideally weight bearing) - like walking briskly 30 minutes 5 days a week.

## 2020-10-01 ENCOUNTER — Other Ambulatory Visit: Payer: Self-pay | Admitting: Family Medicine

## 2020-10-18 ENCOUNTER — Ambulatory Visit
Admission: RE | Admit: 2020-10-18 | Discharge: 2020-10-18 | Disposition: A | Discharge status: Home / Self Care | Payer: BC Managed Care – PPO | Source: Ambulatory Visit | Attending: Family Medicine | Admitting: Family Medicine

## 2020-10-18 ENCOUNTER — Other Ambulatory Visit: Payer: Self-pay

## 2020-10-18 DIAGNOSIS — Z1231 Encounter for screening mammogram for malignant neoplasm of breast: Secondary | ICD-10-CM | POA: Diagnosis not present

## 2020-11-11 NOTE — Progress Notes (Unsigned)
Cardiology Office Note  Date:  11/12/2020   ID:  Karen Hartman, Karen Hartman 05-27-1956, MRN [id number]  PCP:  Lesleigh Noe, MD   Chief Complaint  Patient presents with  . Other    12 month follow up. Meds reviewed verbally with patient.     HPI:  Karen Hartman a 65 y.o.femalewith history of  morbid obesity,  DM (previously treated with weight loss), tobacco abuse, stopped December 2017 vulvar cancer s/p prior surgery,  CAD with inf-lat STEMI 10/2016 s/p PCI to RCA  Ejection fraction 50 to 55% by echo January 2018 who presents for f/u of her coronary artery disease  Works at [employer], No regular exercise program, weight 12 pounds from prior clinic visits Eating foods high in carbohydrates  Labs reviewed with her today HBA1c 6.0 Total cholesterol trending upwards previously 120s now 150 LDL also trending up now 72  Prefers no additional medications Denies any shortness of breath or chest pain concerning for angina In the past with chronic stable swelling, has not been bad recently  Continues on Brilinta 60 twice daily with aspirin  Stop smoking December 2017 Smoked for 40 years  Discussed follow-up echocardiogram January 2018 showing ejection fraction 50 to 55%  EKG personally reviewed by myself on todays visit Shows normal sinus rhythm rate 71 bpm no significant ST or T wave changes  Other past medical history reviewed After her ST elevation and stent to the RCA, she had recurrent ST elevation concerning for acute stent thrombosis.   treated with Aggrestat and taken back to the cath lab urgently which showed acute stent thrombosis which was treated successfully with aspiration thrombectomy, angiojet thrombectomy, and PTCA reloaded with an additional 180mg  of Brilinta, continued on Aggrestat for 18 hours. felt this event possibly happened due to her delay in digesting Brilinta.   troponin peaked at 29.   2D echo 11/02/16 showed EF 30-35%, grade 1 DD,  mild MR, moderately dilated RV, moderate RV dysfunction, akinesis of the basal and mid inferoseptal, inferior, inferolateral walls, and apical inferior and septal walls.    runs of NSVT as well as Torsades (18 seconds) on telemetry.    treated with heparin, amiodarone and lidocaine.   LifeVest was recommended with repeat echo 30 days - if EF >35 and no shocks, could discontinue Lifevest at that time.    PMH:   has a past medical history of Acute ST elevation myocardial infarction (STEMI) (Karen Hartman) (11/01/2016), CAD in native artery, Diabetes (Karen Hartman), HPV (human papilloma virus) infection (10/20/2017), Hyperlipidemia, mixed (11/01/2016), Ischemic cardiomyopathy, Morbid obesity (Karen Hartman), NSVT (nonsustained ventricular tachycardia) (Karen Hartman), NSVT (nonsustained ventricular tachycardia) (Karen Hartman) (11/05/2016), STEMI involving right coronary artery (Karen Hartman) (11/01/2016), Tobacco abuse, Torsades de pointes (Karen Hartman), Torsades de pointes (Karen Hartman), and Vulvar cancer (Karen Hartman).  PSH:    Past Surgical History:  Procedure Laterality Date  . CARDIAC CATHETERIZATION N/A 11/01/2016   Procedure: Left Heart Cath and Coronary Angiography;  Surgeon: Sherren Mocha, MD;  Location: [place] [facility];  Service: Cardiovascular;  Laterality: N/A;  . CARDIAC CATHETERIZATION N/A 11/01/2016   Procedure: Coronary Stent Intervention;  Surgeon: Sherren Mocha, MD;  Location: [place] [facility];  Service: Cardiovascular;  Laterality: N/A;  DES to Prox RCA- Promus 3.5x28  . CESAREAN SECTION    . CORONARY THROMBECTOMY N/A 11/01/2016   Procedure: Coronary Thrombectomy;  Surgeon: Sherren Mocha, MD;  Location: [facility] [facility];  Service: Cardiovascular;  Laterality: N/A;  . INTRAVASCULAR ULTRASOUND/IVUS N/A 11/01/2016   Procedure: Intravascular Ultrasound/IVUS;  Surgeon: Sherren Mocha, MD;  Location: [facility];  Service: Cardiovascular;  Laterality: N/A;  . LAMINECTOMY  1986  . LEFT HEART CATH AND CORONARY ANGIOGRAPHY N/A 11/01/2016    Procedure: Left Heart Cath and Coronary Angiography;  Surgeon: Sherren Mocha, MD;  Location: [facility];  Service: Cardiovascular;  Laterality: N/A;  . TEMPORARY PACEMAKER N/A 11/01/2016   Procedure: Temporary Pacemaker;  Surgeon: Sherren Mocha, MD;  Location: [facility];  Service: Cardiovascular;  Laterality: N/A;  . TONSILLECTOMY  1981  . VULVECTOMY Right 2016  . WISDOM TOOTH EXTRACTION      Current Outpatient Medications  Medication Sig Dispense Refill  . acetaminophen (TYLENOL) 500 MG tablet Take 500 mg by mouth every 6 (six) hours as needed for mild pain.    Marland Kitchen aspirin (ASPIRIN LOW DOSE) 81 MG EC tablet Take 1 tablet (81 mg total) by mouth daily. Swallow whole. 90 tablet 3  . atorvastatin (LIPITOR) 20 MG tablet Take 1 tablet (20 mg total) by mouth daily. 90 tablet 3  . calcium carbonate (TUMS - DOSED IN MG ELEMENTAL CALCIUM) 500 MG chewable tablet Chew 1 tablet by mouth daily as needed for indigestion or heartburn.    . clobetasol ointment (TEMOVATE) [id number] % Apply 1 application topically as needed.     . docusate sodium (COLACE) 100 MG capsule Take 100 mg by mouth every other day.     . lisinopril (ZESTRIL) 5 MG tablet Take 1 tablet (5 mg total) by mouth daily. 90 tablet 3  . metFORMIN (GLUCOPHAGE) 500 MG tablet TAKE 1 TABLET BY MOUTH TWICE A DAY 180 tablet 3  . metoprolol succinate (TOPROL-XL) 25 MG 24 hr tablet Take 1 tablet (25 mg total) by mouth daily. 90 tablet 3  . nitroGLYCERIN (NITROSTAT) 0.4 MG SL tablet Place 1 tablet (0.4 mg total) under the tongue every 5 (five) minutes as needed for chest pain (up to 3 doses). 25 tablet 3  . ticagrelor (BRILINTA) 60 MG TABS tablet Take 1 tablet (60 mg total) by mouth 2 (two) times daily. 180 tablet 3   No current facility-administered medications for this visit.     Allergies:   Patient has no known allergies.   Social History:  The patient  reports that she quit smoking about 5 years ago. Her smoking use included  cigarettes. She has a 20.00 pack-year smoking history. She has never used smokeless tobacco. She reports that she does not drink alcohol and does not use drugs.   Family History:   family history includes AAA (abdominal aortic aneurysm) in her father; Heart disease in her mother; Uterine cancer in her mother.    Review of Systems: Review of Systems  Constitutional: Positive for weight loss.  Respiratory: Negative.   Cardiovascular: Negative.   Gastrointestinal: Negative.   Musculoskeletal: Negative.   Neurological: Negative.   Psychiatric/Behavioral: Negative.   All other systems reviewed and are negative.   PHYSICAL EXAM: VS:  BP 110/66 (BP Location: Left Arm, Patient Position: Sitting, Cuff Size: Normal)   Pulse 71   Ht 5\' 5"  (1.651 m)   Wt 215 lb (97.5 kg)   SpO2 96%   BMI 35.78 kg/m  , BMI Body mass index is 35.78 kg/m. Constitutional:  oriented to person, place, and time. No distress.  HENT:  Head: Grossly normal Eyes:  no discharge. No scleral icterus.  Neck: No JVD, no carotid bruits  Cardiovascular: Regular rate and rhythm, no murmurs appreciated Pulmonary/Chest: Clear to auscultation bilaterally,  no wheezes or rails Abdominal: Soft.  no distension.  no tenderness.  Musculoskeletal: Normal range of motion Neurological:  normal muscle tone. Coordination normal. No atrophy Skin: Skin warm and dry Psychiatric: normal affect, pleasant   Recent Labs: 09/26/2020: ALT 25; BUN 22; Creatinine, Ser 0.65; Hemoglobin 14.3; Platelets 209.0; Potassium 4.3; Sodium 138    Lipid Panel Lab Results  Component Value Date   CHOL 145 09/26/2020   HDL 63.90 09/26/2020   LDLCALC 72 09/26/2020   TRIG 46.0 09/26/2020      Wt Readings from Last 3 Encounters:  11/12/20 215 lb (97.5 kg)  09/26/20 203 lb 8 oz (92.3 kg)  05/02/20 203 lb (92.1 kg)     ASSESSMENT AND PLAN:  Cardiomyopathy, ischemic -  ejection fraction  50-55% in January 2018 Euvolemic  NSVT (nonsustained  ventricular tachycardia) (HCC)  denies any palpitations concerning for arrhythmia Continue current dose of metoprolol  Hyperlipidemia, mixed With weight gain, numbers have been trending higher Recommended weight loss Declining zetia at this time  CAD in native artery Continue low-dose aspirin  She is 4 years out from her intervention, we can hold the Brilinta  Type 2 diabetes mellitus with other circulatory complication, without long-term current use of insulin (HCC) A1c  6.0 Recommended further weight loss walking program    Total encounter time more than 25 minutes  Greater than 50% was spent in counseling and coordination of care with the patient    No orders of the defined types were placed in this encounter.    Signed, Dossie Arbour, M.D., Ph.D. 11/12/2020  [facility] [place], [place] [phone]

## 2020-11-12 ENCOUNTER — Encounter: Payer: Self-pay | Admitting: Cardiovascular Disease

## 2020-11-12 ENCOUNTER — Ambulatory Visit (INDEPENDENT_AMBULATORY_CARE_PROVIDER_SITE_OTHER): Payer: BC Managed Care – PPO | Admitting: Cardiovascular Disease

## 2020-11-12 ENCOUNTER — Other Ambulatory Visit: Payer: Self-pay

## 2020-11-12 VITALS — BP 110/66 | HR 71 | Ht 65.0 in | Wt 215.0 lb

## 2020-11-12 DIAGNOSIS — I25118 Atherosclerotic heart disease of native coronary artery with other forms of angina pectoris: Secondary | ICD-10-CM

## 2020-11-12 DIAGNOSIS — E1159 Type 2 diabetes mellitus with other circulatory complications: Secondary | ICD-10-CM | POA: Diagnosis not present

## 2020-11-12 DIAGNOSIS — E782 Mixed hyperlipidemia: Secondary | ICD-10-CM

## 2020-11-12 DIAGNOSIS — I4729 Other ventricular tachycardia: Secondary | ICD-10-CM

## 2020-11-12 DIAGNOSIS — Z720 Tobacco use: Secondary | ICD-10-CM

## 2020-11-12 DIAGNOSIS — I472 Ventricular tachycardia: Secondary | ICD-10-CM

## 2020-11-12 DIAGNOSIS — I255 Ischemic cardiomyopathy: Secondary | ICD-10-CM | POA: Diagnosis not present

## 2020-11-12 MED ORDER — TICAGRELOR 60 MG PO TABS: 60.0000 mg | ORAL_TABLET | Freq: Two times a day (BID) | ORAL | 3 refills | Status: DC

## 2020-11-12 NOTE — Patient Instructions (Addendum)
Medication Instructions:  Ok to stop brilinta when you run out  If you need a refill on your cardiac medications before your next appointment, please call your pharmacy.    Lab work: No new labs needed   If you have labs (blood work) drawn today and your tests are completely normal, you will receive your results only by: Marland Kitchen MyChart Message (if you have MyChart) OR . A paper copy in the mail If you have any lab test that is abnormal or we need to change your treatment, we will call you to review the results.   Testing/Procedures: No new testing needed   Follow-Up: At [facility], you and your health needs are our priority.  As part of our continuing mission to provide you with exceptional heart care, we have created designated Provider Care Teams.  These Care Teams include your primary Cardiologist (physician) and Advanced Practice Providers (APPs -  Physician Assistants and Nurse Practitioners) who all work together to provide you with the care you need, when you need it.  . You will need a follow up appointment in 12 months  . Providers on your designated Care Team:   . Nicolasa Ducking, NP . Eula Listen, PA-C . Marisue Ivan, PA-C  Any Other Special Instructions Will Be Listed Below (If Applicable).  COVID-19 Vaccine Information can be found at: [email or web address] For questions related to vaccine distribution or appointments, please email [email or web address] or call [phone].

## 2020-12-14 ENCOUNTER — Other Ambulatory Visit: Payer: Self-pay | Admitting: Cardiovascular Disease

## 2021-05-03 ENCOUNTER — Other Ambulatory Visit: Payer: Self-pay | Admitting: Family Medicine

## 2021-05-03 DIAGNOSIS — I255 Ischemic cardiomyopathy: Secondary | ICD-10-CM

## 2021-05-03 DIAGNOSIS — I252 Old myocardial infarction: Secondary | ICD-10-CM

## 2021-05-11 ENCOUNTER — Other Ambulatory Visit: Payer: Self-pay | Admitting: Family Medicine

## 2021-05-11 DIAGNOSIS — I252 Old myocardial infarction: Secondary | ICD-10-CM

## 2021-05-11 DIAGNOSIS — E782 Mixed hyperlipidemia: Secondary | ICD-10-CM

## 2021-05-20 ENCOUNTER — Other Ambulatory Visit: Payer: Self-pay | Admitting: Family Medicine

## 2021-05-20 DIAGNOSIS — I255 Ischemic cardiomyopathy: Secondary | ICD-10-CM

## 2021-05-20 DIAGNOSIS — I252 Old myocardial infarction: Secondary | ICD-10-CM

## 2021-06-01 ENCOUNTER — Other Ambulatory Visit: Payer: Self-pay | Admitting: Family Medicine

## 2021-06-01 DIAGNOSIS — I252 Old myocardial infarction: Secondary | ICD-10-CM

## 2021-06-01 DIAGNOSIS — I255 Ischemic cardiomyopathy: Secondary | ICD-10-CM

## 2021-09-04 DIAGNOSIS — D398 Neoplasm of uncertain behavior of other specified female genital organs: Secondary | ICD-10-CM | POA: Diagnosis not present

## 2021-09-04 DIAGNOSIS — L570 Actinic keratosis: Secondary | ICD-10-CM | POA: Diagnosis not present

## 2021-09-04 DIAGNOSIS — L900 Lichen sclerosus et atrophicus: Secondary | ICD-10-CM | POA: Diagnosis not present

## 2021-09-04 DIAGNOSIS — Z85828 Personal history of other malignant neoplasm of skin: Secondary | ICD-10-CM | POA: Diagnosis not present

## 2021-09-04 DIAGNOSIS — Z08 Encounter for follow-up examination after completed treatment for malignant neoplasm: Secondary | ICD-10-CM | POA: Diagnosis not present

## 2021-09-04 DIAGNOSIS — L249 Irritant contact dermatitis, unspecified cause: Secondary | ICD-10-CM | POA: Diagnosis not present

## 2021-09-04 DIAGNOSIS — X32XXXA Exposure to sunlight, initial encounter: Secondary | ICD-10-CM | POA: Diagnosis not present

## 2021-09-04 DIAGNOSIS — L988 Other specified disorders of the skin and subcutaneous tissue: Secondary | ICD-10-CM | POA: Diagnosis not present

## 2021-09-05 DIAGNOSIS — H2513 Age-related nuclear cataract, bilateral: Secondary | ICD-10-CM | POA: Diagnosis not present

## 2021-09-05 LAB — HM DIABETES EYE EXAM

## 2021-09-11 ENCOUNTER — Encounter: Payer: Self-pay | Admitting: Family Medicine

## 2021-09-20 ENCOUNTER — Other Ambulatory Visit: Payer: Self-pay | Admitting: Family Medicine

## 2021-09-20 NOTE — Telephone Encounter (Signed)
Lvm for pt to call the office to schedule a f/u appt. And also sent a mychart letter

## 2021-09-23 NOTE — Telephone Encounter (Signed)
Called patient and left vm.

## 2021-10-12 ENCOUNTER — Other Ambulatory Visit: Payer: Self-pay | Admitting: Family Medicine

## 2021-10-24 ENCOUNTER — Telehealth: Payer: Self-pay | Admitting: Family Medicine

## 2021-10-24 DIAGNOSIS — I252 Old myocardial infarction: Secondary | ICD-10-CM

## 2021-10-24 DIAGNOSIS — E782 Mixed hyperlipidemia: Secondary | ICD-10-CM

## 2021-10-24 MED ORDER — ATORVASTATIN CALCIUM 20 MG PO TABS: 20.0000 mg | ORAL_TABLET | Freq: Every day | ORAL | 0 refills | Status: DC

## 2021-10-24 NOTE — Telephone Encounter (Signed)
Pt called in stated she needs  all there RX refill Encourage patient to contact the pharmacy for refills or they can request refills through [place]:  Please schedule appointment if longer than 1 year  NEXT APPOINTMENT DATE:  MEDICATION:aspirin 81 MG EC tablet  atorvastatin (LIPITOR) 20 MG tablet  lisinopril (ZESTRIL) 5 MG tablet metoprolol succinate (TOPROL-XL) 25 MG 24 hr tablet   Is the patient out of medication?   PHARMACY:[id number] [address], [place]   Let patient know to contact pharmacy at the end of the day to make sure medication is ready.  Please notify patient to allow 48-72 hours to process  CLINICAL FILLS OUT ALL BELOW:   LAST REFILL:  QTY:  REFILL DATE:    OTHER COMMENTS:    Okay for refill?  Please advise

## 2021-10-24 NOTE — Telephone Encounter (Signed)
Atorvastatin is only Rx without refills. 90 days sent in to get pt to next appt.  All other Rx's requested have refills on file at pharmacy.  Pt called and I left a VM requesting a call back to notify.Karen Hartman

## 2021-10-24 NOTE — Telephone Encounter (Signed)
Spoke to pt and made her aware.

## 2021-11-11 ENCOUNTER — Other Ambulatory Visit: Payer: Self-pay | Admitting: Family Medicine

## 2021-11-13 NOTE — Telephone Encounter (Signed)
°  Encourage patient to contact the pharmacy for refills or they can request refills through [place]:  Please schedule appointment if longer than 1 year  NEXT APPOINTMENT DATE: 01/02/22  MEDICATION: metformin - 90 day supply  Is the patient out of medication? yes  [facility] [address]  Let patient know to contact pharmacy at the end of the day to make sure medication is ready.  Please notify patient to allow 48-72 hours to process  CLINICAL FILLS OUT ALL BELOW:   LAST REFILL:  QTY:  REFILL DATE:    OTHER COMMENTS:    Okay for refill?  Please advise

## 2021-11-24 NOTE — Progress Notes (Signed)
Cardiology Office Note  Date:  11/25/2021   ID:  Karen Hartman, Karen Hartman 08-04-1956, MRN [id number]  PCP:  Lesleigh Noe, MD   Chief Complaint  Patient presents with   12 month follow up     "Doing well." Medications reviewed by the patient verbally.     HPI:  Karen Hartman is a 66 y.o. 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   treated with Aggrestat and taken back to the cath lab urgently which showed acute stent thrombosis which was treated successfully with aspiration thrombectomy, angiojet thrombectomy, and PTCA reloaded with an additional 180mg  of Brilinta, continued on Aggrestat for 18 hours. felt this event possibly happened due to her delay in digesting Brilinta.   troponin peaked at 29.   2D echo 11/02/16 showed EF 30-35%, grade 1 DD, mild MR, moderately dilated RV, moderate RV dysfunction, akinesis of the basal and mid inferoseptal,  inferior, inferolateral walls, and apical inferior and septal walls.     runs of NSVT as well as Torsades (18 seconds) on telemetry.    treated with heparin, amiodarone and lidocaine.   LifeVest was recommended with repeat echo 30 days - if EF >35 and no shocks, could discontinue Lifevest at that time.    PMH:   has a past medical history of Acute ST elevation myocardial infarction (STEMI) ([place]) (11/01/2016), CAD in native artery, Diabetes ([place]), HPV (human papilloma virus) infection (10/20/2017), Hyperlipidemia, mixed (11/01/2016), Ischemic cardiomyopathy, Morbid obesity ([place]), NSVT (nonsustained ventricular tachycardia), NSVT (nonsustained ventricular tachycardia) (11/05/2016), STEMI involving right coronary artery ([place]) (11/01/2016), Tobacco abuse, Torsades de pointes, Torsades de pointes, and Vulvar cancer ([place]).  PSH:    Past Surgical History:  Procedure Laterality Date   CARDIAC CATHETERIZATION N/A 11/01/2016   Procedure: Left Heart Cath and Coronary Angiography;  Surgeon: Sherren Mocha, MD;  Location: [place] [facility];  Service: Cardiovascular;  Laterality: N/A;   CARDIAC CATHETERIZATION N/A 11/01/2016   Procedure: Coronary Stent Intervention;  Surgeon: Sherren Mocha, MD;  Location: [place] [facility];  Service: Cardiovascular;  Laterality: N/A;  DES to Prox RCA- Promus 3.5x28   CESAREAN SECTION     CORONARY THROMBECTOMY N/A 11/01/2016   Procedure: Coronary Thrombectomy;  Surgeon: Sherren Mocha, MD;  Location: [place] [facility];  Service: Cardiovascular;  Laterality: N/A;   INTRAVASCULAR ULTRASOUND/IVUS N/A 11/01/2016   Procedure: Intravascular Ultrasound/IVUS;  Surgeon: Sherren Mocha, MD;  Location: [place] [facility];  Service: Cardiovascular;  Laterality: N/A;   LAMINECTOMY  1986  LEFT HEART CATH AND CORONARY ANGIOGRAPHY N/A 11/01/2016   Procedure: Left Heart Cath and Coronary Angiography;  Surgeon: Sherren Mocha, MD;  Location: [place] [facility];  Service: Cardiovascular;  Laterality: N/A;   TEMPORARY PACEMAKER N/A 11/01/2016   Procedure:  Temporary Pacemaker;  Surgeon: Sherren Mocha, MD;  Location: [place] [facility];  Service: Cardiovascular;  Laterality: N/A;   TONSILLECTOMY  1981   VULVECTOMY Right 2016   WISDOM TOOTH EXTRACTION      Current Outpatient Medications  Medication Sig Dispense Refill   acetaminophen (TYLENOL) 500 MG tablet Take 500 mg by mouth every 6 (six) hours as needed for mild pain.     aspirin 81 MG EC tablet TAKE 1 TABLET (81 MG TOTAL) BY MOUTH DAILY. SWALLOW WHOLE. 90 tablet 3   atorvastatin (LIPITOR) 20 MG tablet Take 1 tablet (20 mg total) by mouth daily. 90 tablet 0   calcium carbonate (TUMS - DOSED IN MG ELEMENTAL CALCIUM) 500 MG chewable tablet Chew 1 tablet by mouth daily as needed for indigestion or heartburn.     clobetasol ointment (TEMOVATE) 8.65 % Apply 1 application topically as needed.      docusate sodium (COLACE) 100 MG capsule Take 100 mg by mouth every other day.      lisinopril (ZESTRIL) 5 MG tablet TAKE 1 TABLET BY MOUTH EVERY DAY 90 tablet 3   metFORMIN (GLUCOPHAGE) 500 MG tablet TAKE 1 TABLET BY MOUTH TWICE A DAY 60 tablet 1   metoprolol succinate (TOPROL-XL) 25 MG 24 hr tablet TAKE 1 TABLET BY MOUTH EVERY DAY 90 tablet 3   nitroGLYCERIN (NITROSTAT) 0.4 MG SL tablet Place 1 tablet (0.4 mg total) under the tongue every 5 (five) minutes as needed for chest pain (up to 3 doses). 25 tablet 3   No current facility-administered medications for this visit.     Allergies:   Patient has no known allergies.   Social History:  The patient  reports that she quit smoking about 6 years ago. Her smoking use included cigarettes. She has a 20.00 pack-year smoking history. She has never used smokeless tobacco. She reports that she does not drink alcohol and does not use drugs.   Family History:   family history includes AAA (abdominal aortic aneurysm) in her father; Heart disease in her mother; Uterine cancer in her mother.    Review of Systems: Review of Systems  Constitutional:  Positive  for weight loss.  Respiratory: Negative.    Cardiovascular: Negative.   Gastrointestinal: Negative.   Musculoskeletal: Negative.   Neurological: Negative.   Psychiatric/Behavioral: Negative.    All other systems reviewed and are negative.  PHYSICAL EXAM: VS:  BP 112/60 (BP Location: Left Arm, Patient Position: Sitting, Cuff Size: Normal)    Pulse (!) 58    Ht 5\' 5"  (1.651 m)    Wt 215 lb 2 oz (97.6 kg)    SpO2 97%    BMI 35.80 kg/m  , BMI Body mass index is 35.8 kg/m. Constitutional:  oriented to person, place, and time. No distress.  HENT:  Head: Grossly normal Eyes:  no discharge. No scleral icterus.  Neck: No JVD, no carotid bruits  Cardiovascular: Regular rate and rhythm, no murmurs appreciated Pulmonary/Chest: Clear to auscultation bilaterally, no wheezes or rails Abdominal: Soft.  no distension.  no tenderness.  Musculoskeletal: Normal range of motion Neurological:  normal muscle tone. Coordination normal. No atrophy Skin: Skin warm and dry Psychiatric: normal affect, pleasant   Recent Labs: No results found  for requested labs within last 8760 hours.    Lipid Panel Lab Results  Component Value Date   CHOL 145 09/26/2020   HDL 63.90 09/26/2020   LDLCALC 72 09/26/2020   TRIG 46.0 09/26/2020      Wt Readings from Last 3 Encounters:  11/25/21 215 lb 2 oz (97.6 kg)  11/12/20 215 lb (97.5 kg)  09/26/20 203 lb 8 oz (92.3 kg)     ASSESSMENT AND PLAN:  Cardiomyopathy, ischemic -  ejection fraction  50-55% in January 2018 Euvolemic Extremity edema consistent with lymphedema, wears compression hose  NSVT (nonsustained ventricular tachycardia) ([place]) After COVID had some palpitations, these have resolved Continue current dose of metoprolol, sinus bradycardia For recurrent symptoms may need a ZIO monitor  Hyperlipidemia, mixed We have encouraged continued exercise, careful diet management in an effort to lose weight. We will add Zetia 10 mg daily to Lipitor  20  CAD in native artery Currently with no symptoms of angina. No further workup at this time. Continue current medication regimen.  Type 2 diabetes mellitus with other circulatory complication, without long-term current use of insulin (HCC) A1c  6.3 2021, walking and weight loss recommended    Total encounter time more than 25 minutes  Greater than 50% was spent in counseling and coordination of care with the patient    Orders Placed This Encounter  Procedures   EKG 12-Lead     Signed, Esmond Plants, M.D., Ph.D. 11/25/2021  [place], [place]

## 2021-11-25 ENCOUNTER — Encounter: Payer: Self-pay | Admitting: Cardiovascular Disease

## 2021-11-25 ENCOUNTER — Ambulatory Visit: Payer: BC Managed Care – PPO | Admitting: Cardiovascular Disease

## 2021-11-25 ENCOUNTER — Other Ambulatory Visit: Payer: Self-pay

## 2021-11-25 VITALS — BP 112/60 | HR 58 | Ht 65.0 in | Wt 215.1 lb

## 2021-11-25 DIAGNOSIS — I4729 Other ventricular tachycardia: Secondary | ICD-10-CM

## 2021-11-25 DIAGNOSIS — I252 Old myocardial infarction: Secondary | ICD-10-CM

## 2021-11-25 DIAGNOSIS — E1159 Type 2 diabetes mellitus with other circulatory complications: Secondary | ICD-10-CM | POA: Diagnosis not present

## 2021-11-25 DIAGNOSIS — I25118 Atherosclerotic heart disease of native coronary artery with other forms of angina pectoris: Secondary | ICD-10-CM

## 2021-11-25 DIAGNOSIS — Z720 Tobacco use: Secondary | ICD-10-CM

## 2021-11-25 DIAGNOSIS — I255 Ischemic cardiomyopathy: Secondary | ICD-10-CM | POA: Diagnosis not present

## 2021-11-25 DIAGNOSIS — E782 Mixed hyperlipidemia: Secondary | ICD-10-CM | POA: Diagnosis not present

## 2021-11-25 MED ORDER — EZETIMIBE 10 MG PO TABS: 10.0000 mg | ORAL_TABLET | Freq: Every day | ORAL | 3 refills | Status: DC

## 2021-11-25 NOTE — Patient Instructions (Addendum)
Medication Instructions:  Please start zetia 10 mg daily for cholesterol  If you need a refill on your cardiac medications before your next appointment, please call your pharmacy.   Lab work: No new labs needed  Testing/Procedures: No new testing needed  Follow-Up: At [facility], you and your health needs are our priority.  As part of our continuing mission to provide you with exceptional heart care, we have created designated Provider Care Teams.  These Care Teams include your primary Cardiologist (physician) and Advanced Practice Providers (APPs -  Physician Assistants and Nurse Practitioners) who all work together to provide you with the care you need, when you need it.  You will need a follow up appointment in 12 months  Providers on your designated Care Team:   Christopher Berge, NP Ryan Dunn, PA-C Cadence Furth, PA-C  COVID-19 Vaccine Information can be found at: [email or web address] For questions related to vaccine distribution or appointments, please email [email or web address] or call [phone].   

## 2021-12-07 ENCOUNTER — Other Ambulatory Visit: Payer: Self-pay | Admitting: Family Medicine

## 2022-01-02 ENCOUNTER — Ambulatory Visit (INDEPENDENT_AMBULATORY_CARE_PROVIDER_SITE_OTHER): Payer: BC Managed Care – PPO | Admitting: Family Medicine

## 2022-01-02 ENCOUNTER — Other Ambulatory Visit: Payer: Self-pay

## 2022-01-02 ENCOUNTER — Other Ambulatory Visit (HOSPITAL_COMMUNITY)
Admission: RE | Admit: 2022-01-02 | Discharge: 2022-01-02 | Disposition: A | Discharge status: Home / Self Care | Payer: BC Managed Care – PPO | Source: Ambulatory Visit | Attending: Family Medicine | Admitting: Family Medicine

## 2022-01-02 ENCOUNTER — Other Ambulatory Visit: Payer: Self-pay | Admitting: Family Medicine

## 2022-01-02 ENCOUNTER — Telehealth: Payer: Self-pay | Admitting: Family Medicine

## 2022-01-02 VITALS — BP 100/58 | HR 63 | Temp 98.0°F | Ht 64.75 in | Wt 210.3 lb

## 2022-01-02 DIAGNOSIS — Z0000 Encounter for general adult medical examination without abnormal findings: Secondary | ICD-10-CM

## 2022-01-02 DIAGNOSIS — E118 Type 2 diabetes mellitus with unspecified complications: Secondary | ICD-10-CM

## 2022-01-02 DIAGNOSIS — I252 Old myocardial infarction: Secondary | ICD-10-CM

## 2022-01-02 DIAGNOSIS — Z124 Encounter for screening for malignant neoplasm of cervix: Secondary | ICD-10-CM

## 2022-01-02 DIAGNOSIS — Z1231 Encounter for screening mammogram for malignant neoplasm of breast: Secondary | ICD-10-CM

## 2022-01-02 DIAGNOSIS — E782 Mixed hyperlipidemia: Secondary | ICD-10-CM | POA: Diagnosis not present

## 2022-01-02 DIAGNOSIS — I25118 Atherosclerotic heart disease of native coronary artery with other forms of angina pectoris: Secondary | ICD-10-CM

## 2022-01-02 DIAGNOSIS — Z87891 Personal history of nicotine dependence: Secondary | ICD-10-CM

## 2022-01-02 DIAGNOSIS — Z1211 Encounter for screening for malignant neoplasm of colon: Secondary | ICD-10-CM

## 2022-01-02 LAB — CBC
HCT: 43.8 % (ref 36.0–46.0)
Hemoglobin: 14.5 g/dL (ref 12.0–15.0)
MCHC: 33.2 g/dL (ref 30.0–36.0)
MCV: 89.5 fl (ref 78.0–100.0)
Platelets: 199 10*3/uL (ref 150.0–400.0)
RBC: 4.89 Mil/uL (ref 3.87–5.11)
RDW: 14.7 % (ref 11.5–15.5)
WBC: 5 10*3/uL (ref 4.0–10.5)

## 2022-01-02 LAB — COMPREHENSIVE METABOLIC PANEL
ALT: 30 U/L (ref 0–35)
AST: 37 U/L (ref 0–37)
Albumin: 4.3 g/dL (ref 3.5–5.2)
Alkaline Phosphatase: 59 U/L (ref 39–117)
BUN: 12 mg/dL (ref 6–23)
CO2: 25 mEq/L (ref 19–32)
Calcium: 9.7 mg/dL (ref 8.4–10.5)
Chloride: 104 mEq/L (ref 96–112)
Creatinine, Ser: 0.72 mg/dL (ref 0.40–1.20)
GFR: 88.17 mL/min (ref >60.00)
Glucose, Bld: 96 mg/dL (ref 70–99)
Potassium: 4.7 mEq/L (ref 3.5–5.1)
Sodium: 138 mEq/L (ref 135–145)
Total Bilirubin: 0.9 mg/dL (ref 0.2–1.2)
Total Protein: 6.6 g/dL (ref 6.0–8.3)

## 2022-01-02 LAB — LIPID PANEL
Cholesterol: 114 mg/dL (ref 0–200)
HDL: 57.6 mg/dL (ref >39.00)
LDL Cholesterol: 45 mg/dL (ref 0–99)
NonHDL: 56.27
Total CHOL/HDL Ratio: 2
Triglycerides: 56 mg/dL (ref 0.0–149.0)
VLDL: 11.2 mg/dL (ref 0.0–40.0)

## 2022-01-02 LAB — TSH: TSH: 1.6 u[IU]/mL (ref 0.35–5.50)

## 2022-01-02 LAB — HEMOGLOBIN A1C: Hgb A1c MFr Bld: 6.3 % (ref 4.6–6.5)

## 2022-01-02 MED ORDER — METFORMIN HCL 500 MG PO TABS: 500.0000 mg | ORAL_TABLET | Freq: Two times a day (BID) | ORAL | 3 refills | Status: DC

## 2022-01-02 MED ORDER — ATORVASTATIN CALCIUM 20 MG PO TABS: 20.0000 mg | ORAL_TABLET | Freq: Every day | ORAL | 3 refills | Status: DC

## 2022-01-02 NOTE — Assessment & Plan Note (Signed)
Continue metformin 500 mg twice daily.  Patient interested in weight loss advised checking with insurance on coverage for Ozempic or Trulicity.  If covered will likely stop metformin and start one of the injectable options.

## 2022-01-02 NOTE — Progress Notes (Signed)
Annual Exam   Chief Complaint:  Chief Complaint  Patient presents with   Annual Exam   Gynecologic Exam    History of Present Illness:  Ms. Karen Hartman is a 67 y.o. No obstetric history on file. who LMP was No LMP recorded. Patient is postmenopausal., presents today for her annual examination.    Weight gain  Nutrition She does get adequate calcium and Vitamin D in her diet. Diet: has some stress eating, hard time with moderation, but tries to eat healthy, late night snacking Exercise: walking daily  Safety The patient wears seatbelts: yes.     The patient feels safe at home and in their relationships: yes.  Dentist: yes Eye: yes - cataracts   Menstrual:  Symptoms of menopause: no issues  GYN She is single partner, contraception - post menopausal status.    Cervical Cancer Screening (21-65):   Last Pap:   November 2020 Results were: no abnormalities /neg HPV DNA   Breast Cancer Screening (Age 59-74):  There is no FH of breast cancer. There is no FH of ovarian cancer. BRCA screening Not Indicated.  Last Mammogram: 10/2020 The patient does want a mammogram this year.    Colon Cancer Screening:  Age 22-75 yo - benefits outweigh the risk. Adults 81-85 yo who have never been screened benefit.  Benefits: 134000 people in 2016 will be diagnosed and 49,000 will die - early detection helps [place]: Complications 2/2 to colonoscopy High Risk (Colonoscopy): genetic disorder (Lynch syndrome or familial adenomatous polyposis), personal hx of IBD, previous adenomatous polyp, or previous colorectal cancer, FamHx start 10 years before the age at diagnosis, increased in males and black race  Options:  FIT - looks for hemoglobin (blood in the stool) - specific and fairly sensitive - must be done annually Cologuard - looks for DNA and blood - more sensitive - therefore can have more false positives, every 3 years Colonoscopy - every 10 years if normal - sedation, bowl prep, must  have someone drive you  Shared decision making and the patient had decided to do cologuard.    Social History   Tobacco Use  Smoking Status Former   Packs/day: 0.50   Years: 40.00   Pack years: 20.00   Types: Cigarettes   Quit date: 2017   Years since quitting: 6.1  Smokeless Tobacco Never    Lung Cancer Screening (Ages 68-80): yes 20 year pack history? Yes Current Tobacco user? No Quit less than 15 years ago? Yes Interested in low dose CT for lung cancer screening? yes  Weight Wt Readings from Last 3 Encounters:  01/02/22 210 lb 5 oz (95.4 kg)  11/25/21 215 lb 2 oz (97.6 kg)  11/12/20 215 lb (97.5 kg)   Patient has high BMI  BMI Readings from Last 1 Encounters:  01/02/22 35.27 kg/m     Chronic disease screening Blood pressure monitoring:  BP Readings from Last 3 Encounters:  01/02/22 (!) 100/58  11/25/21 112/60  11/12/20 110/66    Lipid Monitoring: Indication for screening: age >59, obesity, diabetes, family hx, CV risk factors.  Lipid screening: Yes  Lab Results  Component Value Date   CHOL 145 09/26/2020   HDL 63.90 09/26/2020   LDLCALC 72 09/26/2020   TRIG 46.0 09/26/2020   CHOLHDL 2 09/26/2020     Diabetes Screening: age >37, overweight, family hx, PCOS, hx of gestational diabetes, at risk ethnicity Diabetes Screening screening: Yes  Lab Results  Component Value Date   HGBA1C 6.0 09/26/2020  Past Medical History:  Diagnosis Date   Acute ST elevation myocardial infarction (STEMI) ([place]) 11/01/2016   CAD in native artery    a. inf/lat STEMI 10/2016 with occluded RCA s/p DES, moderate nonobstructive LAD stenosis and mild nonobstructive LCx stenosis, EF 50-55% -> taken back to cath lab for acute stent thrombosis s/p thromectomy/PTCA of RCA same day. EF 30-35% by echo 11/02/16.    Diabetes ([place])    HPV (human papilloma virus) infection 10/20/2017   Hyperlipidemia, mixed 11/01/2016   Ischemic cardiomyopathy    Morbid obesity ([place])    NSVT  (nonsustained ventricular tachycardia)    a. during adm 10/2016 for STEMI.   NSVT (nonsustained ventricular tachycardia) 11/05/2016   STEMI involving right coronary artery ([place]) 11/01/2016   Tobacco abuse    Torsades de pointes    a. during adm 10/2016 for STEMI, felt infarct related.   Torsades de pointes    Vulvar cancer [facility])     Past Surgical History:  Procedure Laterality Date   CARDIAC CATHETERIZATION N/A 11/01/2016   Procedure: Left Heart Cath and Coronary Angiography;  Surgeon: Sherren Mocha, MD;  Location: [place] [facility];  Service: Cardiovascular;  Laterality: N/A;   CARDIAC CATHETERIZATION N/A 11/01/2016   Procedure: Coronary Stent Intervention;  Surgeon: Sherren Mocha, MD;  Location: [place] [facility];  Service: Cardiovascular;  Laterality: N/A;  DES to Prox RCA- Promus 3.5x28   CESAREAN SECTION     CORONARY THROMBECTOMY N/A 11/01/2016   Procedure: Coronary Thrombectomy;  Surgeon: Sherren Mocha, MD;  Location: [place] [facility];  Service: Cardiovascular;  Laterality: N/A;   INTRAVASCULAR ULTRASOUND/IVUS N/A 11/01/2016   Procedure: Intravascular Ultrasound/IVUS;  Surgeon: Sherren Mocha, MD;  Location: [place] [facility];  Service: Cardiovascular;  Laterality: N/A;   LAMINECTOMY  1986   LEFT HEART CATH AND CORONARY ANGIOGRAPHY N/A 11/01/2016   Procedure: Left Heart Cath and Coronary Angiography;  Surgeon: Sherren Mocha, MD;  Location: [place] [facility];  Service: Cardiovascular;  Laterality: N/A;   TEMPORARY PACEMAKER N/A 11/01/2016   Procedure: Temporary Pacemaker;  Surgeon: Sherren Mocha, MD;  Location: [place] [facility];  Service: Cardiovascular;  Laterality: N/A;   TONSILLECTOMY  1981   VULVECTOMY Right 2016   WISDOM TOOTH EXTRACTION      Prior to Admission medications   Medication Sig Start Date End Date Taking? Authorizing Provider  acetaminophen (TYLENOL) 500 MG tablet Take 500 mg by mouth every 6 (six) hours as needed for mild pain.   Yes [provider]  aspirin 81 MG EC tablet TAKE 1 TABLET (81 MG TOTAL) BY MOUTH DAILY. SWALLOW WHOLE. 05/20/21  Yes Lesleigh Noe, MD  atorvastatin (LIPITOR) 20 MG tablet Take 1 tablet (20 mg total) by mouth daily. 10/24/21  Yes Lesleigh Noe, MD  calcium carbonate (TUMS - DOSED IN MG ELEMENTAL CALCIUM) 500 MG chewable tablet Chew 1 tablet by mouth daily as needed for indigestion or heartburn.   Yes [provider]  clobetasol ointment (TEMOVATE) 4.91 % Apply 1 application topically as needed.  09/01/19  Yes [provider]  docusate sodium (COLACE) 100 MG capsule Take 100 mg by mouth every other day.    Yes [provider]  ezetimibe (ZETIA) 10 MG tablet Take 1 tablet (10 mg total) by mouth daily. 11/25/21  Yes Minna Merritts, MD  lisinopril (ZESTRIL) 5 MG tablet TAKE 1 TABLET BY MOUTH EVERY DAY 05/03/21  Yes Lesleigh Noe, MD  metFORMIN (GLUCOPHAGE) 500 MG tablet  TAKE 1 TABLET BY MOUTH TWICE A DAY 11/13/21  Yes Lesleigh Noe, MD  metoprolol succinate (TOPROL-XL) 25 MG 24 hr tablet TAKE 1 TABLET BY MOUTH EVERY DAY 06/03/21  Yes Lesleigh Noe, MD  nitroGLYCERIN (NITROSTAT) 0.4 MG SL tablet Place 1 tablet (0.4 mg total) under the tongue every 5 (five) minutes as needed for chest pain (up to 3 doses). 11/05/16  Yes Dunn, Dayna N, PA-C    No Known Allergies  Gynecologic History: No LMP recorded. Patient is postmenopausal.  Obstetric History: No obstetric history on file.  Social History   Socioeconomic History   Marital status: Married    Spouse name: Legrand Como   Number of children: 1   Years of education: BS college   Highest education level: Not on file  Occupational History   Not on file  Tobacco Use   Smoking status: Former    Packs/day: 0.50    Years: 40.00    Pack years: 20.00    Types: Cigarettes    Quit date: 2017    Years since quitting: 6.1   Smokeless tobacco: Never  Vaping Use   Vaping Use: Never used  Substance and Sexual Activity    Alcohol use: No   Drug use: No   Sexual activity: Yes    Birth control/protection: Post-menopausal  Other Topics Concern   Not on file  Social History Narrative   05/02/20   From: the area   Living: with Legrand Como, husband since 1982   Work: [employer], Soil scientist      Family: Son - Charles, no grandchildren - he is nearby, good relationship      Enjoys: yardwork, Chemical engineer, baking      Exercise: walking the dog daily, slow pace   Diet: diabetic diet      Safety   Seat belts: Yes    Guns: Yes  and secure   Safe in relationships: Yes    Social Determinants of Radio broadcast assistant Strain: Not on file  Food Insecurity: Not on file  Transportation Needs: Not on file  Physical Activity: Not on file  Stress: Not on file  Social Connections: Not on file  Intimate Partner Violence: Not on file    Family History  Problem Relation Age of Onset   Uterine cancer Mother    Heart disease Mother    AAA (abdominal aortic aneurysm) Father    Breast cancer Neg Hx     Review of Systems  Constitutional:  Negative for chills and fever.  HENT:  Negative for congestion and sore throat.   Eyes:  Negative for blurred vision and double vision.  Respiratory:  Negative for shortness of breath.   Cardiovascular:  Negative for chest pain.  Gastrointestinal:  Negative for heartburn, nausea and vomiting.  Genitourinary: Negative.   Musculoskeletal: Negative.  Negative for myalgias.  Skin:  Negative for rash.  Neurological:  Negative for dizziness and headaches.  Endo/Heme/Allergies:  Does not bruise/bleed easily.  Psychiatric/Behavioral:  Negative for depression. The patient is not nervous/anxious.     Physical Exam BP (!) 100/58    Pulse 63    Temp 98 F (36.7 C) (Oral)    Ht 5' 4.75" (1.645 m)    Wt 210 lb 5 oz (95.4 kg)    SpO2 98%    BMI 35.27 kg/m    BP Readings from Last 3 Encounters:  01/02/22 (!) 100/58  11/25/21 112/60  11/12/20 110/66      Physical  Exam Exam conducted with a chaperone present.  Constitutional:      General: She is not in acute distress.    Appearance: She is well-developed. She is not diaphoretic.  HENT:     Head: Normocephalic and atraumatic.     Right Ear: External ear normal.     Left Ear: External ear normal.     Nose: Nose normal.  Eyes:     General: No scleral icterus.    Extraocular Movements: Extraocular movements intact.     Conjunctiva/sclera: Conjunctivae normal.  Cardiovascular:     Rate and Rhythm: Normal rate and regular rhythm.     Heart sounds: No murmur heard. Pulmonary:     Effort: Pulmonary effort is normal. No respiratory distress.     Breath sounds: Normal breath sounds. No wheezing.  Abdominal:     General: Bowel sounds are normal. There is no distension.     Palpations: Abdomen is soft. There is no mass.     Tenderness: There is no abdominal tenderness. There is no guarding or rebound.  Genitourinary:    Vagina: Normal.     Cervix: Normal.  Musculoskeletal:        General: Normal range of motion.     Cervical back: Neck supple.  Lymphadenopathy:     Cervical: No cervical adenopathy.  Skin:    General: Skin is warm and dry.     Capillary Refill: Capillary refill takes less than 2 seconds.  Neurological:     Mental Status: She is alert and oriented to person, place, and time.     Deep Tendon Reflexes: Reflexes normal.  Psychiatric:        Mood and Affect: Mood normal.        Behavior: Behavior normal.    Results:  PHQ-9:  [place] Office Visit from 09/19/2019 in [facility]  PHQ-9 Total Score 2         Assessment: 66 y.o. No obstetric history on file. female here for routine annual physical examination.  Plan: Problem List Items Addressed This Visit       Cardiovascular and Mediastinum   Atherosclerosis of native coronary artery of native heart with stable angina pectoris (HCC)   Relevant Medications   atorvastatin (LIPITOR) 20 MG  tablet     Endocrine   Controlled diabetes mellitus type 2 with complications ([place])    Continue metformin 500 mg twice daily.  Patient interested in weight loss advised checking with insurance on coverage for Ozempic or Trulicity.  If covered will likely stop metformin and start one of the injectable options.      Relevant Medications   metFORMIN (GLUCOPHAGE) 500 MG tablet   atorvastatin (LIPITOR) 20 MG tablet   Other Relevant Orders   Comprehensive metabolic panel   Hemoglobin A1c     Other   Hyperlipidemia, mixed   Relevant Medications   atorvastatin (LIPITOR) 20 MG tablet   Other Relevant Orders   Lipid panel   History of heart attack   Relevant Medications   atorvastatin (LIPITOR) 20 MG tablet   Other Relevant Orders   Comprehensive metabolic panel   CBC   Other Visit Diagnoses     Annual physical exam    -  Primary   Screening for colon cancer       Relevant Orders   Cologuard   History of tobacco use       Relevant Orders   Ambulatory Referral Lung Cancer Screening [facility]  Morbid obesity ([place])       Relevant Medications   metFORMIN (GLUCOPHAGE) 500 MG tablet   Other Relevant Orders   TSH   CBC   Screening for cervical cancer       Relevant Orders   Cytology - PAP       Screening: -- Blood pressure screen normal -- cholesterol screening: will obtain -- Weight screening: obese: discussed management options, including lifestyle, dietary, and exercise. -- Diabetes Screening: will obtain -- Nutrition: Encouraged healthy diet  The ASCVD Risk score (Arnett DK, et al., 2019) failed to calculate for the following reasons:   The patient has a prior MI or stroke diagnosis  -- Statin therapy for Age 59-75 with CVD risk >7.5%  Psych -- Depression screening (PHQ-9):  [place] Visit from 09/19/2019 in [facility]  PHQ-9 Total Score 2        Safety -- tobacco screening: not using -- alcohol screening:  low-risk  usage. -- no evidence of domestic violence or intimate partner violence.   Cancer Screening -- pap smear collected per ASCCP guidelines -- family history of breast cancer screening: done. not at high risk. -- Mammogram -  she will schedule -- Colon cancer (age 56+)-- ordered  Immunizations Immunization History  Administered Date(s) Administered   Influenza,inj,Quad PF,6+ Mos 08/16/2020, 08/20/2021   Influenza-Unspecified 08/21/2016   PFIZER(Purple Top)SARS-COV-2 Vaccination 11/01/2019, 11/22/2019, 08/31/2020, 04/12/2021   Pfizer Covid-19 Vaccine Bivalent Booster 43yr & up 08/20/2021   Pneumococcal Polysaccharide-23 03/16/2017   Tdap 08/24/2017    -- flu vaccine up to date -- TDAP q10 years up to date -- Shingles (age >>59 unknown, record requested -- PPSV-23 (19-64 with chronic disease or smoking) up to date  -- Covid-19 Vaccine up to date   Encouraged healthy diet and exercise. Encouraged regular vision and dental care.    JLesleigh Noe MD

## 2022-01-02 NOTE — Telephone Encounter (Signed)
Mrs. Karen Hartman called in and stated that she was suppose to call back to let Dr. Einar Pheasant know what medicine is covered, she stated that [facility] [address] has the ozempic and all 3 strength and they need a pa

## 2022-01-02 NOTE — Patient Instructions (Signed)
Weight loss - Diabetes meds - Ozempic - Mounjaro - Trulicity (less weight loss)

## 2022-01-03 LAB — CYTOLOGY - PAP
Comment: NEGATIVE
Diagnosis: NEGATIVE
High risk HPV: NEGATIVE

## 2022-01-03 MED ORDER — OZEMPIC (0.25 OR 0.5 MG/DOSE) 2 MG/1.5ML ~~LOC~~ SOPN
0.2500 mg | PEN_INJECTOR | SUBCUTANEOUS | 1 refills | Status: DC
Start: 2022-01-03 — End: 2022-03-13

## 2022-01-03 NOTE — Addendum Note (Signed)
Addended by: Lesleigh Noe on: 01/03/2022 08:05 AM   Modules accepted: Orders

## 2022-01-03 NOTE — Telephone Encounter (Signed)
Spoke to pt and relayed message. Pt stated understanding.  

## 2022-01-03 NOTE — Telephone Encounter (Signed)
Please let patient know that I have sent Ozempic to her pharmacy.  She will start with 0.25 mg and update in approximately 1 month let me know if she tolerates it and would like to increase her dose.   At this point I would recommend continuing metformin until we know she is tolerating Ozempic at which point we can discussed stopping in 1 to 2 months

## 2022-01-16 ENCOUNTER — Telehealth: Payer: Self-pay

## 2022-01-16 DIAGNOSIS — Z1211 Encounter for screening for malignant neoplasm of colon: Secondary | ICD-10-CM | POA: Diagnosis not present

## 2022-01-16 NOTE — Telephone Encounter (Signed)
PA for Ozempic completed via covermymeds and approved. ?Effective from 01/16/2022 through 01/15/2023. ? ?Pharmacy notified via fax. ?(Key: [id number]) ?

## 2022-01-23 ENCOUNTER — Encounter: Payer: Self-pay | Admitting: Family Medicine

## 2022-01-23 LAB — COLOGUARD: COLOGUARD: NEGATIVE

## 2022-02-10 ENCOUNTER — Ambulatory Visit
Admission: RE | Admit: 2022-02-10 | Discharge: 2022-02-10 | Disposition: A | Discharge status: Home / Self Care | Payer: BC Managed Care – PPO | Source: Ambulatory Visit | Attending: Family Medicine | Admitting: Family Medicine

## 2022-02-10 DIAGNOSIS — Z1231 Encounter for screening mammogram for malignant neoplasm of breast: Secondary | ICD-10-CM

## 2022-02-17 ENCOUNTER — Other Ambulatory Visit: Payer: Self-pay | Admitting: *Deleted

## 2022-02-17 DIAGNOSIS — Z122 Encounter for screening for malignant neoplasm of respiratory organs: Secondary | ICD-10-CM

## 2022-02-17 DIAGNOSIS — Z87891 Personal history of nicotine dependence: Secondary | ICD-10-CM

## 2022-02-18 ENCOUNTER — Encounter: Payer: Self-pay | Admitting: Family Medicine

## 2022-03-13 ENCOUNTER — Other Ambulatory Visit: Payer: Self-pay | Admitting: Primary Care

## 2022-03-13 DIAGNOSIS — E118 Type 2 diabetes mellitus with unspecified complications: Secondary | ICD-10-CM

## 2022-03-13 MED ORDER — OZEMPIC (0.25 OR 0.5 MG/DOSE) 2 MG/1.5ML ~~LOC~~ SOPN: 0.5000 mg | PEN_INJECTOR | SUBCUTANEOUS | 0 refills | Status: DC

## 2022-03-18 ENCOUNTER — Ambulatory Visit (INDEPENDENT_AMBULATORY_CARE_PROVIDER_SITE_OTHER): Payer: BC Managed Care – PPO | Admitting: Acute Care

## 2022-03-18 ENCOUNTER — Encounter: Payer: Self-pay | Admitting: Acute Care

## 2022-03-18 DIAGNOSIS — Z87891 Personal history of nicotine dependence: Secondary | ICD-10-CM

## 2022-03-18 NOTE — Progress Notes (Signed)
Virtual Visit via Telephone Note ? ?I connected with Karen Hartman on 03/18/22 at  3:00 PM EDT by telephone and verified that I am speaking with the correct person using two identifiers. ? ?Location: ?Patient:  At home ?Provider:  [place], [place], [place], [address]  ?  ?I discussed the limitations, risks, security and privacy concerns of performing an evaluation and management service by telephone and the availability of in person appointments. I also discussed with the patient that there may be a patient responsible charge related to this service. The patient expressed understanding and agreed to proceed. ? ? ? ?Shared Decision Making Visit Lung Cancer Screening Program ?([phone]) ? ? ?Eligibility: ?Age 66 y.o. ?Pack Years Smoking History Calculation 30 pack year smoking history ?(# packs/per year x # years smoked) ?Recent History of coughing up blood  no ?Unexplained weight loss? no ?( >Than 15 pounds within the last 6 months ) ?Prior History Lung / other cancer no ?(Diagnosis within the last 5 years already requiring surveillance chest CT Scans). ?Smoking Status Former Smoker ?Former Smokers: Years since quit: 5 years ? Quit Date: 2018 ? ?Visit Components: ?Discussion included one or more decision making aids. yes ?Discussion included risk/benefits of screening. yes ?Discussion included potential follow up diagnostic testing for abnormal scans. yes ?Discussion included meaning and risk of over diagnosis. yes ?Discussion included meaning and risk of False Positives. yes ?Discussion included meaning of total radiation exposure. yes ? ?Counseling Included: ?Importance of adherence to annual lung cancer LDCT screening. yes ?Impact of comorbidities on ability to participate in the program. yes ?Ability and willingness to under diagnostic treatment. yes ? ?Smoking Cessation Counseling: ?Current Smokers:  ?Discussed importance of smoking cessation. yes ?Information about tobacco cessation classes and  interventions provided to patient. yes ?Patient provided with "ticket" for LDCT Scan. yes ?Symptomatic Patient. no ? Counseling NA ?Diagnosis Code: Tobacco Use Z72.0 ?Asymptomatic Patient yes ? Counseling (Intermediate counseling: > three minutes counseling) H0865 ?Former Smokers:  ?Discussed the importance of maintaining cigarette abstinence. yes ?Diagnosis Code: Personal History of Nicotine Dependence. H84.696 ?Information about tobacco cessation classes and interventions provided to patient. Yes ?Patient provided with "ticket" for LDCT Scan. yes ?Written Order for Lung Cancer Screening with LDCT placed in Epic. Yes ?(CT Chest Lung Cancer Screening Low Dose W/O CM) [id number] ?Z12.2-Screening of respiratory organs ?Z87.891-Personal history of nicotine dependence ? ?I spent 25 minutes of face to face time/virtual visit time  with  Karen Hartman discussing the risks and benefits of lung cancer screening. We took the time to pause the power point at intervals to allow for questions to be asked and answered to ensure understanding. We discussed that she had taken the single most powerful action possible to decrease her risk of developing lung cancer when she quit smoking. I counseled her to remain smoke free, and to contact me if she ever had the desire to smoke again so that I can provide resources and tools to help support the effort to remain smoke free. We discussed the time and location of the scan, and that either  Doroteo Glassman RN, Joella Prince, RN or I  or I will call / send a letter with the results within  24-72 hours of receiving them. She has the office contact information in the event she needs to speak with me,  she verbalized understanding of all of the above and had no further questions upon leaving the office.  ? ? ? ?I explained to the patient that  there has been a high incidence of coronary artery disease noted on these exams. I explained that this is a non-gated exam therefore degree or severity cannot  be determined. This patient is on statin therapy. I have asked the patient to follow-up with their PCP regarding any incidental finding of coronary artery disease and management with diet or medication as they feel is clinically indicated. The patient verbalized understanding of the above and had no further questions. ?  ? ? ?[name], NP ?03/18/2022 ? ? ? ? ? ? ?

## 2022-03-18 NOTE — Patient Instructions (Signed)

## 2022-03-19 ENCOUNTER — Other Ambulatory Visit: Payer: Self-pay

## 2022-03-19 ENCOUNTER — Ambulatory Visit
Admission: RE | Admit: 2022-03-19 | Discharge: 2022-03-19 | Disposition: A | Discharge status: Home / Self Care | Payer: BC Managed Care – PPO | Source: Ambulatory Visit | Attending: Acute Care | Admitting: Acute Care

## 2022-03-19 DIAGNOSIS — Z122 Encounter for screening for malignant neoplasm of respiratory organs: Secondary | ICD-10-CM | POA: Insufficient documentation

## 2022-03-19 DIAGNOSIS — Z87891 Personal history of nicotine dependence: Secondary | ICD-10-CM | POA: Insufficient documentation

## 2022-03-24 ENCOUNTER — Other Ambulatory Visit: Payer: Self-pay

## 2022-03-24 DIAGNOSIS — Z87891 Personal history of nicotine dependence: Secondary | ICD-10-CM

## 2022-03-24 DIAGNOSIS — Z122 Encounter for screening for malignant neoplasm of respiratory organs: Secondary | ICD-10-CM

## 2022-04-02 DIAGNOSIS — B078 Other viral warts: Secondary | ICD-10-CM | POA: Diagnosis not present

## 2022-04-02 DIAGNOSIS — Z85828 Personal history of other malignant neoplasm of skin: Secondary | ICD-10-CM | POA: Diagnosis not present

## 2022-04-02 DIAGNOSIS — R238 Other skin changes: Secondary | ICD-10-CM | POA: Diagnosis not present

## 2022-04-02 DIAGNOSIS — L900 Lichen sclerosus et atrophicus: Secondary | ICD-10-CM | POA: Diagnosis not present

## 2022-04-02 DIAGNOSIS — D2262 Melanocytic nevi of left upper limb, including shoulder: Secondary | ICD-10-CM | POA: Diagnosis not present

## 2022-04-02 DIAGNOSIS — L538 Other specified erythematous conditions: Secondary | ICD-10-CM | POA: Diagnosis not present

## 2022-04-02 DIAGNOSIS — D225 Melanocytic nevi of trunk: Secondary | ICD-10-CM | POA: Diagnosis not present

## 2022-04-11 ENCOUNTER — Other Ambulatory Visit: Payer: Self-pay | Admitting: Family Medicine

## 2022-04-11 DIAGNOSIS — I252 Old myocardial infarction: Secondary | ICD-10-CM

## 2022-04-11 DIAGNOSIS — I255 Ischemic cardiomyopathy: Secondary | ICD-10-CM

## 2022-04-25 ENCOUNTER — Encounter: Payer: Self-pay | Admitting: Family Medicine

## 2022-04-25 MED ORDER — SEMAGLUTIDE (1 MG/DOSE) 4 MG/3ML ~~LOC~~ SOPN: 1.0000 mg | PEN_INJECTOR | SUBCUTANEOUS | 0 refills | Status: DC

## 2022-06-03 ENCOUNTER — Other Ambulatory Visit: Payer: Self-pay | Admitting: Family Medicine

## 2022-06-04 MED ORDER — SEMAGLUTIDE (1 MG/DOSE) 4 MG/3ML ~~LOC~~ SOPN: 1.0000 mg | PEN_INJECTOR | SUBCUTANEOUS | 0 refills | Status: DC

## 2022-06-04 NOTE — Telephone Encounter (Signed)
Patient has been scheduled

## 2022-06-04 NOTE — Telephone Encounter (Signed)
Please have pt schedule a f/u weight check in the next 3-4 weeks prior to next refill

## 2022-07-02 ENCOUNTER — Ambulatory Visit: Payer: BC Managed Care – PPO | Admitting: Family Medicine

## 2022-07-02 VITALS — BP 90/50 | HR 75 | Temp 97.1°F | Ht 64.75 in | Wt 183.1 lb

## 2022-07-02 DIAGNOSIS — E669 Obesity, unspecified: Secondary | ICD-10-CM | POA: Diagnosis not present

## 2022-07-02 DIAGNOSIS — I252 Old myocardial infarction: Secondary | ICD-10-CM | POA: Diagnosis not present

## 2022-07-02 DIAGNOSIS — E118 Type 2 diabetes mellitus with unspecified complications: Secondary | ICD-10-CM

## 2022-07-02 DIAGNOSIS — Z23 Encounter for immunization: Secondary | ICD-10-CM | POA: Diagnosis not present

## 2022-07-02 DIAGNOSIS — E782 Mixed hyperlipidemia: Secondary | ICD-10-CM | POA: Diagnosis not present

## 2022-07-02 LAB — POCT GLYCOSYLATED HEMOGLOBIN (HGB A1C): Hemoglobin A1C: 5.3 % (ref 4.0–5.6)

## 2022-07-02 LAB — MICROALBUMIN / CREATININE URINE RATIO
Creatinine,U: 398.7 mg/dL
Microalb Creat Ratio: 1.1 mg/g (ref 0.0–30.0)
Microalb, Ur: 4.4 mg/dL — ABNORMAL HIGH (ref 0.0–1.9)

## 2022-07-02 MED ORDER — OZEMPIC (0.25 OR 0.5 MG/DOSE) 2 MG/3ML ~~LOC~~ SOPN: 0.5000 mg | PEN_INJECTOR | SUBCUTANEOUS | 0 refills | Status: DC

## 2022-07-02 NOTE — Assessment & Plan Note (Signed)
Lab Results  Component Value Date   HGBA1C 5.3 07/02/2022  Decrease Ozempic to 0.5 mg.  Diabetes is extremely well controlled in the setting of weight loss and 2 agents.  She is able to fill and tolerate Ozempic after 2 weeks she will stop metformin.  Return in 3 months for weight check and diabetes check.

## 2022-07-02 NOTE — Patient Instructions (Addendum)
Blood pressure - Try cutting lisinopril and metoprolol in half  - update if blood pressure elevated  Diabetes - reduce ozempic to 0.5 mg - if still side effects then decrease to 0.25 mg - if tolerating can stop metformin after 2 weeks

## 2022-07-02 NOTE — Assessment & Plan Note (Signed)
Continue atorvastatin 20 mg and Zetia 10 mg.

## 2022-07-02 NOTE — Assessment & Plan Note (Signed)
Blood pressure is low today, discussed decreasing lisinopril to 2.5 mg, decrease metoprolol to 12.5 mg.  She will try cutting her tablets, if unable to do so we will send in lower doses.  Update if her blood pressure increases.

## 2022-07-02 NOTE — Progress Notes (Signed)
Subjective:     Karen Hartman is a 66 y.o. female presenting for Follow-up (weight)     HPI  #Diabetes Currently taking ozempic  Using medications without difficulties: No - nausea and vomiting, if she eats an evening meal and go to bed symptoms are severe at night Hypoglycemic episodes:No  Hyperglycemic episodes:No  Feet problems:No   Last HgbA1c:  Lab Results  Component Value Date   HGBA1C 5.3 07/02/2022    Diabetes Health Maintenance Due:    Diabetes Health Maintenance Due  Topic Date Due   OPHTHALMOLOGY EXAM  09/05/2022   HEMOGLOBIN A1C  01/02/2023   FOOT EXAM  07/03/2023   #HTN - hx of heart attack - generally no lightheadedness unless outside doing yardwork for a long time    Review of Systems  04/25/2022 : mychart - increased ozempic to 1 mg  Social History   Tobacco Use  Smoking Status Former   Packs/day: 0.75   Years: 40.00   Total pack years: 30.00   Types: Cigarettes   Quit date: 2017   Years since quitting: 6.6  Smokeless Tobacco Never        Objective:    BP Readings from Last 3 Encounters:  07/02/22 (!) 90/50  01/02/22 (!) 100/58  11/25/21 112/60   Wt Readings from Last 3 Encounters:  07/02/22 183 lb 2 oz (83.1 kg)  01/02/22 210 lb 5 oz (95.4 kg)  11/25/21 215 lb 2 oz (97.6 kg)    BP (!) 90/50   Pulse 75   Temp (!) 97.1 F (36.2 C) (Temporal)   Ht 5' 4.75" (1.645 m)   Wt 183 lb 2 oz (83.1 kg)   SpO2 98%   BMI 30.71 kg/m    Physical Exam Constitutional:      General: She is not in acute distress.    Appearance: She is well-developed. She is not diaphoretic.  HENT:     Right Ear: External ear normal.     Left Ear: External ear normal.     Nose: Nose normal.  Eyes:     Conjunctiva/sclera: Conjunctivae normal.  Cardiovascular:     Rate and Rhythm: Normal rate and regular rhythm.     Heart sounds: No murmur heard. Pulmonary:     Effort: Pulmonary effort is normal. No respiratory distress.     Breath sounds:  Normal breath sounds. No wheezing.  Musculoskeletal:     Cervical back: Neck supple.  Skin:    General: Skin is warm and dry.     Capillary Refill: Capillary refill takes less than 2 seconds.  Neurological:     Mental Status: She is alert. Mental status is at baseline.  Psychiatric:        Mood and Affect: Mood normal.        Behavior: Behavior normal.           Assessment & Plan:   Problem List Items Addressed This Visit       Endocrine   Controlled diabetes mellitus type 2 with complications ([place]) - Primary    Lab Results  Component Value Date   HGBA1C 5.3 07/02/2022  Decrease Ozempic to 0.5 mg.  Diabetes is extremely well controlled in the setting of weight loss and 2 agents.  She is able to fill and tolerate Ozempic after 2 weeks she will stop metformin.  Return in 3 months for weight check and diabetes check.       Relevant Medications   Semaglutide,0.25 or 0.'5MG'$ /DOS, ([place],  0.25 OR 0.5 MG/DOSE,) 2 MG/3ML SOPN   Other Relevant Orders   POCT glycosylated hemoglobin (Hb A1C) (Completed)   Microalbumin / creatinine urine ratio     Other   Hyperlipidemia, mixed    Continue atorvastatin 20 mg and Zetia 10 mg.      Relevant Medications   Semaglutide,0.25 or 0.'5MG'$ /DOS, (OZEMPIC, 0.25 OR 0.5 MG/DOSE,) 2 MG/3ML SOPN   History of heart attack    Blood pressure is low today, discussed decreasing lisinopril to 2.5 mg, decrease metoprolol to 12.5 mg.  She will try cutting her tablets, if unable to do so we will send in lower doses.  Update if her blood pressure increases.      Relevant Medications   Semaglutide,0.25 or 0.'5MG'$ /DOS, (OZEMPIC, 0.25 OR 0.5 MG/DOSE,) 2 MG/3ML SOPN   Obesity (BMI 30.0-34.9)    She has lost approximately 30 pounds on Ozempic, but having significant side effects of the 1 mg dose.  Increase to 0.5 mg to see if she tolerates this better.  Continue healthy diet and exercise.      Relevant Medications   Semaglutide,0.25 or 0.'5MG'$ /DOS, (OZEMPIC,  0.25 OR 0.5 MG/DOSE,) 2 MG/3ML SOPN   Other Visit Diagnoses     Need for vaccination against Streptococcus pneumoniae       Relevant Orders   Pneumococcal conjugate vaccine 20-valent (Completed)        Return in about 3 months (around 10/02/2022) for TOC with Tabitha for DM check and BP.  Lesleigh Noe, MD

## 2022-07-02 NOTE — Assessment & Plan Note (Signed)
She has lost approximately 30 pounds on Ozempic, but having significant side effects of the 1 mg dose.  Increase to 0.5 mg to see if she tolerates this better.  Continue healthy diet and exercise.

## 2022-09-10 DIAGNOSIS — D225 Melanocytic nevi of trunk: Secondary | ICD-10-CM | POA: Diagnosis not present

## 2022-09-10 DIAGNOSIS — L309 Dermatitis, unspecified: Secondary | ICD-10-CM | POA: Diagnosis not present

## 2022-09-10 DIAGNOSIS — X32XXXA Exposure to sunlight, initial encounter: Secondary | ICD-10-CM | POA: Diagnosis not present

## 2022-09-10 DIAGNOSIS — D485 Neoplasm of uncertain behavior of skin: Secondary | ICD-10-CM | POA: Diagnosis not present

## 2022-09-10 DIAGNOSIS — D2272 Melanocytic nevi of left lower limb, including hip: Secondary | ICD-10-CM | POA: Diagnosis not present

## 2022-09-10 DIAGNOSIS — D045 Carcinoma in situ of skin of trunk: Secondary | ICD-10-CM | POA: Diagnosis not present

## 2022-09-10 DIAGNOSIS — D2262 Melanocytic nevi of left upper limb, including shoulder: Secondary | ICD-10-CM | POA: Diagnosis not present

## 2022-09-10 DIAGNOSIS — L570 Actinic keratosis: Secondary | ICD-10-CM | POA: Diagnosis not present

## 2022-09-10 DIAGNOSIS — L820 Inflamed seborrheic keratosis: Secondary | ICD-10-CM | POA: Diagnosis not present

## 2022-09-10 DIAGNOSIS — D2261 Melanocytic nevi of right upper limb, including shoulder: Secondary | ICD-10-CM | POA: Diagnosis not present

## 2022-09-24 ENCOUNTER — Encounter: Payer: Self-pay | Admitting: Family

## 2022-09-24 ENCOUNTER — Ambulatory Visit: Payer: BC Managed Care – PPO | Admitting: Family

## 2022-09-24 VITALS — BP 100/62 | HR 76 | Temp 98.6°F | Resp 16 | Ht 64.75 in | Wt 179.1 lb

## 2022-09-24 DIAGNOSIS — M7989 Other specified soft tissue disorders: Secondary | ICD-10-CM

## 2022-09-24 DIAGNOSIS — E669 Obesity, unspecified: Secondary | ICD-10-CM | POA: Diagnosis not present

## 2022-09-24 DIAGNOSIS — Z87891 Personal history of nicotine dependence: Secondary | ICD-10-CM

## 2022-09-24 DIAGNOSIS — R809 Proteinuria, unspecified: Secondary | ICD-10-CM

## 2022-09-24 DIAGNOSIS — E118 Type 2 diabetes mellitus with unspecified complications: Secondary | ICD-10-CM | POA: Diagnosis not present

## 2022-09-24 DIAGNOSIS — I252 Old myocardial infarction: Secondary | ICD-10-CM | POA: Diagnosis not present

## 2022-09-24 DIAGNOSIS — E782 Mixed hyperlipidemia: Secondary | ICD-10-CM

## 2022-09-24 DIAGNOSIS — Z8544 Personal history of malignant neoplasm of other female genital organs: Secondary | ICD-10-CM

## 2022-09-24 LAB — POCT GLYCOSYLATED HEMOGLOBIN (HGB A1C): Hemoglobin A1C: 5.4 % (ref 4.0–5.6)

## 2022-09-24 MED ORDER — OZEMPIC (0.25 OR 0.5 MG/DOSE) 2 MG/3ML ~~LOC~~ SOPN: 0.5000 mg | PEN_INJECTOR | SUBCUTANEOUS | 1 refills | Status: DC

## 2022-09-24 NOTE — Assessment & Plan Note (Signed)
Chronic , stable Continue with daily compression hose

## 2022-09-24 NOTE — Patient Instructions (Signed)
  Welcome to our clinic, I am happy to have you as my new patient. I am excited to continue on this healthcare journey with you.  Stop by the lab prior to leaving today. I will notify you of your results once received.   Please keep in mind Any my chart messages you send have up to a three business day turnaround for a response.  Phone calls may take up to a one full business day turnaround for a  response.   If you need a medication refill I recommend you request it through the pharmacy as this is easiest for [place] rather than sending a message and or phone call.   Due to recent changes in healthcare laws, you may see results of your imaging and/or laboratory studies on MyChart before I have had a chance to review them.  I understand that in some cases there may be results that are confusing or concerning to you. Please understand that not all results are received at the same time and often I may need to interpret multiple results in order to provide you with the best plan of care or course of treatment. Therefore, I ask that you please give me 2 business days to thoroughly review all your results before contacting my office for clarification. Should we see a critical lab result, you will be contacted sooner.   It was a pleasure seeing you today! Please do not hesitate to reach out with any questions and or concerns.  Regards,   Eugenia Pancoast FNP-C

## 2022-09-24 NOTE — Progress Notes (Signed)
Established Patient Office Visit  Subjective:  Patient ID: Karen Hartman, female    DOB: 09-30-1956  Age: 66 y.o. MRN: [id number]  CC:  Chief Complaint  Patient presents with   Transitions Of Care    HPI Karen Hartman is here for a transition of care visit.  Prior provider was: Dr. Waunita Schooner, MD Pt is without acute concerns.   Pap 01/02/22, negative. H/o vulvar cancer. 2016, right vulvectomy.   chronic concerns:  H/o vulvar cancer: checks her every six months, recent biopsy last year on right perineum which was benign. Two weeks ago, squamous cell in situ biopsied and found on left perineum. Se is waiting consult with oncological surgeon, Dr. Kary Kos.   MI in 2017, stent was placed. Is taking metoprolol, baby asa and atorvastatin.   Dm2: stopped the metformin due to good control with A1C. She is eager to recheck today. She is tolerating ozempic well at 0.5 mg weekly. Zetia 10 mg   Lab Results  Component Value Date   HGBA1C 5.4 09/24/2022   HLD: atorvastatin 20 mg once daily, tolerating well. Denies myalgias.   Urine microalbumin: positive, taking lisinopril 5.   Lung cancer screening program, annually ct scan, first time was last year (03/19/22)  Past Medical History:  Diagnosis Date   Acute ST elevation myocardial infarction (STEMI) ([place]) 11/01/2016   CAD in native artery    a. inf/lat STEMI 10/2016 with occluded RCA s/p DES, moderate nonobstructive LAD stenosis and mild nonobstructive LCx stenosis, EF 50-55% -> taken back to cath lab for acute stent thrombosis s/p thromectomy/PTCA of RCA same day. EF 30-35% by echo 11/02/16.    Diabetes ([place])    HPV (human papilloma virus) infection 10/20/2017   Hyperlipidemia, mixed 11/01/2016   Ischemic cardiomyopathy    Morbid obesity ([place])    NSVT (nonsustained ventricular tachycardia) ([place])    a. during adm 10/2016 for STEMI.   NSVT (nonsustained ventricular tachycardia) ([place]) 11/05/2016   STEMI involving right  coronary artery ([place]) 11/01/2016   Tobacco abuse    Torsades de pointes ([place])    a. during adm 10/2016 for STEMI, felt infarct related.   Torsades de pointes ([place])    Vulvar cancer [facility])     Past Surgical History:  Procedure Laterality Date   CARDIAC CATHETERIZATION N/A 11/01/2016   Procedure: Left Heart Cath and Coronary Angiography;  Surgeon: Sherren Mocha, MD;  Location: [facility];  Service: Cardiovascular;  Laterality: N/A;   CARDIAC CATHETERIZATION N/A 11/01/2016   Procedure: Coronary Stent Intervention;  Surgeon: Sherren Mocha, MD;  Location: [facility];  Service: Cardiovascular;  Laterality: N/A;  DES to Prox RCA- [place] 3.5x28   CESAREAN SECTION     CORONARY THROMBECTOMY N/A 11/01/2016   Procedure: Coronary Thrombectomy;  Surgeon: Sherren Mocha, MD;  Location: [facility];  Service: Cardiovascular;  Laterality: N/A;   INTRAVASCULAR ULTRASOUND/IVUS N/A 11/01/2016   Procedure: Intravascular Ultrasound/IVUS;  Surgeon: Sherren Mocha, MD;  Location: [facility];  Service: Cardiovascular;  Laterality: N/A;   LAMINECTOMY  1986   LEFT HEART CATH AND CORONARY ANGIOGRAPHY N/A 11/01/2016   Procedure: Left Heart Cath and Coronary Angiography;  Surgeon: Sherren Mocha, MD;  Location: [facility];  Service: Cardiovascular;  Laterality: N/A;   TEMPORARY PACEMAKER N/A 11/01/2016   Procedure: Temporary Pacemaker;  Surgeon: Sherren Mocha, MD;  Location: [facility];  Service: Cardiovascular;  Laterality: N/A;   TONSILLECTOMY  1981  VULVECTOMY Right 2016   WISDOM TOOTH EXTRACTION      Family History  Problem Relation Age of Onset   Uterine cancer Mother    Heart disease Mother    AAA (abdominal aortic aneurysm) Father    Breast cancer Neg Hx     Social History   Socioeconomic History   Marital status: Married    Spouse name: Legrand Como   Number of children: 1   Years of education: BS college   Highest education level: Not on file   Occupational History    Employer: [employer]  Tobacco Use   Smoking status: Former    Packs/day: 0.75    Years: 40.00    Total pack years: 30.00    Types: Cigarettes    Quit date: 2017    Years since quitting: 6.8   Smokeless tobacco: Never  Vaping Use   Vaping Use: Never used  Substance and Sexual Activity   Alcohol use: No   Drug use: No   Sexual activity: Not Currently    Birth control/protection: Post-menopausal  Other Topics Concern   Not on file  Social History Narrative   05/02/20   From: the area   Living: with Legrand Como, husband since 1982   Work: [employer], Soil scientist      Family: Son - Charles, no grandchildren - he is nearby, good relationship      Enjoys: yardwork, Chemical engineer, baking      Exercise: walking the dog daily, slow pace   Diet: diabetic diet      Safety   Seat belts: Yes    Guns: Yes  and secure   Safe in relationships: Yes    Social Determinants of Radio broadcast assistant Strain: Not on file  Food Insecurity: Not on file  Transportation Needs: Not on file  Physical Activity: Not on file  Stress: Not on file  Social Connections: Not on file  Intimate Partner Violence: Not on file    Outpatient Medications Prior to Visit  Medication Sig Dispense Refill   atorvastatin (LIPITOR) 20 MG tablet Take 1 tablet (20 mg total) by mouth daily. 90 tablet 3   clobetasol ointment (TEMOVATE) 6.16 % Apply 1 application topically as needed.      [facility] ASPIRIN LOW DOSE 81 MG tablet TAKE 1 TABLET (81 MG TOTAL) BY MOUTH DAILY. SWALLOW WHOLE. 90 tablet 2   docusate sodium (COLACE) 100 MG capsule Take 100 mg by mouth daily.     ezetimibe (ZETIA) 10 MG tablet Take 1 tablet (10 mg total) by mouth daily. 90 tablet 3   lisinopril (ZESTRIL) 5 MG tablet TAKE 1 TABLET BY MOUTH EVERY DAY 90 tablet 2   metoprolol succinate (TOPROL-XL) 25 MG 24 hr tablet TAKE 1 TABLET BY MOUTH EVERY DAY 90 tablet 2   nitroGLYCERIN (NITROSTAT) 0.4 MG SL tablet  Place 1 tablet (0.4 mg total) under the tongue every 5 (five) minutes as needed for chest pain (up to 3 doses). 25 tablet 3   acetaminophen (TYLENOL) 500 MG tablet Take 500 mg by mouth every 6 (six) hours as needed for mild pain.     calcium carbonate (TUMS - DOSED IN MG ELEMENTAL CALCIUM) 500 MG chewable tablet Chew 1 tablet by mouth daily as needed for indigestion or heartburn.     Semaglutide,0.25 or 0.'5MG'$ /DOS, (OZEMPIC, 0.25 OR 0.5 MG/DOSE,) 2 MG/3ML SOPN Inject 0.5 mg into the skin once a week. 9 mL 0   metFORMIN (GLUCOPHAGE) 500  MG tablet Take 1 tablet (500 mg total) by mouth 2 (two) times daily. (Patient not taking: Reported on 09/24/2022) 180 tablet 3   No facility-administered medications prior to visit.    No Known Allergies      Objective:    Physical Exam  Gen: NAD, resting comfortably CV: RRR with no murmurs appreciated Pulm: NWOB, CTAB with no crackles, wheezes, or rhonchi Skin: warm, dry Psych: Normal affect and thought content  BP 100/62   Pulse 76   Temp 98.6 F (37 C)   Resp 16   Ht 5' 4.75" (1.645 m)   Wt 179 lb 2 oz (81.3 kg)   SpO2 100%   BMI 30.04 kg/m  Wt Readings from Last 3 Encounters:  09/24/22 179 lb 2 oz (81.3 kg)  07/02/22 183 lb 2 oz (83.1 kg)  01/02/22 210 lb 5 oz (95.4 kg)     Health Maintenance Due  Topic Date Due   Zoster Vaccines- Shingrix (1 of 2) Never done   DEXA SCAN  Never done   OPHTHALMOLOGY EXAM  09/05/2022    There are no preventive care reminders to display for this patient.  Lab Results  Component Value Date   TSH 1.60 01/02/2022   Lab Results  Component Value Date   WBC 5.0 01/02/2022   HGB 14.5 01/02/2022   HCT 43.8 01/02/2022   MCV 89.5 01/02/2022   PLT 199.0 01/02/2022   Lab Results  Component Value Date   NA 138 01/02/2022   K 4.7 01/02/2022   CO2 25 01/02/2022   GLUCOSE 96 01/02/2022   BUN 12 01/02/2022   CREATININE 0.72 01/02/2022   BILITOT 0.9 01/02/2022   ALKPHOS 59 01/02/2022   AST 37  01/02/2022   ALT 30 01/02/2022   PROT 6.6 01/02/2022   ALBUMIN 4.3 01/02/2022   CALCIUM 9.7 01/02/2022   ANIONGAP 7 12/05/2016   GFR 88.17 01/02/2022   Lab Results  Component Value Date   CHOL 114 01/02/2022   Lab Results  Component Value Date   HDL 57.60 01/02/2022   Lab Results  Component Value Date   LDLCALC 45 01/02/2022   Lab Results  Component Value Date   TRIG 56.0 01/02/2022   Lab Results  Component Value Date   CHOLHDL 2 01/02/2022   Lab Results  Component Value Date   HGBA1C 5.4 09/24/2022      Assessment & Plan:   Problem List Items Addressed This Visit       Endocrine   Controlled diabetes mellitus type 2 with complications ([place]) (Chronic)    Ordered hga1c today in office, improved, continue current dose and refill sent for ozempic 0.5 mg weekly. Work on diabetic diet and exercise as tolerated. Yearly foot exam, and annual eye exam.        Relevant Medications   Semaglutide,0.25 or 0.'5MG'$ /DOS, (OZEMPIC, 0.25 OR 0.5 MG/DOSE,) 2 MG/3ML SOPN   Other Relevant Orders   POCT glycosylated hemoglobin (Hb A1C) (Completed)     Other   Hyperlipidemia, mixed   Relevant Medications   Semaglutide,0.25 or 0.'5MG'$ /DOS, (OZEMPIC, 0.25 OR 0.5 MG/DOSE,) 2 MG/3ML SOPN   History of cancer of vulva    Continue f/u with and consult with oncologic surgeon and dermatology for treatment plan.       History of heart attack    Stable Continue asa 81 mg , metoprolol 25 mg and Lipitor 20 mg       Relevant Medications   Semaglutide,0.25 or 0.'5MG'$ /DOS, (OZEMPIC, 0.25  OR 0.5 MG/DOSE,) 2 MG/3ML SOPN   Left leg swelling - Primary    Chronic , stable Continue with daily compression hose      Obesity (BMI 30.0-34.9)   Relevant Medications   Semaglutide,0.25 or 0.'5MG'$ /DOS, (OZEMPIC, 0.25 OR 0.5 MG/DOSE,) 2 MG/3ML SOPN   Positive for microalbuminuria    Continue lisinopril 5 mg Handout given to pt on more information       History of tobacco use    Continue annual  screening with low dose ct scan, next due 03/20/23       Meds ordered this encounter  Medications   Semaglutide,0.25 or 0.'5MG'$ /DOS, (OZEMPIC, 0.25 OR 0.5 MG/DOSE,) 2 MG/3ML SOPN    Sig: Inject 0.5 mg into the skin once a week.    Dispense:  9 mL    Refill:  1    Follow-up: Return for f/u PAP.    Eugenia Pancoast, FNP

## 2022-09-24 NOTE — Assessment & Plan Note (Signed)
Continue lisinopril 5 mg Handout given to pt on more information

## 2022-09-24 NOTE — Assessment & Plan Note (Signed)
Continue f/u with and consult with oncologic surgeon and dermatology for treatment plan.

## 2022-09-24 NOTE — Assessment & Plan Note (Signed)
Ordered hga1c today in office, improved, continue current dose and refill sent for ozempic 0.5 mg weekly. Work on diabetic diet and exercise as tolerated. Yearly foot exam, and annual eye exam.

## 2022-09-24 NOTE — Assessment & Plan Note (Signed)
Continue annual screening with low dose ct scan, next due 03/20/23

## 2022-09-24 NOTE — Assessment & Plan Note (Signed)
Stable Continue asa 81 mg , metoprolol 25 mg and Lipitor 20 mg

## 2022-10-13 DIAGNOSIS — C519 Malignant neoplasm of vulva, unspecified: Secondary | ICD-10-CM | POA: Diagnosis not present

## 2022-10-13 DIAGNOSIS — B977 Papillomavirus as the cause of diseases classified elsewhere: Secondary | ICD-10-CM | POA: Diagnosis not present

## 2022-10-13 DIAGNOSIS — E119 Type 2 diabetes mellitus without complications: Secondary | ICD-10-CM | POA: Diagnosis not present

## 2022-10-13 DIAGNOSIS — N903 Dysplasia of vulva, unspecified: Secondary | ICD-10-CM | POA: Diagnosis not present

## 2022-10-13 DIAGNOSIS — Z9889 Other specified postprocedural states: Secondary | ICD-10-CM | POA: Diagnosis not present

## 2022-10-16 DIAGNOSIS — D071 Carcinoma in situ of vulva: Secondary | ICD-10-CM | POA: Diagnosis not present

## 2022-11-10 ENCOUNTER — Other Ambulatory Visit: Payer: Self-pay | Admitting: Cardiovascular Disease

## 2022-11-11 NOTE — Telephone Encounter (Signed)
Please schedule 12 month F/U appointment for 90 day refills. Thank you! 

## 2022-11-14 ENCOUNTER — Telehealth: Payer: Self-pay

## 2022-11-14 NOTE — Telephone Encounter (Signed)
Received Refill request for   Lisinopril '5mg'$  tab  Atorvastatin '20mg'$  tab Metoprolol '25mg'$  tab  LOV 09/24/22 NOV 01/14/23

## 2022-11-18 ENCOUNTER — Telehealth: Payer: Self-pay | Admitting: Family

## 2022-11-18 DIAGNOSIS — E782 Mixed hyperlipidemia: Secondary | ICD-10-CM

## 2022-11-18 DIAGNOSIS — I252 Old myocardial infarction: Secondary | ICD-10-CM

## 2022-11-18 DIAGNOSIS — I255 Ischemic cardiomyopathy: Secondary | ICD-10-CM

## 2022-11-18 MED ORDER — METOPROLOL SUCCINATE ER 25 MG PO TB24: 25.0000 mg | ORAL_TABLET | Freq: Every day | ORAL | 2 refills | Status: DC

## 2022-11-18 MED ORDER — ATORVASTATIN CALCIUM 20 MG PO TABS: 20.0000 mg | ORAL_TABLET | Freq: Every day | ORAL | 3 refills | Status: DC

## 2022-11-18 MED ORDER — LISINOPRIL 5 MG PO TABS: 5.0000 mg | ORAL_TABLET | Freq: Every day | ORAL | 2 refills | Status: DC

## 2022-11-18 NOTE — Telephone Encounter (Signed)
Refilled

## 2022-11-18 NOTE — Telephone Encounter (Signed)
Prescription Request  11/18/2022  Is this a "Controlled Substance" medicine? No  LOV: 09/24/2022  What is the name of the medication or equipment?  atorvastatin (LIPITOR) 20 MG tablet  lisinopril (ZESTRIL) 5 MG tablet  metoprolol succinate (TOPROL-XL) 25 MG 24 hr tablet       Have you contacted your pharmacy to request a refill? Yes   Pharmacy stated they've sent refill request over & haven't gotten a response.  Which pharmacy would you like this sent to?  [id number] [place]Phone: [phone]: [phone]   Patient notified that their request is being sent to the clinical staff for review and that they should receive a response within 2 business days.   Please advise at Mobile [phone]

## 2022-11-18 NOTE — Telephone Encounter (Signed)
Last office visit 09/24/22 Upcoming appointment 01/14/23

## 2022-11-25 DIAGNOSIS — I10 Essential (primary) hypertension: Secondary | ICD-10-CM | POA: Diagnosis not present

## 2022-11-25 DIAGNOSIS — D071 Carcinoma in situ of vulva: Secondary | ICD-10-CM | POA: Diagnosis not present

## 2022-11-25 DIAGNOSIS — E785 Hyperlipidemia, unspecified: Secondary | ICD-10-CM | POA: Diagnosis not present

## 2022-11-25 DIAGNOSIS — F17200 Nicotine dependence, unspecified, uncomplicated: Secondary | ICD-10-CM | POA: Diagnosis not present

## 2022-11-25 DIAGNOSIS — Z79899 Other long term (current) drug therapy: Secondary | ICD-10-CM | POA: Diagnosis not present

## 2022-11-25 DIAGNOSIS — Z7982 Long term (current) use of aspirin: Secondary | ICD-10-CM | POA: Diagnosis not present

## 2022-11-25 DIAGNOSIS — N903 Dysplasia of vulva, unspecified: Secondary | ICD-10-CM | POA: Diagnosis not present

## 2022-11-25 DIAGNOSIS — E119 Type 2 diabetes mellitus without complications: Secondary | ICD-10-CM | POA: Diagnosis not present

## 2022-11-25 HISTORY — PX: LASER ABLATION: SHX1947

## 2022-12-06 ENCOUNTER — Other Ambulatory Visit: Payer: Self-pay | Admitting: Cardiovascular Disease

## 2022-12-08 NOTE — Telephone Encounter (Signed)
Please contact pt for 12 month f/u. Pt needing refills. Pt due for f/u.

## 2022-12-09 NOTE — Telephone Encounter (Signed)
LVM

## 2022-12-10 NOTE — Telephone Encounter (Signed)
Pt is scheduled on 02/18/2023

## 2023-01-01 ENCOUNTER — Other Ambulatory Visit: Payer: Self-pay | Admitting: Cardiovascular Disease

## 2023-01-07 ENCOUNTER — Encounter: Payer: Self-pay | Admitting: Family

## 2023-01-07 ENCOUNTER — Other Ambulatory Visit: Payer: Self-pay

## 2023-01-07 DIAGNOSIS — I255 Ischemic cardiomyopathy: Secondary | ICD-10-CM

## 2023-01-07 DIAGNOSIS — I252 Old myocardial infarction: Secondary | ICD-10-CM

## 2023-01-07 MED ORDER — ASPIRIN 81 MG PO TBEC: 81.0000 mg | DELAYED_RELEASE_TABLET | Freq: Every day | ORAL | 2 refills | Status: DC

## 2023-01-07 NOTE — Telephone Encounter (Signed)
Refill request for Asa '81mg'$  po qd refill   LV- 09/24/22; NV- 01/14/23; LR- 04/11/22 (90 tabs/2 refills)

## 2023-01-14 ENCOUNTER — Ambulatory Visit: Payer: BC Managed Care – PPO | Admitting: Family

## 2023-01-14 ENCOUNTER — Encounter: Payer: Self-pay | Admitting: Family

## 2023-01-14 VITALS — BP 110/68 | HR 68 | Temp 97.6°F | Ht 64.75 in | Wt 174.8 lb

## 2023-01-14 DIAGNOSIS — N903 Dysplasia of vulva, unspecified: Secondary | ICD-10-CM | POA: Diagnosis not present

## 2023-01-14 DIAGNOSIS — R809 Proteinuria, unspecified: Secondary | ICD-10-CM | POA: Diagnosis not present

## 2023-01-14 DIAGNOSIS — Z0000 Encounter for general adult medical examination without abnormal findings: Secondary | ICD-10-CM

## 2023-01-14 DIAGNOSIS — Z124 Encounter for screening for malignant neoplasm of cervix: Secondary | ICD-10-CM

## 2023-01-14 DIAGNOSIS — E782 Mixed hyperlipidemia: Secondary | ICD-10-CM

## 2023-01-14 DIAGNOSIS — Z780 Asymptomatic menopausal state: Secondary | ICD-10-CM | POA: Insufficient documentation

## 2023-01-14 DIAGNOSIS — E118 Type 2 diabetes mellitus with unspecified complications: Secondary | ICD-10-CM

## 2023-01-14 DIAGNOSIS — Z01419 Encounter for gynecological examination (general) (routine) without abnormal findings: Secondary | ICD-10-CM | POA: Insufficient documentation

## 2023-01-14 DIAGNOSIS — Z8544 Personal history of malignant neoplasm of other female genital organs: Secondary | ICD-10-CM

## 2023-01-14 DIAGNOSIS — E663 Overweight: Secondary | ICD-10-CM

## 2023-01-14 DIAGNOSIS — Z1231 Encounter for screening mammogram for malignant neoplasm of breast: Secondary | ICD-10-CM

## 2023-01-14 LAB — MICROALBUMIN / CREATININE URINE RATIO
Creatinine,U: 62.1 mg/dL
Microalb Creat Ratio: 1.1 mg/g (ref 0.0–30.0)
Microalb, Ur: <0.7 mg/dL (ref 0.0–1.9)

## 2023-01-14 NOTE — Assessment & Plan Note (Signed)
Pt advised to work on diet and exercise as tolerated

## 2023-01-14 NOTE — Progress Notes (Signed)
Subjective:  Patient ID: Karen Hartman, female    DOB: 03-Feb-1956  Age: 67 y.o. MRN: [id number]  Patient Care Team: Eugenia Pancoast, FNP as PCP - General (Family Medicine)   CC:  Chief Complaint  Patient presents with   Annual Exam    With PAP    HPI Karen Hartman is a 67 y.o. female who presents today for a well woman exam. She reports consuming a general diet. Home exercise routine includes about thirty minutes a day, walking. She generally feels well. She reports not sleeping well, hard to fall asleep at stay asleep she does wake up early at 5 am. She tries to go to bed earlier so that she can get more sleep out of the night. She does not have additional problems to discuss today.   Vision: overdue but has an appt pending.  Dental: up to date with regular twice yearly screening.  Denies h/o STD   Lung Cancer Screening with low-dose Chest CT:  CT chest done last year.  67 y/o h/o 40 pack year smoker, quit 2017 - Adults age 33-80 who are current cigarette smokers or quit within the last 15 years. Must have 20 pack year history.  AAA Screening: n/a - Men age 62-75 who have ever smoked  Mammogram: 02/10/22 Last pap: 01/02/22, with vaginal atrophy  Colonoscopy:had cologuard, negative 01/16/22 Bone density scan: never had, will order.   Pt is without acute concerns.   Advanced Directives Patient does not have advanced directives including living will and healthcare power of attorney. She does not have a copy in the electronic medical record.   DEPRESSION SCREENING    01/14/2023    8:25 AM 01/02/2022    9:05 AM 09/26/2020    9:47 AM 09/19/2019    3:26 PM 09/13/2018    4:07 PM 08/24/2017    8:22 AM  PHQ 2/9 Scores  PHQ - 2 Score 0 0 0 0 0 0  PHQ- 9 Score '1   2 3      '$ ROS: Negative unless specifically indicated above in HPI.    Current Outpatient Medications:    aspirin EC ([facility] ASPIRIN LOW DOSE) 81 MG tablet, Take 1 tablet (81 mg total) by mouth 5 (five) times daily.  SWALLOW WHOLE., Disp: 90 tablet, Rfl: 2   atorvastatin (LIPITOR) 20 MG tablet, Take 1 tablet (20 mg total) by mouth daily., Disp: 90 tablet, Rfl: 3   clobetasol ointment (TEMOVATE) [id number] %, Apply 1 application topically as needed. , Disp: , Rfl:    docusate sodium (COLACE) 100 MG capsule, Take 100 mg by mouth daily., Disp: , Rfl:    ezetimibe (ZETIA) 10 MG tablet, TAKE 1 TABLET BY MOUTH EVERY DAY, Disp: 90 tablet, Rfl: 0   lisinopril (ZESTRIL) 5 MG tablet, Take 1 tablet (5 mg total) by mouth daily., Disp: 90 tablet, Rfl: 2   metoprolol succinate (TOPROL-XL) 25 MG 24 hr tablet, Take 1 tablet (25 mg total) by mouth daily., Disp: 90 tablet, Rfl: 2   nitroGLYCERIN (NITROSTAT) 0.4 MG SL tablet, Place 1 tablet (0.4 mg total) under the tongue every 5 (five) minutes as needed for chest pain (up to 3 doses)., Disp: 25 tablet, Rfl: 3   Semaglutide,0.25 or 0.'5MG'$ /DOS, (OZEMPIC, 0.25 OR 0.5 MG/DOSE,) 2 MG/3ML SOPN, Inject 0.5 mg into the skin once a week., Disp: 9 mL, Rfl: 1    Objective:    BP 110/68   Pulse 68   Temp 97.6 F (36.4 C) (Temporal)  Ht 5' 4.75" (1.645 m)   Wt 174 lb 12.8 oz (79.3 kg)   SpO2 98%   BMI 29.31 kg/m   BP Readings from Last 3 Encounters:  01/14/23 110/68  09/24/22 100/62  07/02/22 (!) 90/50      '@PHYSEXAMBYAGE'$ @  Gen: NAD, resting comfortably Breasts: breasts appear normal, no suspicious masses, no skin or nipple changes or axillary nodes Physical Exam Genitourinary:    General: Normal vulva.     Pubic Area: No rash.      Labia:        Right: No rash, tenderness, lesion or injury.        Left: No rash, tenderness, lesion or injury.      Urethra: No prolapse or urethral pain.     Vagina: Normal. No vaginal discharge, tenderness or lesions.     Cervix: No discharge.     Rectum: Normal.  Psych: Normal affect and thought content      Assessment & Plan:  Screening mammogram for breast cancer -     3D Screening Mammogram, Left and Right;  Future  Postmenopausal -     DG Bone Density; Future  Encounter for Papanicolaou smear of vagina as part of routine gynecological examination Assessment & Plan: Pt verbalized consent for pelvic exam Declines std testing hpv thin prep ordered and pending results Pap exam in office completed, pt tolerated well.   Patient Counseling(The following topics were reviewed):  Preventative care handout given to pt  Health maintenance and immunizations reviewed. Please refer to Health maintenance section. Pt advised on safe sex, wearing seatbelts in car, and proper nutrition labwork ordered today for annual Dental health: Discussed importance of regular tooth brushing, flossing, and dental visits.   Orders: -     Cytology - PAP  Vulvar intraepithelial neoplasia (VIN)  History of cancer of vulva  Hyperlipidemia, mixed  Overweight (BMI 25.0-29.9) Assessment & Plan: Pt advised to work on diet and exercise as tolerated    Screening for cervical cancer -     Cytology - PAP  Controlled type 2 diabetes mellitus with complication, without long-term current use of insulin ([facility]CC) Assessment & Plan: Urine microalbumin ordered for today pending results.  Work on diabetic diet and exercise as tolerated. Yearly foot exam, and annual eye exam.     Positive for microalbuminuria Assessment & Plan: Repeat today  Continue lisinopril 5 mg once daily.        Follow-up: Return in about 6 months (around 07/17/2023).   Eugenia Pancoast, FNP

## 2023-01-14 NOTE — Assessment & Plan Note (Signed)
Repeat today  Continue lisinopril 5 mg once daily.

## 2023-01-14 NOTE — Patient Instructions (Addendum)
  Stop by the lab prior to leaving today. I will notify you of your results once received.   Recommendations on keeping yourself healthy:  - Exercise at least 30-45 minutes a day, 3-4 days a week.  - Eat a low-fat diet with lots of fruits and vegetables, up to 7-9 servings per day.  - Seatbelts can save your life. Wear them always.  - Smoke detectors on every level of your home, check batteries every year.  - Eye Doctor - have an eye exam every 1-2 years  - Safe sex - if you may be exposed to STDs, use a condom.  - Alcohol -  If you drink, do it moderately, less than 2 drinks per day.  - [place]. Choose someone to speak for you if you are not able.  - Depression is common in our stressful world.If you're feeling down or losing interest in things you normally enjoy, please come in for a visit.  - Violence - If anyone is threatening or hurting you, please call immediately.  Due to recent changes in healthcare laws, you may see results of your imaging and/or laboratory studies on MyChart before I have had a chance to review them.  I understand that in some cases there may be results that are confusing or concerning to you. Please understand that not all results are received at the same time and often I may need to interpret multiple results in order to provide you with the best plan of care or course of treatment. Therefore, I ask that you please give me 2 business days to thoroughly review all your results before contacting my office for clarification. Should we see a critical lab result, you will be contacted sooner.   I will see you again in one year for your annual comprehensive exam unless otherwise stated and or with acute concerns.  It was a pleasure seeing you today! Please do not hesitate to reach out with any questions and or concerns.  Regards,   Eugenia Pancoast

## 2023-01-14 NOTE — Assessment & Plan Note (Signed)
Urine microalbumin ordered for today pending results.  Work on diabetic diet and exercise as tolerated. Yearly foot exam, and annual eye exam.

## 2023-01-14 NOTE — Assessment & Plan Note (Signed)
Pt verbalized consent for pelvic exam Declines std testing hpv thin prep ordered and pending results Pap exam in office completed, pt tolerated well.   Patient Counseling(The following topics were reviewed):  Preventative care handout given to pt  Health maintenance and immunizations reviewed. Please refer to Health maintenance section. Pt advised on safe sex, wearing seatbelts in car, and proper nutrition labwork ordered today for annual Dental health: Discussed importance of regular tooth brushing, flossing, and dental visits.

## 2023-01-14 NOTE — Addendum Note (Signed)
Addended by: Eugenia Pancoast on: 01/14/2023 10:44 AM   Modules accepted: Orders

## 2023-01-22 ENCOUNTER — Telehealth: Payer: Self-pay | Admitting: Family

## 2023-01-22 LAB — CYTOLOGY - PAP: Adequacy: ABNORMAL

## 2023-01-22 NOTE — Telephone Encounter (Signed)
Pt called returning Shannon's missed call regarding results. Told pt Dugal's response. Pt had no questions/concerns. Scheduled pt next ov to redo pap for 01/26/23. Call back # [phone]

## 2023-01-22 NOTE — Progress Notes (Signed)
Unfortunately the cells were very scant on the pap smear, and they could not interpret appropriately at the lab. We will need to repeat pap for testing.

## 2023-01-23 NOTE — Telephone Encounter (Signed)
noted 

## 2023-01-27 ENCOUNTER — Other Ambulatory Visit (HOSPITAL_COMMUNITY)
Admission: RE | Admit: 2023-01-27 | Discharge: 2023-01-27 | Disposition: A | Discharge status: Home / Self Care | Payer: BC Managed Care – PPO | Source: Ambulatory Visit | Attending: Family | Admitting: Family

## 2023-01-27 ENCOUNTER — Ambulatory Visit: Payer: BC Managed Care – PPO | Admitting: Family

## 2023-01-27 ENCOUNTER — Encounter: Payer: Self-pay | Admitting: Family

## 2023-01-27 VITALS — BP 132/66 | Ht 64.75 in | Wt 175.0 lb

## 2023-01-27 DIAGNOSIS — Z124 Encounter for screening for malignant neoplasm of cervix: Secondary | ICD-10-CM | POA: Diagnosis not present

## 2023-01-27 DIAGNOSIS — R87615 Unsatisfactory cytologic smear of cervix: Secondary | ICD-10-CM | POA: Insufficient documentation

## 2023-01-27 NOTE — Assessment & Plan Note (Signed)
Last pap with scant cervical cells requiring repeat today.  Pending results.

## 2023-01-27 NOTE — Patient Instructions (Addendum)
  I have sent an electronic order over to your preferred location for the following:   []   2D Mammogram  [x]   3D Mammogram  [x]   Bone Density   Please give this center a call to get scheduled at your convenience.  [x]   [facility]  [place]  [id number]  [phone]  Make sure to wear two piece  clothing  No lotions powders or deodorants the day of the appointment Make sure to bring picture ID and insurance card.  Bring list of medications you are currently taking including any supplements.    ------------------------------------  Recommend trying some replenz over the counter.    ------------------------------------   Regards,   Eugenia Pancoast FNP-C

## 2023-01-27 NOTE — Progress Notes (Signed)
   Subjective:  Patient ID: Karen Hartman, female    DOB: Jan 04, 1956  Age: 67 y.o. MRN: [id number]  Patient Care Team: Eugenia Pancoast, FNP as PCP - General (Family Medicine)   CC:  Chief Complaint  Patient presents with   Gynecologic Exam    Redo Pap    HPI Karen Hartman is a 67 y.o. female who presents today for a repeat pap smear. The prior one was unable to be read.  Pt is without acute concerns.   Did have laser surgery with perineal pre cancerous cells so has not had sex in eight weeks, but she denies h/o pain with sex however last pap smear was painful for her.           ROS: Negative unless specifically indicated above in HPI.    Current Outpatient Medications:    aspirin EC ([employer] ASPIRIN LOW DOSE) 81 MG tablet, Take 1 tablet (81 mg total) by mouth 5 (five) times daily. SWALLOW WHOLE., Disp: 90 tablet, Rfl: 2   atorvastatin (LIPITOR) 20 MG tablet, Take 1 tablet (20 mg total) by mouth daily., Disp: 90 tablet, Rfl: 3   clobetasol ointment (TEMOVATE) [id number] %, Apply 1 application topically as needed. , Disp: , Rfl:    docusate sodium (COLACE) 100 MG capsule, Take 100 mg by mouth daily., Disp: , Rfl:    ezetimibe (ZETIA) 10 MG tablet, TAKE 1 TABLET BY MOUTH EVERY DAY, Disp: 90 tablet, Rfl: 0   lisinopril (ZESTRIL) 5 MG tablet, Take 1 tablet (5 mg total) by mouth daily., Disp: 90 tablet, Rfl: 2   metoprolol succinate (TOPROL-XL) 25 MG 24 hr tablet, Take 1 tablet (25 mg total) by mouth daily., Disp: 90 tablet, Rfl: 2   nitroGLYCERIN (NITROSTAT) 0.4 MG SL tablet, Place 1 tablet (0.4 mg total) under the tongue every 5 (five) minutes as needed for chest pain (up to 3 doses)., Disp: 25 tablet, Rfl: 3   Semaglutide,0.25 or 0.5MG /DOS, (OZEMPIC, 0.25 OR 0.5 MG/DOSE,) 2 MG/3ML SOPN, Inject 0.5 mg into the skin once a week., Disp: 9 mL, Rfl: 1    Objective:    BP 132/66   Ht 5' 4.75" (1.645 m)   Wt 175 lb (79.4 kg)   BMI 29.35 kg/m   BP Readings from Last 3 Encounters:   01/27/23 132/66  01/14/23 110/68  09/24/22 100/62      @PHYSEXAMBYAGE @  Gen: NAD, resting comfortably Breasts: breasts appear normal, no suspicious masses, no skin or nipple changes or axillary nodes Physical Exam Genitourinary:    General: Normal vulva.     Pubic Area: No rash.      Labia:        Right: No rash, tenderness, lesion or injury.        Left: No rash, tenderness, lesion or injury.      Urethra: No prolapse or urethral pain.     Vagina: Normal. No vaginal discharge, tenderness or lesions.     Cervix: No discharge.     Rectum: Normal.  Psych: Normal affect and thought content      Assessment & Plan:  Screening for cervical cancer -     Cytology - PAP  Encounter for repeat Pap smear due to previous insuff cervical cells Assessment & Plan: Last pap with scant cervical cells requiring repeat today.  Pending results.       Follow-up: Return as scheduled 07/15/23.   Eugenia Pancoast, FNP

## 2023-01-30 ENCOUNTER — Other Ambulatory Visit (HOSPITAL_COMMUNITY): Payer: Self-pay

## 2023-02-03 LAB — CYTOLOGY - PAP
Comment: NEGATIVE
Diagnosis: NEGATIVE
High risk HPV: NEGATIVE

## 2023-02-18 ENCOUNTER — Ambulatory Visit: Payer: BC Managed Care – PPO | Admitting: Cardiovascular Disease

## 2023-03-03 ENCOUNTER — Other Ambulatory Visit: Payer: Self-pay | Admitting: Family

## 2023-03-03 DIAGNOSIS — E669 Obesity, unspecified: Secondary | ICD-10-CM

## 2023-03-03 DIAGNOSIS — E118 Type 2 diabetes mellitus with unspecified complications: Secondary | ICD-10-CM

## 2023-03-03 DIAGNOSIS — I252 Old myocardial infarction: Secondary | ICD-10-CM

## 2023-03-03 DIAGNOSIS — E782 Mixed hyperlipidemia: Secondary | ICD-10-CM

## 2023-03-09 DIAGNOSIS — E119 Type 2 diabetes mellitus without complications: Secondary | ICD-10-CM | POA: Diagnosis not present

## 2023-03-09 DIAGNOSIS — H2513 Age-related nuclear cataract, bilateral: Secondary | ICD-10-CM | POA: Diagnosis not present

## 2023-03-09 DIAGNOSIS — Z0100 Encounter for examination of eyes and vision without abnormal findings: Secondary | ICD-10-CM | POA: Diagnosis not present

## 2023-03-09 DIAGNOSIS — H43813 Vitreous degeneration, bilateral: Secondary | ICD-10-CM | POA: Diagnosis not present

## 2023-03-09 LAB — HM DIABETES EYE EXAM

## 2023-03-12 ENCOUNTER — Ambulatory Visit
Admission: RE | Admit: 2023-03-12 | Discharge: 2023-03-12 | Disposition: A | Discharge status: Home / Self Care | Payer: BC Managed Care – PPO | Source: Ambulatory Visit | Attending: Family | Admitting: Family

## 2023-03-12 DIAGNOSIS — Z1231 Encounter for screening mammogram for malignant neoplasm of breast: Secondary | ICD-10-CM

## 2023-03-12 DIAGNOSIS — Z780 Asymptomatic menopausal state: Secondary | ICD-10-CM | POA: Diagnosis not present

## 2023-03-12 DIAGNOSIS — M85832 Other specified disorders of bone density and structure, left forearm: Secondary | ICD-10-CM | POA: Diagnosis not present

## 2023-03-20 ENCOUNTER — Ambulatory Visit
Admission: RE | Admit: 2023-03-20 | Discharge: 2023-03-20 | Disposition: A | Discharge status: Home / Self Care | Payer: BC Managed Care – PPO | Source: Ambulatory Visit | Attending: Family | Admitting: Family

## 2023-03-20 DIAGNOSIS — Z87891 Personal history of nicotine dependence: Secondary | ICD-10-CM | POA: Insufficient documentation

## 2023-03-20 DIAGNOSIS — Z122 Encounter for screening for malignant neoplasm of respiratory organs: Secondary | ICD-10-CM

## 2023-03-25 ENCOUNTER — Other Ambulatory Visit: Payer: Self-pay | Admitting: Acute Care

## 2023-03-25 DIAGNOSIS — Z122 Encounter for screening for malignant neoplasm of respiratory organs: Secondary | ICD-10-CM

## 2023-03-25 DIAGNOSIS — Z87891 Personal history of nicotine dependence: Secondary | ICD-10-CM

## 2023-04-01 DIAGNOSIS — D2262 Melanocytic nevi of left upper limb, including shoulder: Secondary | ICD-10-CM | POA: Diagnosis not present

## 2023-04-01 DIAGNOSIS — D2272 Melanocytic nevi of left lower limb, including hip: Secondary | ICD-10-CM | POA: Diagnosis not present

## 2023-04-01 DIAGNOSIS — D2261 Melanocytic nevi of right upper limb, including shoulder: Secondary | ICD-10-CM | POA: Diagnosis not present

## 2023-04-01 DIAGNOSIS — D225 Melanocytic nevi of trunk: Secondary | ICD-10-CM | POA: Diagnosis not present

## 2023-04-07 DIAGNOSIS — H2511 Age-related nuclear cataract, right eye: Secondary | ICD-10-CM | POA: Diagnosis not present

## 2023-04-07 DIAGNOSIS — H2512 Age-related nuclear cataract, left eye: Secondary | ICD-10-CM | POA: Diagnosis not present

## 2023-04-08 ENCOUNTER — Other Ambulatory Visit: Payer: Self-pay | Admitting: Cardiovascular Disease

## 2023-04-30 ENCOUNTER — Other Ambulatory Visit: Payer: Self-pay | Admitting: Cardiovascular Disease

## 2023-05-18 NOTE — Progress Notes (Unsigned)
Cardiology Office Note  Date:  05/19/2023   ID:  Amenda, Serfass 1956/10/18, MRN [id number]  PCP:  Mort Sawyers, FNP   Chief Complaint  Patient presents with   12 month follow up     "Doing well." Medications reviewed by the patient verbally.     HPI:  Karen Hartman is a 67 y.o. female with history of  DM (previously treated with weight loss), tobacco abuse, stopped December 2017 vulvar cancer s/p prior surgery,  CAD with inf-lat STEMI 10/2016 s/p PCI to RCA  Ejection fraction 50 to 55% by echo January 2018 who presents for f/u of her coronary artery disease  Last seen by myself in clinic January 2023 Weight down 215 to 175 today (one year) On low-dose of Ozempic  Labs: A1C 5.4 Old lipids: total chol 114, LDL 45  Rare orthostasis, blood pressure running low today 100 systolic Congestion today, getting over a virus  Denies significant palpitations, no chest pain or shortness of breath concerning for angina  Covid in sept 2022  Denies any shortness of breath or chest pain on exertion  EKG personally reviewed by myself on todays visit EKG Interpretation Date/Time:  Tuesday May 19 2023 08:20:03 EDT Ventricular Rate:  80 PR Interval:  142 QRS Duration:  80 QT Interval:  360 QTC Calculation: 415 R Axis:   20  Text Interpretation: Normal sinus rhythm Normal ECG When compared with ECG of 03-Nov-2016 16:33, Premature ventricular complexes are no longer Present Criteria for Inferior infarct are no longer Present T wave inversion no longer evident in Inferior leads Confirmed by Julien Nordmann ([id number]) on 05/19/2023 8:26:34 AM    Past medical history reviewed Stop smoking December 2017 Smoked for 40 years   echocardiogram January 2018 showing ejection fraction 50 to 55%  ST elevation , stent to the RCA, she had recurrent ST elevation concerning for acute stent thrombosis.   treated with Aggrestat and taken back to the cath lab urgently which showed acute stent  thrombosis which was treated successfully with aspiration thrombectomy, angiojet thrombectomy, and PTCA reloaded with an additional 180mg  of Brilinta, continued on Aggrestat for 18 hours. felt this event possibly happened due to her delay in digesting Brilinta.   troponin peaked at 29.   2D echo 11/02/16 showed EF 30-35%, grade 1 DD, mild MR, moderately dilated RV, moderate RV dysfunction, akinesis of the basal and mid inferoseptal,  inferior, inferolateral walls, and apical inferior and septal walls.    runs of NSVT as well as Torsades (18 seconds) on telemetry.    treated with heparin, amiodarone and lidocaine.   LifeVest was recommended with repeat echo 30 days - if EF >35 and no shocks, could discontinue Lifevest at that time.    PMH:   has a past medical history of Acute ST elevation myocardial infarction (STEMI) (HCC) (11/01/2016), CAD in native artery, Diabetes (HCC), HPV (human papilloma virus) infection (10/20/2017), Hyperlipidemia, mixed (11/01/2016), Ischemic cardiomyopathy, Morbid obesity (HCC), NSVT (nonsustained ventricular tachycardia) (HCC), NSVT (nonsustained ventricular tachycardia) (HCC) (11/05/2016), STEMI involving right coronary artery (HCC) (11/01/2016), Tobacco abuse, Torsades de pointes (HCC), Torsades de pointes (HCC), and Vulvar cancer (HCC).  PSH:    Past Surgical History:  Procedure Laterality Date   CARDIAC CATHETERIZATION N/A 11/01/2016   Procedure: Left Heart Cath and Coronary Angiography;  Surgeon: Tonny Bollman, MD;  Location: [facility] INVASIVE CV LAB;  Service: Cardiovascular;  Laterality: N/A;   CARDIAC CATHETERIZATION N/A 11/01/2016   Procedure: Coronary Stent Intervention;  Surgeon: Casimiro Needle  Excell Seltzer, MD;  Location: [facility] INVASIVE CV LAB;  Service: Cardiovascular;  Laterality: N/A;  DES to Prox RCA- Promus 3.5x28   CESAREAN SECTION     CORONARY THROMBECTOMY N/A 11/01/2016   Procedure: Coronary Thrombectomy;  Surgeon: Tonny Bollman, MD;  Location: [facility] INVASIVE CV  LAB;  Service: Cardiovascular;  Laterality: N/A;   CORONARY ULTRASOUND/IVUS N/A 11/01/2016   Procedure: Intravascular Ultrasound/IVUS;  Surgeon: Tonny Bollman, MD;  Location: [facility] INVASIVE CV LAB;  Service: Cardiovascular;  Laterality: N/A;   LAMINECTOMY  1986   LASER ABLATION  11/25/2022   LEFT HEART CATH AND CORONARY ANGIOGRAPHY N/A 11/01/2016   Procedure: Left Heart Cath and Coronary Angiography;  Surgeon: Tonny Bollman, MD;  Location: [facility] INVASIVE CV LAB;  Service: Cardiovascular;  Laterality: N/A;   TEMPORARY PACEMAKER N/A 11/01/2016   Procedure: Temporary Pacemaker;  Surgeon: Tonny Bollman, MD;  Location: [facility] INVASIVE CV LAB;  Service: Cardiovascular;  Laterality: N/A;   TONSILLECTOMY  1981   VULVECTOMY Right 2016   WISDOM TOOTH EXTRACTION      Current Outpatient Medications  Medication Sig Dispense Refill   aspirin EC ([employer] ASPIRIN LOW DOSE) 81 MG tablet Take 1 tablet (81 mg total) by mouth 5 (five) times daily. SWALLOW WHOLE. 90 tablet 2   atorvastatin (LIPITOR) 20 MG tablet Take 1 tablet (20 mg total) by mouth daily. 90 tablet 3   clobetasol ointment (TEMOVATE) 0.05 % Apply 1 application topically as needed.      docusate sodium (COLACE) 100 MG capsule Take 100 mg by mouth daily.     ezetimibe (ZETIA) 10 MG tablet TAKE 1 TABLET BY MOUTH EVERY DAY 90 tablet 0   lisinopril (ZESTRIL) 5 MG tablet Take 1 tablet (5 mg total) by mouth daily. 90 tablet 2   metoprolol succinate (TOPROL-XL) 25 MG 24 hr tablet Take 1 tablet (25 mg total) by mouth daily. 90 tablet 2   nitroGLYCERIN (NITROSTAT) 0.4 MG SL tablet Place 1 tablet (0.4 mg total) under the tongue every 5 (five) minutes as needed for chest pain (up to 3 doses). 25 tablet 3   Semaglutide,0.25 or 0.5MG /DOS, (OZEMPIC, 0.25 OR 0.5 MG/DOSE,) 2 MG/3ML SOPN INJECT 0.5 MG INTO THE SKIN ONCE A WEEK. 9 mL 1   No current facility-administered medications for this visit.    Allergies:   Patient has no known allergies.   Social History:  The  patient  reports that she quit smoking about 7 years ago. Her smoking use included cigarettes. She has a 30.00 pack-year smoking history. She has never used smokeless tobacco. She reports that she does not drink alcohol and does not use drugs.   Family History:   family history includes AAA (abdominal aortic aneurysm) in her father; Heart disease in her mother; Uterine cancer in her mother.    Review of Systems: Review of Systems  Constitutional:  Positive for weight loss.  Respiratory: Negative.    Cardiovascular: Negative.   Gastrointestinal: Negative.   Musculoskeletal: Negative.   Neurological: Negative.   Psychiatric/Behavioral: Negative.    All other systems reviewed and are negative.   PHYSICAL EXAM: VS:  BP (!) 100/52 (BP Location: Left Arm, Patient Position: Sitting, Cuff Size: Normal)   Pulse 80   Ht 5\' 5"  (1.651 m)   Wt 175 lb 8 oz (79.6 kg)   SpO2 98%   BMI 29.20 kg/m  , BMI Body mass index is 29.2 kg/m. Constitutional:  oriented to person, place, and time. No distress.  HENT:  Head: Grossly normal  Eyes:  no discharge. No scleral icterus.  Neck: No JVD, no carotid bruits  Cardiovascular: Regular rate and rhythm, no murmurs appreciated Pulmonary/Chest: Clear to auscultation bilaterally, no wheezes or rails Abdominal: Soft.  no distension.  no tenderness.  Musculoskeletal: Normal range of motion Neurological:  normal muscle tone. Coordination normal. No atrophy Skin: Skin warm and dry Psychiatric: normal affect, pleasant  Recent Labs: No results found for requested labs within last 365 days.    Lipid Panel Lab Results  Component Value Date   CHOL 114 01/02/2022   HDL 57.60 01/02/2022   LDLCALC 45 01/02/2022   TRIG 56.0 01/02/2022      Wt Readings from Last 3 Encounters:  05/19/23 175 lb 8 oz (79.6 kg)  01/27/23 175 lb (79.4 kg)  01/14/23 174 lb 12.8 oz (79.3 kg)     ASSESSMENT AND PLAN:  Cardiomyopathy, ischemic -  ejection fraction  50-55% in  January 2018 Lymphedema, wears compression hose Currently with no symptoms of angina. No further workup at this time. Continue current medication regimen.  NSVT (nonsustained ventricular tachycardia) (HCC) After COVID had some palpitations, Denies significant palpitations We will decrease metoprolol down to 12.5 daily given hypotension  Hyperlipidemia, mixed Cholesterol at goal last year, likely lower after 40 pound weight loss Schedule sleep number care in September for routine lab work  CAD in native artery Denies angina, no further ischemic workup  Type 2 diabetes mellitus with other circulatory complication, without long-term current use of insulin (HCC) A1c improved with dramatic weight loss and dietary changes  Essential hypertension Blood pressure running low after 40 pound weight loss in the past year Will decrease metoprolol down to 12.5 daily, take in the evening ,  continue lisinopril 5 in the morning If blood pressure continues to run low may need to decrease lisinopril down to 2.5 daily    Total encounter time more than 30 minutes  Greater than 50% was spent in counseling and coordination of care with the patient    Orders Placed This Encounter  Procedures   EKG 12-Lead     Signed, Dossie Arbour, M.D., Ph.D. 05/19/2023  [facility] [place], [place] [phone]

## 2023-05-19 ENCOUNTER — Ambulatory Visit: Payer: BC Managed Care – PPO | Attending: Cardiovascular Disease | Admitting: Cardiovascular Disease

## 2023-05-19 ENCOUNTER — Encounter: Payer: Self-pay | Admitting: Cardiovascular Disease

## 2023-05-19 ENCOUNTER — Encounter: Payer: Self-pay | Admitting: Ophthalmology

## 2023-05-19 VITALS — BP 100/52 | HR 80 | Ht 65.0 in | Wt 175.5 lb

## 2023-05-19 DIAGNOSIS — I252 Old myocardial infarction: Secondary | ICD-10-CM

## 2023-05-19 DIAGNOSIS — E1159 Type 2 diabetes mellitus with other circulatory complications: Secondary | ICD-10-CM | POA: Diagnosis not present

## 2023-05-19 DIAGNOSIS — I255 Ischemic cardiomyopathy: Secondary | ICD-10-CM | POA: Diagnosis not present

## 2023-05-19 DIAGNOSIS — I25118 Atherosclerotic heart disease of native coronary artery with other forms of angina pectoris: Secondary | ICD-10-CM

## 2023-05-19 DIAGNOSIS — Z7985 Long-term (current) use of injectable non-insulin antidiabetic drugs: Secondary | ICD-10-CM

## 2023-05-19 DIAGNOSIS — E782 Mixed hyperlipidemia: Secondary | ICD-10-CM

## 2023-05-19 DIAGNOSIS — Z720 Tobacco use: Secondary | ICD-10-CM

## 2023-05-19 DIAGNOSIS — I4729 Other ventricular tachycardia: Secondary | ICD-10-CM

## 2023-05-19 MED ORDER — METOPROLOL SUCCINATE ER 25 MG PO TB24
12.5000 mg | ORAL_TABLET | Freq: Every day | ORAL | 3 refills | Status: AC
Start: 2023-05-19 — End: ?

## 2023-05-19 NOTE — Patient Instructions (Signed)
Medication Instructions:  Please decrease the metoprolol down to 12.5 mg daily (1/2 pill) Take at night  If you need a refill on your cardiac medications before your next appointment, please call your pharmacy.   Lab work: No new labs needed  Testing/Procedures: No new testing needed  Follow-Up: At [facility], you and your health needs are our priority.  As part of our continuing mission to provide you with exceptional heart care, we have created designated Provider Care Teams.  These Care Teams include your primary Cardiologist (physician) and Advanced Practice Providers (APPs -  Physician Assistants and Nurse Practitioners) who all work together to provide you with the care you need, when you need it.  You will need a follow up appointment in 12 months  Providers on your designated Care Team:   Nicolasa Ducking, NP Eula Listen, PA-C Cadence Fransico Michael, [place]  COVID-19 Vaccine Information can be found at: [email or web address] For questions related to vaccine distribution or appointments, please email [email or web address] or call [phone].

## 2023-05-22 NOTE — Anesthesia Preprocedure Evaluation (Addendum)
Anesthesia Evaluation  Patient identified by MRN, date of birth, ID band Patient awake    Reviewed: Allergy & Precautions, H&P , NPO status , Patient's Chart, lab work & pertinent test results  Airway Mallampati: II  TM Distance: >3 FB Neck ROM: Full    Dental no notable dental hx.    Pulmonary former smoker   Pulmonary exam normal breath sounds clear to auscultation       Cardiovascular hypertension, + angina  + CAD, + Past MI and + Orthopnea  negative cardio ROS Normal cardiovascular exam Rhythm:Regular Rate:Normal  Patient is s/p STEMI and s/p PCI on 12/22-12/23/17.    12-05-16 EF 50-55%, mild hypokinesis inferior myocardium    2D echo 11/02/16 showed EF 30-35%, grade 1 DD, mild MR, moderately dilated RV, moderate RV dysfunction, akinesis of the basal and mid inferoseptal,  inferior, inferolateral walls, and apical inferior and septal walls.     runs of NSVT as well as Torsades (18 seconds) on telemetry.     treated with heparin, amiodarone and lidocaine.   LifeVest was recommended with repeat echo 30 days - if EF >35 and no shocks, could discontinue Lifevest at that time. Latest echo 12-05-16 EF 50-55%        Neuro/Psych negative neurological ROS  negative psych ROS   GI/Hepatic negative GI ROS, Neg liver ROS,,,  Endo/Other  diabetes    Renal/GU negative Renal ROS  negative genitourinary   Musculoskeletal negative musculoskeletal ROS (+)    Abdominal   Peds negative pediatric ROS (+)  Hematology negative hematology ROS (+)   Anesthesia Other Findings Diabetes (HCC)  STEMI involving right coronary artery  Hyperlipidemia, mixed  CAD in native artery Ischemic cardiomyopathy  Tobacco abuse Morbid obesity  Torsades de pointes (HCC)  Vulvar cancer  Acute ST elevation myocardial infarction (STEMI)  Torsades de pointes  NSVT (nonsustained ventricular tachycardia)     Reproductive/Obstetrics negative  OB ROS                             Anesthesia Physical Anesthesia Plan  ASA: 3  Anesthesia Plan: MAC   Post-op Pain Management:    Induction: Intravenous  PONV Risk Score and Plan:   Airway Management Planned: Natural Airway and Nasal Cannula  Additional Equipment:   Intra-op Plan:   Post-operative Plan:   Informed Consent: I have reviewed the patients History and Physical, chart, labs and discussed the procedure including the risks, benefits and alternatives for the proposed anesthesia with the patient or authorized representative who has indicated his/her understanding and acceptance.     Dental Advisory Given  Plan Discussed with: Anesthesiologist, CRNA and Surgeon  Anesthesia Plan Comments: (Patient consented for risks of anesthesia including but not limited to:  - adverse reactions to medications - damage to eyes, teeth, lips or other oral mucosa - nerve damage due to positioning  - sore throat or hoarseness - Damage to heart, brain, nerves, lungs, other parts of body or loss of life  Patient voiced understanding.)        Anesthesia Quick Evaluation

## 2023-05-25 NOTE — Discharge Instructions (Signed)

## 2023-05-27 ENCOUNTER — Ambulatory Visit: Payer: BC Managed Care – PPO | Admitting: Anesthesiology

## 2023-05-27 ENCOUNTER — Ambulatory Visit
Admission: RE | Admit: 2023-05-27 | Discharge: 2023-05-27 | Disposition: A | Discharge status: Home / Self Care | Payer: BC Managed Care – PPO | Attending: Ophthalmology | Admitting: Ophthalmology

## 2023-05-27 ENCOUNTER — Encounter
Admission: RE | Disposition: A | Discharge status: Home / Self Care | Payer: Self-pay | Source: Home / Self Care | Attending: Ophthalmology

## 2023-05-27 ENCOUNTER — Other Ambulatory Visit: Payer: Self-pay

## 2023-05-27 DIAGNOSIS — Z7985 Long-term (current) use of injectable non-insulin antidiabetic drugs: Secondary | ICD-10-CM | POA: Diagnosis not present

## 2023-05-27 DIAGNOSIS — I252 Old myocardial infarction: Secondary | ICD-10-CM | POA: Diagnosis not present

## 2023-05-27 DIAGNOSIS — Z955 Presence of coronary angioplasty implant and graft: Secondary | ICD-10-CM | POA: Insufficient documentation

## 2023-05-27 DIAGNOSIS — H2511 Age-related nuclear cataract, right eye: Secondary | ICD-10-CM | POA: Insufficient documentation

## 2023-05-27 DIAGNOSIS — I25119 Atherosclerotic heart disease of native coronary artery with unspecified angina pectoris: Secondary | ICD-10-CM | POA: Insufficient documentation

## 2023-05-27 DIAGNOSIS — H2512 Age-related nuclear cataract, left eye: Secondary | ICD-10-CM | POA: Diagnosis not present

## 2023-05-27 DIAGNOSIS — I10 Essential (primary) hypertension: Secondary | ICD-10-CM | POA: Insufficient documentation

## 2023-05-27 DIAGNOSIS — E782 Mixed hyperlipidemia: Secondary | ICD-10-CM | POA: Diagnosis not present

## 2023-05-27 DIAGNOSIS — Z6829 Body mass index (BMI) 29.0-29.9, adult: Secondary | ICD-10-CM | POA: Insufficient documentation

## 2023-05-27 DIAGNOSIS — Z8544 Personal history of malignant neoplasm of other female genital organs: Secondary | ICD-10-CM | POA: Insufficient documentation

## 2023-05-27 DIAGNOSIS — Z87891 Personal history of nicotine dependence: Secondary | ICD-10-CM | POA: Diagnosis not present

## 2023-05-27 DIAGNOSIS — Z7984 Long term (current) use of oral hypoglycemic drugs: Secondary | ICD-10-CM | POA: Diagnosis not present

## 2023-05-27 DIAGNOSIS — I255 Ischemic cardiomyopathy: Secondary | ICD-10-CM | POA: Insufficient documentation

## 2023-05-27 DIAGNOSIS — I4729 Other ventricular tachycardia: Secondary | ICD-10-CM | POA: Insufficient documentation

## 2023-05-27 DIAGNOSIS — E1136 Type 2 diabetes mellitus with diabetic cataract: Secondary | ICD-10-CM | POA: Diagnosis not present

## 2023-05-27 DIAGNOSIS — I2510 Atherosclerotic heart disease of native coronary artery without angina pectoris: Secondary | ICD-10-CM | POA: Diagnosis not present

## 2023-05-27 HISTORY — PX: CATARACT EXTRACTION W/PHACO: SHX586

## 2023-05-27 LAB — GLUCOSE, CAPILLARY: Glucose-Capillary: 87 mg/dL (ref 70–99)

## 2023-05-27 SURGERY — PHACOEMULSIFICATION, CATARACT, WITH IOL INSERTION
Anesthesia: Monitor Anesthesia Care | Laterality: Right

## 2023-05-27 MED ORDER — CEFUROXIME OPHTHALMIC INJECTION 1 MG/0.1 ML
INJECTION | OPHTHALMIC | Status: DC | PRN
  Administered 2023-05-27: 1 mg via INTRACAMERAL

## 2023-05-27 MED ORDER — MIDAZOLAM HCL 2 MG/2ML IJ SOLN
INTRAMUSCULAR | Status: DC | PRN
  Administered 2023-05-27: 2 mg via INTRAVENOUS

## 2023-05-27 MED ORDER — LACTATED RINGERS IV SOLN: INTRAVENOUS | Status: DC

## 2023-05-27 MED ORDER — SIGHTPATH DOSE#1 BSS IO SOLN
INTRAOCULAR | Status: DC | PRN
  Administered 2023-05-27: 2 mL

## 2023-05-27 MED ORDER — SIGHTPATH DOSE#1 BSS IO SOLN
INTRAOCULAR | Status: DC | PRN
  Administered 2023-05-27: 82 mL via OPHTHALMIC

## 2023-05-27 MED ORDER — TETRACAINE HCL 0.5 % OP SOLN
1.0000 [drp] | OPHTHALMIC | Status: DC | PRN
  Administered 2023-05-27 (×3): 1 [drp] via OPHTHALMIC

## 2023-05-27 MED ORDER — SIGHTPATH DOSE#1 BSS IO SOLN
INTRAOCULAR | Status: DC | PRN
  Administered 2023-05-27: 15 mL via INTRAOCULAR

## 2023-05-27 MED ORDER — BRIMONIDINE TARTRATE-TIMOLOL 0.2-0.5 % OP SOLN
OPHTHALMIC | Status: DC | PRN
  Administered 2023-05-27: 1 [drp] via OPHTHALMIC

## 2023-05-27 MED ORDER — SIGHTPATH DOSE#1 NA HYALUR & NA CHOND-NA HYALUR IO KIT
PACK | INTRAOCULAR | Status: DC | PRN
  Administered 2023-05-27: 1 via OPHTHALMIC

## 2023-05-27 MED ORDER — ARMC OPHTHALMIC DILATING DROPS
1.0000 | OPHTHALMIC | Status: DC | PRN
  Administered 2023-05-27 (×3): 1 via OPHTHALMIC

## 2023-05-27 SURGICAL SUPPLY — 9 items
CATARACT SUITE SIGHTPATH (MISCELLANEOUS) ×1 IMPLANT
FEE CATARACT SUITE SIGHTPATH (MISCELLANEOUS) ×1 IMPLANT
GLOVE SRG 8 PF TXTR STRL LF DI (GLOVE) ×1 IMPLANT
GLOVE SURG ENC TEXT LTX SZ7.5 (GLOVE) ×1 IMPLANT
GLOVE SURG UNDER POLY LF SZ8 (GLOVE) ×1
LENS IOL TECNIS EYHANCE 19.5 (Intraocular Lens) IMPLANT
NDL FILTER BLUNT 18X1 1/2 (NEEDLE) ×1 IMPLANT
NEEDLE FILTER BLUNT 18X1 1/2 (NEEDLE) ×1 IMPLANT
SYR 3ML LL SCALE MARK (SYRINGE) ×1 IMPLANT

## 2023-05-27 NOTE — Op Note (Signed)
LOCATION:  [facility]   PREOPERATIVE DIAGNOSIS:    Nuclear sclerotic cataract right eye. H25.11   POSTOPERATIVE DIAGNOSIS:  Nuclear sclerotic cataract right eye.     PROCEDURE:  Phacoemusification with posterior chamber intraocular lens placement of the right eye   ULTRASOUND TIME: Procedure(s): CATARACT EXTRACTION PHACO AND INTRAOCULAR LENS PLACEMENT (IOC) RIGHT DIABETIC 7.38 00:44.6 (Right)  LENS:   Implant Name Type Inv. Item Serial No. Manufacturer Lot No. LRB No. Used Action  LENS IOL TECNIS EYHANCE 19.5 - [id number] Intraocular Lens LENS IOL TECNIS EYHANCE 19.5 [id number] [employer]  Right 1 Implanted         SURGEON:  Deirdre Evener, MD   ANESTHESIA:  Topical with tetracaine drops and 2% Xylocaine jelly, augmented with 1% preservative-free intracameral lidocaine.    COMPLICATIONS:  None.   DESCRIPTION OF PROCEDURE:  The patient was identified in the holding room and transported to the operating room and placed in the supine position under the operating microscope.  The right eye was identified as the operative eye and it was prepped and draped in the usual sterile ophthalmic fashion.   A 1 millimeter clear-corneal paracentesis was made at the 12:00 position.  0.5 ml of preservative-free 1% lidocaine was injected into the anterior chamber. The anterior chamber was filled with Viscoat viscoelastic.  A 2.4 millimeter keratome was used to make a near-clear corneal incision at the 9:00 position.  A curvilinear capsulorrhexis was made with a cystotome and capsulorrhexis forceps.  Balanced salt solution was used to hydrodissect and hydrodelineate the nucleus.   Phacoemulsification was then used in stop and chop fashion to remove the lens nucleus and epinucleus.  The remaining cortex was then removed using the irrigation and aspiration handpiece. Provisc was then placed into the capsular bag to distend it for lens placement.  A lens was then injected into the capsular  bag.  The remaining viscoelastic was aspirated.   Wounds were hydrated with balanced salt solution.  The anterior chamber was inflated to a physiologic pressure with balanced salt solution.  No wound leaks were noted. Cefuroxime 0.1 ml of a 10mg /ml solution was injected into the anterior chamber for a dose of 1 mg of intracameral antibiotic at the completion of the case.   Timolol and Brimonidine drops were applied to the eye.  The patient was taken to the recovery room in stable condition without complications of anesthesia or surgery.   Karen Hartman 05/27/2023, 12:07 PM

## 2023-05-27 NOTE — Transfer of Care (Signed)
Immediate Anesthesia Transfer of Care Note  Patient: Karen Hartman  Procedure(s) Performed: CATARACT EXTRACTION PHACO AND INTRAOCULAR LENS PLACEMENT (IOC) RIGHT DIABETIC 7.38 00:44.6 (Right)  Patient Location: PACU  Anesthesia Type: MAC  Level of Consciousness: awake, alert  and patient cooperative  Airway and Oxygen Therapy: Patient Spontanous Breathing and Patient connected to supplemental oxygen  Post-op Assessment: Post-op Vital signs reviewed, Patient's Cardiovascular Status Stable, Respiratory Function Stable, Patent Airway and No signs of Nausea or vomiting  Post-op Vital Signs: Reviewed and stable  Complications: No notable events documented.

## 2023-05-27 NOTE — H&P (Signed)
[facility]   Primary Care Physician:  Mort Sawyers, FNP Ophthalmologist: Dr. Lockie Mola  Pre-Procedure History & Physical: HPI:  Karen Hartman is a 67 y.o. female here for ophthalmic surgery.   Past Medical History:  Diagnosis Date   Acute ST elevation myocardial infarction (STEMI) (HCC) 11/01/2016   CAD in native artery    a. inf/lat STEMI 10/2016 with occluded RCA s/p DES, moderate nonobstructive LAD stenosis and mild nonobstructive LCx stenosis, EF 50-55% -> taken back to cath lab for acute stent thrombosis s/p thromectomy/PTCA of RCA same day. EF 30-35% by echo 11/02/16.    Diabetes (HCC)    HPV (human papilloma virus) infection 10/20/2017   Hyperlipidemia, mixed 11/01/2016   Ischemic cardiomyopathy    Morbid obesity (HCC)    NSVT (nonsustained ventricular tachycardia) (HCC)    a. during adm 10/2016 for STEMI.   NSVT (nonsustained ventricular tachycardia) (HCC) 11/05/2016   STEMI involving right coronary artery (HCC) 11/01/2016   Tobacco abuse    Torsades de pointes (HCC)    a. during adm 10/2016 for STEMI, felt infarct related.   Torsades de pointes (HCC)    Vulvar cancer [facility])     Past Surgical History:  Procedure Laterality Date   CARDIAC CATHETERIZATION N/A 11/01/2016   Procedure: Left Heart Cath and Coronary Angiography;  Surgeon: Tonny Bollman, MD;  Location: [facility] INVASIVE CV LAB;  Service: Cardiovascular;  Laterality: N/A;   CARDIAC CATHETERIZATION N/A 11/01/2016   Procedure: Coronary Stent Intervention;  Surgeon: Tonny Bollman, MD;  Location: [facility] INVASIVE CV LAB;  Service: Cardiovascular;  Laterality: N/A;  DES to Prox RCA- Promus 3.5x28   CESAREAN SECTION     CORONARY THROMBECTOMY N/A 11/01/2016   Procedure: Coronary Thrombectomy;  Surgeon: Tonny Bollman, MD;  Location: [facility] INVASIVE CV LAB;  Service: Cardiovascular;  Laterality: N/A;   CORONARY ULTRASOUND/IVUS N/A 11/01/2016   Procedure: Intravascular Ultrasound/IVUS;  Surgeon: Tonny Bollman, MD;  Location: [facility] INVASIVE CV LAB;  Service: Cardiovascular;  Laterality: N/A;   LAMINECTOMY  1986   LASER ABLATION  11/25/2022   LEFT HEART CATH AND CORONARY ANGIOGRAPHY N/A 11/01/2016   Procedure: Left Heart Cath and Coronary Angiography;  Surgeon: Tonny Bollman, MD;  Location: [facility] INVASIVE CV LAB;  Service: Cardiovascular;  Laterality: N/A;   TEMPORARY PACEMAKER N/A 11/01/2016   Procedure: Temporary Pacemaker;  Surgeon: Tonny Bollman, MD;  Location: [facility] INVASIVE CV LAB;  Service: Cardiovascular;  Laterality: N/A;   TONSILLECTOMY  1981   VULVECTOMY Right 2016   WISDOM TOOTH EXTRACTION      Prior to Admission medications   Medication Sig Start Date End Date Taking? Authorizing Provider  aspirin EC ([employer] ASPIRIN LOW DOSE) 81 MG tablet Take 1 tablet (81 mg total) by mouth 5 (five) times daily. SWALLOW WHOLE. 01/07/23  Yes Dugal, Tabitha, FNP  atorvastatin (LIPITOR) 20 MG tablet Take 1 tablet (20 mg total) by mouth daily. 11/18/22  Yes Dugal, Wyatt Mage, FNP  cholecalciferol (VITAMIN D3) 25 MCG (1000 UNIT) tablet Take 1,000 Units by mouth daily.   Yes [provider]  clobetasol ointment (TEMOVATE) 0.05 % Apply 1 application topically as needed.  09/01/19  Yes [provider]  docusate sodium (COLACE) 100 MG capsule Take 100 mg by mouth daily.   Yes [provider]  ezetimibe (ZETIA) 10 MG tablet TAKE 1 TABLET BY MOUTH EVERY DAY 04/30/23  Yes Gollan, Tollie Pizza, MD  lisinopril (ZESTRIL) 5 MG tablet Take 1 tablet (5 mg total) by mouth daily. 11/18/22  Yes Mort Sawyers, FNP  metoprolol succinate (TOPROL-XL) 25 MG 24 hr tablet Take 0.5 tablets (12.5 mg total) by mouth at bedtime. 05/19/23  Yes Gollan, Tollie Pizza, MD  nitroGLYCERIN (NITROSTAT) 0.4 MG SL tablet Place 1 tablet (0.4 mg total) under the tongue every 5 (five) minutes as needed for chest pain (up to 3 doses). 11/05/16  Yes Dunn, Dayna N, PA-C  potassium phosphate, monobasic, (K-PHOS ORIGINAL) 500 MG tablet Take 500  mg by mouth 4 (four) times daily -  with meals and at bedtime.   Yes [provider]  Semaglutide,0.25 or 0.5MG /DOS, (OZEMPIC, 0.25 OR 0.5 MG/DOSE,) 2 MG/3ML SOPN INJECT 0.5 MG INTO THE SKIN ONCE A WEEK. 03/03/23  Yes Mort Sawyers, FNP    Allergies as of 03/12/2023   (No Known Allergies)    Family History  Problem Relation Age of Onset   Uterine cancer Mother    Heart disease Mother    AAA (abdominal aortic aneurysm) Father    Breast cancer Neg Hx     Social History   Socioeconomic History   Marital status: Married    Spouse name: Casimiro Needle   Number of children: 1   Years of education: BS college   Highest education level: Not on file  Occupational History    Employer: [employer]  Tobacco Use   Smoking status: Former    Current packs/day: 0.00    Average packs/day: 0.8 packs/day for 40.0 years (30.0 ttl pk-yrs)    Types: Cigarettes    Start date: 46    Quit date: 2017    Years since quitting: 7.5   Smokeless tobacco: Never  Vaping Use   Vaping status: Never Used  Substance and Sexual Activity   Alcohol use: No   Drug use: No   Sexual activity: Not Currently    Partners: Male    Birth control/protection: Post-menopausal  Other Topics Concern   Not on file  Social History Narrative   05/02/20   From: the area   Living: with Casimiro Needle, husband since 1982   Work: [employer], Emergency planning/management officer      Family: Son - Charles, no grandchildren - he is nearby, good relationship      Enjoys: yardwork, English as a second language teacher, baking      Exercise: walking the dog daily, slow pace   Diet: diabetic diet      Safety   Seat belts: Yes    Guns: Yes  and secure   Safe in relationships: Yes    Social Determinants of Corporate investment banker Strain: Not on file  Food Insecurity: Not on file  Transportation Needs: Not on file  Physical Activity: Not on file  Stress: Not on file  Social Connections: Not on file  Intimate Partner Violence: Not on file     Review of Systems: See HPI, otherwise negative ROS  Physical Exam: BP 115/66   Temp (!) 97.2 F (36.2 C) (Temporal)   Resp 13   Ht 5\' 5"  (1.651 m)   Wt 79.4 kg   SpO2 99%   BMI 29.12 kg/m  General:   Alert,  pleasant and cooperative in NAD Head:  Normocephalic and atraumatic. Lungs:  Clear to auscultation.    Heart:  Regular rate and rhythm.   Impression/Plan: Karen Hartman is here for ophthalmic surgery.  Risks, benefits, limitations, and alternatives regarding ophthalmic surgery have been reviewed with the patient.  Questions have been answered.  All parties agreeable.   Lockie Mola, MD  05/27/2023, 10:55 AM

## 2023-05-27 NOTE — Anesthesia Postprocedure Evaluation (Signed)
Anesthesia Post Note  Patient: Karen Hartman  Procedure(s) Performed: CATARACT EXTRACTION PHACO AND INTRAOCULAR LENS PLACEMENT (IOC) RIGHT DIABETIC 7.38 00:44.6 (Right)  Patient location during evaluation: PACU Anesthesia Type: MAC Level of consciousness: awake and alert Pain management: pain level controlled Vital Signs Assessment: post-procedure vital signs reviewed and stable Respiratory status: spontaneous breathing, nonlabored ventilation, respiratory function stable and patient connected to nasal cannula oxygen Cardiovascular status: stable and blood pressure returned to baseline Postop Assessment: no apparent nausea or vomiting Anesthetic complications: no   No notable events documented.   Last Vitals:  Vitals:   05/27/23 1214 05/27/23 1216  BP:  (!) 108/55  Pulse: 74 79  Resp: 16 17  Temp:    SpO2:  99%    Last Pain:  Vitals:   05/27/23 1214  TempSrc:   PainSc: 0-No pain                 Monalisa Bayless C Dasie Chancellor

## 2023-05-28 ENCOUNTER — Encounter: Payer: Self-pay | Admitting: Ophthalmology

## 2023-06-02 DIAGNOSIS — H33311 Horseshoe tear of retina without detachment, right eye: Secondary | ICD-10-CM | POA: Diagnosis not present

## 2023-06-02 DIAGNOSIS — H2512 Age-related nuclear cataract, left eye: Secondary | ICD-10-CM | POA: Diagnosis not present

## 2023-06-13 ENCOUNTER — Other Ambulatory Visit: Payer: Self-pay | Admitting: Family

## 2023-06-13 DIAGNOSIS — I252 Old myocardial infarction: Secondary | ICD-10-CM

## 2023-06-13 DIAGNOSIS — I255 Ischemic cardiomyopathy: Secondary | ICD-10-CM

## 2023-06-15 NOTE — Discharge Instructions (Signed)

## 2023-06-17 ENCOUNTER — Encounter
Admission: RE | Disposition: A | Discharge status: Home / Self Care | Payer: Self-pay | Source: Home / Self Care | Attending: Ophthalmology

## 2023-06-17 ENCOUNTER — Ambulatory Visit
Admission: RE | Admit: 2023-06-17 | Discharge: 2023-06-17 | Disposition: A | Discharge status: Home / Self Care | Payer: BC Managed Care – PPO | Attending: Ophthalmology | Admitting: Ophthalmology

## 2023-06-17 ENCOUNTER — Ambulatory Visit: Payer: BC Managed Care – PPO | Admitting: Anesthesiology

## 2023-06-17 ENCOUNTER — Other Ambulatory Visit: Payer: Self-pay

## 2023-06-17 ENCOUNTER — Encounter: Payer: Self-pay | Admitting: Ophthalmology

## 2023-06-17 DIAGNOSIS — H2512 Age-related nuclear cataract, left eye: Secondary | ICD-10-CM | POA: Insufficient documentation

## 2023-06-17 DIAGNOSIS — Z7985 Long-term (current) use of injectable non-insulin antidiabetic drugs: Secondary | ICD-10-CM | POA: Insufficient documentation

## 2023-06-17 DIAGNOSIS — I10 Essential (primary) hypertension: Secondary | ICD-10-CM | POA: Diagnosis not present

## 2023-06-17 DIAGNOSIS — Z6829 Body mass index (BMI) 29.0-29.9, adult: Secondary | ICD-10-CM | POA: Insufficient documentation

## 2023-06-17 DIAGNOSIS — I252 Old myocardial infarction: Secondary | ICD-10-CM | POA: Insufficient documentation

## 2023-06-17 DIAGNOSIS — Z87891 Personal history of nicotine dependence: Secondary | ICD-10-CM | POA: Diagnosis not present

## 2023-06-17 DIAGNOSIS — E1136 Type 2 diabetes mellitus with diabetic cataract: Secondary | ICD-10-CM | POA: Diagnosis not present

## 2023-06-17 DIAGNOSIS — Z955 Presence of coronary angioplasty implant and graft: Secondary | ICD-10-CM | POA: Diagnosis not present

## 2023-06-17 DIAGNOSIS — I2510 Atherosclerotic heart disease of native coronary artery without angina pectoris: Secondary | ICD-10-CM | POA: Insufficient documentation

## 2023-06-17 DIAGNOSIS — H2511 Age-related nuclear cataract, right eye: Secondary | ICD-10-CM | POA: Diagnosis not present

## 2023-06-17 HISTORY — PX: CATARACT EXTRACTION W/PHACO: SHX586

## 2023-06-17 LAB — GLUCOSE, CAPILLARY: Glucose-Capillary: 166 mg/dL — ABNORMAL HIGH (ref 70–99)

## 2023-06-17 SURGERY — PHACOEMULSIFICATION, CATARACT, WITH IOL INSERTION
Anesthesia: Monitor Anesthesia Care | Site: Eye | Laterality: Left

## 2023-06-17 MED ORDER — CEFUROXIME OPHTHALMIC INJECTION 1 MG/0.1 ML
INJECTION | OPHTHALMIC | Status: DC | PRN
  Administered 2023-06-17: .1 mL via INTRACAMERAL

## 2023-06-17 MED ORDER — MIDAZOLAM HCL 2 MG/2ML IJ SOLN
INTRAMUSCULAR | Status: DC | PRN
  Administered 2023-06-17: 2 mg via INTRAVENOUS

## 2023-06-17 MED ORDER — SIGHTPATH DOSE#1 BSS IO SOLN
INTRAOCULAR | Status: DC | PRN
  Administered 2023-06-17: 65 mL via OPHTHALMIC

## 2023-06-17 MED ORDER — SIGHTPATH DOSE#1 BSS IO SOLN
INTRAOCULAR | Status: DC | PRN
  Administered 2023-06-17: 15 mL via INTRAOCULAR

## 2023-06-17 MED ORDER — SIGHTPATH DOSE#1 BSS IO SOLN
INTRAOCULAR | Status: DC | PRN
  Administered 2023-06-17: 2 mL

## 2023-06-17 MED ORDER — ARMC OPHTHALMIC DILATING DROPS
1.0000 | OPHTHALMIC | Status: DC | PRN
  Administered 2023-06-17 (×3): 1 via OPHTHALMIC

## 2023-06-17 MED ORDER — SIGHTPATH DOSE#1 NA HYALUR & NA CHOND-NA HYALUR IO KIT
PACK | INTRAOCULAR | Status: DC | PRN
  Administered 2023-06-17: 1 via OPHTHALMIC

## 2023-06-17 MED ORDER — LACTATED RINGERS IV SOLN: INTRAVENOUS | Status: DC

## 2023-06-17 MED ORDER — TETRACAINE HCL 0.5 % OP SOLN
1.0000 [drp] | OPHTHALMIC | Status: DC | PRN
  Administered 2023-06-17 (×3): 1 [drp] via OPHTHALMIC

## 2023-06-17 MED ORDER — BRIMONIDINE TARTRATE-TIMOLOL 0.2-0.5 % OP SOLN
OPHTHALMIC | Status: DC | PRN
  Administered 2023-06-17: 1 [drp] via OPHTHALMIC

## 2023-06-17 SURGICAL SUPPLY — 11 items
CANNULA ANT/CHMB 27G (MISCELLANEOUS) IMPLANT
CANNULA ANT/CHMB 27GA (MISCELLANEOUS) IMPLANT
CATARACT SUITE SIGHTPATH (MISCELLANEOUS) ×1 IMPLANT
FEE CATARACT SUITE SIGHTPATH (MISCELLANEOUS) ×1 IMPLANT
GLOVE SRG 8 PF TXTR STRL LF DI (GLOVE) ×1 IMPLANT
GLOVE SURG ENC TEXT LTX SZ7.5 (GLOVE) ×1 IMPLANT
GLOVE SURG UNDER POLY LF SZ8 (GLOVE) ×1
LENS IOL TECNIS EYHANCE 20.0 (Intraocular Lens) IMPLANT
NDL FILTER BLUNT 18X1 1/2 (NEEDLE) ×1 IMPLANT
NEEDLE FILTER BLUNT 18X1 1/2 (NEEDLE) ×1 IMPLANT
SYR 3ML LL SCALE MARK (SYRINGE) ×1 IMPLANT

## 2023-06-17 NOTE — H&P (Signed)
[facility]   Primary Care Physician:  Mort Sawyers, FNP Ophthalmologist: Dr. Lockie Mola  Pre-Procedure History & Physical: HPI:  Karen Hartman is a 67 y.o. female here for ophthalmic surgery.   Past Medical History:  Diagnosis Date   Acute ST elevation myocardial infarction (STEMI) (HCC) 11/01/2016   CAD in native artery    a. inf/lat STEMI 10/2016 with occluded RCA s/p DES, moderate nonobstructive LAD stenosis and mild nonobstructive LCx stenosis, EF 50-55% -> taken back to cath lab for acute stent thrombosis s/p thromectomy/PTCA of RCA same day. EF 30-35% by echo 11/02/16.    Diabetes (HCC)    HPV (human papilloma virus) infection 10/20/2017   Hyperlipidemia, mixed 11/01/2016   Ischemic cardiomyopathy    Morbid obesity (HCC)    NSVT (nonsustained ventricular tachycardia) (HCC)    a. during adm 10/2016 for STEMI.   NSVT (nonsustained ventricular tachycardia) (HCC) 11/05/2016   STEMI involving right coronary artery (HCC) 11/01/2016   Tobacco abuse    Torsades de pointes (HCC)    a. during adm 10/2016 for STEMI, felt infarct related.   Torsades de pointes (HCC)    Vulvar cancer [facility])     Past Surgical History:  Procedure Laterality Date   CARDIAC CATHETERIZATION N/A 11/01/2016   Procedure: Left Heart Cath and Coronary Angiography;  Surgeon: Tonny Bollman, MD;  Location: [facility] INVASIVE CV LAB;  Service: Cardiovascular;  Laterality: N/A;   CARDIAC CATHETERIZATION N/A 11/01/2016   Procedure: Coronary Stent Intervention;  Surgeon: Tonny Bollman, MD;  Location: [facility] INVASIVE CV LAB;  Service: Cardiovascular;  Laterality: N/A;  DES to Prox RCA- Promus 3.5x28   CATARACT EXTRACTION W/PHACO Right 05/27/2023   Procedure: CATARACT EXTRACTION PHACO AND INTRAOCULAR LENS PLACEMENT (IOC) RIGHT DIABETIC 7.38 00:44.6;  Surgeon: Lockie Mola, MD;  Location: [facility];  Service: Ophthalmology;  Laterality: Right;   CESAREAN SECTION     CORONARY THROMBECTOMY  N/A 11/01/2016   Procedure: Coronary Thrombectomy;  Surgeon: Tonny Bollman, MD;  Location: [facility] INVASIVE CV LAB;  Service: Cardiovascular;  Laterality: N/A;   CORONARY ULTRASOUND/IVUS N/A 11/01/2016   Procedure: Intravascular Ultrasound/IVUS;  Surgeon: Tonny Bollman, MD;  Location: [facility] INVASIVE CV LAB;  Service: Cardiovascular;  Laterality: N/A;   LAMINECTOMY  1986   LASER ABLATION  11/25/2022   LEFT HEART CATH AND CORONARY ANGIOGRAPHY N/A 11/01/2016   Procedure: Left Heart Cath and Coronary Angiography;  Surgeon: Tonny Bollman, MD;  Location: [facility] INVASIVE CV LAB;  Service: Cardiovascular;  Laterality: N/A;   TEMPORARY PACEMAKER N/A 11/01/2016   Procedure: Temporary Pacemaker;  Surgeon: Tonny Bollman, MD;  Location: [facility] INVASIVE CV LAB;  Service: Cardiovascular;  Laterality: N/A;   TONSILLECTOMY  1981   VULVECTOMY Right 2016   WISDOM TOOTH EXTRACTION      Prior to Admission medications   Medication Sig Start Date End Date Taking? Authorizing Provider  aspirin EC ([employer] ASPIRIN LOW DOSE) 81 MG tablet Take 1 tablet (81 mg total) by mouth 5 (five) times daily. SWALLOW WHOLE. 01/07/23  Yes Dugal, Tabitha, FNP  atorvastatin (LIPITOR) 20 MG tablet Take 1 tablet (20 mg total) by mouth daily. 11/18/22  Yes Dugal, Wyatt Mage, FNP  cholecalciferol (VITAMIN D3) 25 MCG (1000 UNIT) tablet Take 1,000 Units by mouth daily.   Yes [provider]  clobetasol ointment (TEMOVATE) 0.05 % Apply 1 application topically as needed.  09/01/19  Yes [provider]  docusate sodium (COLACE) 100 MG capsule Take 100 mg by mouth daily.   Yes [provider]  ezetimibe (ZETIA) 10 MG tablet TAKE 1 TABLET BY MOUTH EVERY DAY 04/30/23  Yes Gollan, Tollie Pizza, MD  lisinopril (ZESTRIL) 5 MG tablet TAKE 1 TABLET (5 MG TOTAL) BY MOUTH DAILY. 06/15/23  Yes Mort Sawyers, FNP  metoprolol succinate (TOPROL-XL) 25 MG 24 hr tablet Take 0.5 tablets (12.5 mg total) by mouth at bedtime. 05/19/23  Yes Gollan, Tollie Pizza, MD   Semaglutide,0.25 or 0.5MG /DOS, (OZEMPIC, 0.25 OR 0.5 MG/DOSE,) 2 MG/3ML SOPN INJECT 0.5 MG INTO THE SKIN ONCE A WEEK. 03/03/23  Yes Dugal, Tabitha, FNP  nitroGLYCERIN (NITROSTAT) 0.4 MG SL tablet Place 1 tablet (0.4 mg total) under the tongue every 5 (five) minutes as needed for chest pain (up to 3 doses). 11/05/16   Dunn, Tacey Ruiz, PA-C  potassium phosphate, monobasic, (K-PHOS ORIGINAL) 500 MG tablet Take 500 mg by mouth 4 (four) times daily -  with meals and at bedtime. Patient not taking: Reported on 06/10/2023    [provider]    Allergies as of 03/12/2023   (No Known Allergies)    Family History  Problem Relation Age of Onset   Uterine cancer Mother    Heart disease Mother    AAA (abdominal aortic aneurysm) Father    Breast cancer Neg Hx     Social History   Socioeconomic History   Marital status: Married    Spouse name: Casimiro Needle   Number of children: 1   Years of education: BS college   Highest education level: Not on file  Occupational History    Employer: [employer]  Tobacco Use   Smoking status: Former    Current packs/day: 0.00    Average packs/day: 0.8 packs/day for 40.0 years (30.0 ttl pk-yrs)    Types: Cigarettes    Start date: 62    Quit date: 2017    Years since quitting: 7.6   Smokeless tobacco: Never  Vaping Use   Vaping status: Never Used  Substance and Sexual Activity   Alcohol use: No   Drug use: No   Sexual activity: Not Currently    Partners: Male    Birth control/protection: Post-menopausal  Other Topics Concern   Not on file  Social History Narrative   05/02/20   From: the area   Living: with Casimiro Needle, husband since 1982   Work: [employer], Emergency planning/management officer      Family: Son - Charles, no grandchildren - he is nearby, good relationship      Enjoys: yardwork, English as a second language teacher, baking      Exercise: walking the dog daily, slow pace   Diet: diabetic diet      Safety   Seat belts: Yes    Guns: Yes  and secure    Safe in relationships: Yes    Social Determinants of Corporate investment banker Strain: Not on file  Food Insecurity: Not on file  Transportation Needs: Not on file  Physical Activity: Not on file  Stress: Not on file  Social Connections: Not on file  Intimate Partner Violence: Not on file    Review of Systems: See HPI, otherwise negative ROS  Physical Exam: BP (!) 112/47   Pulse 65   Temp (!) 97.2 F (36.2 C) (Temporal)   Resp 19   Ht 5\' 5"  (1.651 m)   Wt 79.8 kg   SpO2 97%   BMI 29.29 kg/m  General:   Alert,  pleasant and cooperative in NAD Head:  Normocephalic and atraumatic. Lungs:  Clear to  auscultation.    Heart:  Regular rate and rhythm.   Impression/Plan: Karen Hartman is here for ophthalmic surgery.  Risks, benefits, limitations, and alternatives regarding ophthalmic surgery have been reviewed with the patient.  Questions have been answered.  All parties agreeable.   Lockie Mola, MD  06/17/2023, 7:35 AM '

## 2023-06-17 NOTE — Anesthesia Postprocedure Evaluation (Signed)
Anesthesia Post Note  Patient: [name]  Procedure(s) Performed: CATARACT EXTRACTION PHACO AND INTRAOCULAR LENS PLACEMENT (IOC) LEFT DIABETIC 4.08 00:32.1 (Left: Eye)  Patient location during evaluation: PACU Anesthesia Type: MAC Level of consciousness: awake and alert Pain management: pain level controlled Vital Signs Assessment: post-procedure vital signs reviewed and stable Respiratory status: spontaneous breathing, nonlabored ventilation, respiratory function stable and patient connected to nasal cannula oxygen Cardiovascular status: stable and blood pressure returned to baseline Postop Assessment: no apparent nausea or vomiting Anesthetic complications: no   No notable events documented.   Last Vitals:  Vitals:   06/17/23 0801 06/17/23 0807  BP:  112/60  Pulse:  70  Resp:  16  Temp: (!) 36.2 C (!) 36.4 C  SpO2:  100%    Last Pain:  Vitals:   06/17/23 0807  TempSrc: Temporal  PainSc: 0-No pain                 Karen Hartman

## 2023-06-17 NOTE — Anesthesia Preprocedure Evaluation (Signed)
Anesthesia Evaluation  Patient identified by MRN, date of birth, ID band Patient awake    Reviewed: Allergy & Precautions, H&P , NPO status , Patient's Chart, lab work & pertinent test results  History of Anesthesia Complications Negative for: history of anesthetic complications  Airway Mallampati: II  TM Distance: >3 FB Neck ROM: Full    Dental no notable dental hx.    Pulmonary former smoker   Pulmonary exam normal breath sounds clear to auscultation       Cardiovascular hypertension, + angina  + CAD, + Past MI and + Orthopnea  negative cardio ROS Normal cardiovascular exam Rhythm:Regular Rate:Normal  Patient is s/p STEMI and s/p PCI on 12/22-12/23/17.    12-05-16 EF 50-55%, mild hypokinesis inferior myocardium    2D echo 11/02/16 showed EF 30-35%, grade 1 DD, mild MR, moderately dilated RV, moderate RV dysfunction, akinesis of the basal and mid inferoseptal,  inferior, inferolateral walls, and apical inferior and septal walls.     runs of NSVT as well as Torsades (18 seconds) on telemetry.     treated with heparin, amiodarone and lidocaine.   LifeVest was recommended with repeat echo 30 days - if EF >35 and no shocks, could discontinue Lifevest at that time. Latest echo 12-05-16 EF 50-55%        Neuro/Psych negative neurological ROS  negative psych ROS   GI/Hepatic negative GI ROS, Neg liver ROS,,,  Endo/Other  diabetes    Renal/GU negative Renal ROS  negative genitourinary   Musculoskeletal negative musculoskeletal ROS (+)    Abdominal   Peds negative pediatric ROS (+)  Hematology negative hematology ROS (+)   Anesthesia Other Findings Diabetes (HCC)  STEMI involving right coronary artery  Hyperlipidemia, mixed  CAD in native artery Ischemic cardiomyopathy  Tobacco abuse Morbid obesity  Torsades de pointes (HCC)  Vulvar cancer  Acute ST elevation myocardial infarction (STEMI)  Torsades de  pointes  NSVT (nonsustained ventricular tachycardia)     Reproductive/Obstetrics negative OB ROS                             Anesthesia Physical Anesthesia Plan  ASA: 3  Anesthesia Plan: MAC   Post-op Pain Management:    Induction: Intravenous  PONV Risk Score and Plan:   Airway Management Planned: Natural Airway and Nasal Cannula  Additional Equipment:   Intra-op Plan:   Post-operative Plan:   Informed Consent: I have reviewed the patients History and Physical, chart, labs and discussed the procedure including the risks, benefits and alternatives for the proposed anesthesia with the patient or authorized representative who has indicated his/her understanding and acceptance.     Dental Advisory Given  Plan Discussed with: Anesthesiologist, CRNA and Surgeon  Anesthesia Plan Comments: (Patient consented for risks of anesthesia including but not limited to:  - adverse reactions to medications - damage to eyes, teeth, lips or other oral mucosa - nerve damage due to positioning  - sore throat or hoarseness - Damage to heart, brain, nerves, lungs, other parts of body or loss of life  Patient voiced understanding.)        Anesthesia Quick Evaluation

## 2023-06-17 NOTE — Addendum Note (Signed)
Addendum  created 06/17/23 0845 by Louie Boston, MD   Attestation recorded in Intraprocedure, Intraprocedure Attestations deleted, Intraprocedure Attestations filed

## 2023-06-17 NOTE — Op Note (Signed)
OPERATIVE NOTE  Karen Hartman [id number] 06/17/2023   PREOPERATIVE DIAGNOSIS:  Nuclear sclerotic cataract left eye. H25.12   POSTOPERATIVE DIAGNOSIS:    Nuclear sclerotic cataract left eye.     PROCEDURE:  Phacoemusification with posterior chamber intraocular lens placement of the left eye  Ultrasound time: Procedure(s): CATARACT EXTRACTION PHACO AND INTRAOCULAR LENS PLACEMENT (IOC) LEFT DIABETIC 4.08 00:32.1 (Left)  LENS:   Implant Name Type Inv. Item Serial No. Manufacturer Lot No. LRB No. Used Action  LENS IOL TECNIS EYHANCE 20.0 - [id number] Intraocular Lens LENS IOL TECNIS EYHANCE 20.0 [id number] [employer]  Left 1 Implanted      SURGEON:  Deirdre Evener, MD   ANESTHESIA:  Topical with tetracaine drops and 2% Xylocaine jelly, augmented with 1% preservative-free intracameral lidocaine.    COMPLICATIONS:  None.   DESCRIPTION OF PROCEDURE:  The patient was identified in the holding room and transported to the operating room and placed in the supine position under the operating microscope.  The left eye was identified as the operative eye and it was prepped and draped in the usual sterile ophthalmic fashion.   A 1 millimeter clear-corneal paracentesis was made at the 1:30 position.  0.5 ml of preservative-free 1% lidocaine was injected into the anterior chamber.  The anterior chamber was filled with Viscoat viscoelastic.  A 2.4 millimeter keratome was used to make a near-clear corneal incision at the 10:30 position.  .  A curvilinear capsulorrhexis was made with a cystotome and capsulorrhexis forceps.  Balanced salt solution was used to hydrodissect and hydrodelineate the nucleus.   Phacoemulsification was then used in stop and chop fashion to remove the lens nucleus and epinucleus.  The remaining cortex was then removed using the irrigation and aspiration handpiece. Provisc was then placed into the capsular bag to distend it for lens placement.  A lens was then injected into  the capsular bag.  The remaining viscoelastic was aspirated.   Wounds were hydrated with balanced salt solution.  The anterior chamber was inflated to a physiologic pressure with balanced salt solution.  No wound leaks were noted. Cefuroxime 0.1 ml of a 10mg /ml solution was injected into the anterior chamber for a dose of 1 mg of intracameral antibiotic at the completion of the case.   Timolol and Brimonidine drops were applied to the eye.  The patient was taken to the recovery room in stable condition without complications of anesthesia or surgery.  , 06/17/2023, 7:59 AM

## 2023-06-17 NOTE — Transfer of Care (Signed)
Immediate Anesthesia Transfer of Care Note  Patient: Karen Hartman  Procedure(s) Performed: CATARACT EXTRACTION PHACO AND INTRAOCULAR LENS PLACEMENT (IOC) LEFT DIABETIC 4.08 00:32.1 (Left: Eye)  Patient Location: PACU  Anesthesia Type: MAC  Level of Consciousness: awake, alert  and patient cooperative  Airway and Oxygen Therapy: Patient Spontanous Breathing and Patient connected to supplemental oxygen  Post-op Assessment: Post-op Vital signs reviewed, Patient's Cardiovascular Status Stable, Respiratory Function Stable, Patent Airway and No signs of Nausea or vomiting  Post-op Vital Signs: Reviewed and stable  Complications: No notable events documented.

## 2023-06-18 ENCOUNTER — Encounter: Payer: Self-pay | Admitting: Ophthalmology

## 2023-06-25 ENCOUNTER — Encounter (INDEPENDENT_AMBULATORY_CARE_PROVIDER_SITE_OTHER): Payer: Self-pay

## 2023-07-07 DIAGNOSIS — Z0100 Encounter for examination of eyes and vision without abnormal findings: Secondary | ICD-10-CM | POA: Diagnosis not present

## 2023-07-07 DIAGNOSIS — H33311 Horseshoe tear of retina without detachment, right eye: Secondary | ICD-10-CM | POA: Diagnosis not present

## 2023-07-07 DIAGNOSIS — H2512 Age-related nuclear cataract, left eye: Secondary | ICD-10-CM | POA: Diagnosis not present

## 2023-07-15 ENCOUNTER — Ambulatory Visit: Payer: BC Managed Care – PPO | Admitting: Family

## 2023-07-20 DIAGNOSIS — H33311 Horseshoe tear of retina without detachment, right eye: Secondary | ICD-10-CM | POA: Diagnosis not present

## 2023-07-20 DIAGNOSIS — H43813 Vitreous degeneration, bilateral: Secondary | ICD-10-CM | POA: Diagnosis not present

## 2023-07-28 ENCOUNTER — Encounter: Payer: Self-pay | Admitting: Family

## 2023-07-28 ENCOUNTER — Telehealth: Payer: Self-pay | Admitting: Family

## 2023-07-28 ENCOUNTER — Ambulatory Visit: Payer: BC Managed Care – PPO | Admitting: Family

## 2023-07-28 VITALS — BP 108/62 | HR 73 | Temp 97.3°F | Ht 64.75 in | Wt 172.4 lb

## 2023-07-28 DIAGNOSIS — E782 Mixed hyperlipidemia: Secondary | ICD-10-CM

## 2023-07-28 DIAGNOSIS — Z79899 Other long term (current) drug therapy: Secondary | ICD-10-CM | POA: Diagnosis not present

## 2023-07-28 DIAGNOSIS — E118 Type 2 diabetes mellitus with unspecified complications: Secondary | ICD-10-CM | POA: Diagnosis not present

## 2023-07-28 DIAGNOSIS — K8020 Calculus of gallbladder without cholecystitis without obstruction: Secondary | ICD-10-CM

## 2023-07-28 DIAGNOSIS — I700 Atherosclerosis of aorta: Secondary | ICD-10-CM | POA: Insufficient documentation

## 2023-07-28 DIAGNOSIS — I998 Other disorder of circulatory system: Secondary | ICD-10-CM | POA: Insufficient documentation

## 2023-07-28 DIAGNOSIS — Z7985 Long-term (current) use of injectable non-insulin antidiabetic drugs: Secondary | ICD-10-CM | POA: Diagnosis not present

## 2023-07-28 DIAGNOSIS — Z87898 Personal history of other specified conditions: Secondary | ICD-10-CM | POA: Insufficient documentation

## 2023-07-28 DIAGNOSIS — Z8544 Personal history of malignant neoplasm of other female genital organs: Secondary | ICD-10-CM

## 2023-07-28 LAB — COMPREHENSIVE METABOLIC PANEL
ALT: 23 U/L (ref 0–35)
AST: 27 U/L (ref 0–37)
Albumin: 3.7 g/dL (ref 3.5–5.2)
Alkaline Phosphatase: 56 U/L (ref 39–117)
BUN: 12 mg/dL (ref 6–23)
CO2: 31 meq/L (ref 19–32)
Calcium: 9.2 mg/dL (ref 8.4–10.5)
Chloride: 103 meq/L (ref 96–112)
Creatinine, Ser: 0.7 mg/dL (ref 0.40–1.20)
GFR: 90.21 mL/min (ref >60.00)
Glucose, Bld: 103 mg/dL — ABNORMAL HIGH (ref 70–99)
Potassium: 4.2 meq/L (ref 3.5–5.1)
Sodium: 137 meq/L (ref 135–145)
Total Bilirubin: 0.9 mg/dL (ref 0.2–1.2)
Total Protein: 6.2 g/dL (ref 6.0–8.3)

## 2023-07-28 LAB — LIPID PANEL
Cholesterol: 116 mg/dL (ref 0–200)
HDL: 58.6 mg/dL (ref >39.00)
LDL Cholesterol: 47 mg/dL (ref 0–99)
NonHDL: 57.78
Total CHOL/HDL Ratio: 2
Triglycerides: 53 mg/dL (ref 0.0–149.0)
VLDL: 10.6 mg/dL (ref 0.0–40.0)

## 2023-07-28 LAB — HEMOGLOBIN A1C: Hgb A1c MFr Bld: 6 % (ref 4.6–6.5)

## 2023-07-28 NOTE — Assessment & Plan Note (Signed)
Pt to maintain annual pap smears, next due 01/2024.  Reaching out to ob gyn to verify length of time she will require annual paps, 75?

## 2023-07-28 NOTE — Assessment & Plan Note (Signed)
Maintain gallbladder friendly diet as able. asymptomatic

## 2023-07-28 NOTE — Progress Notes (Signed)
Established Patient Office Visit  Subjective:      CC:  Chief Complaint  Patient presents with   Medical Management of Chronic Conditions    HPI: Karen Hartman is a 67 y.o. female presenting on 07/28/2023 for Medical Management of Chronic Conditions . H/o bil cataract surgery but three weeks ago had retinal tear right eye. She has seen ophthalmologist for this, she is stable right now. They have not had to intervene with surgery, they are going to monitor.   HTN: recently seen cardiologist, decreased metoprolol to 1/2 25 mg tablet once daily   CT low dose scan, lung cancer screening, 03/20/23, 3.3 mg nodule lll with stability, gallstones seen on imaging and vascular calcifications. Also with atherosclerotic calcifications aortic arch  Wt Readings from Last 3 Encounters:  07/28/23 172 lb 6.4 oz (78.2 kg)  06/17/23 176 lb (79.8 kg)  05/27/23 175 lb (79.4 kg)   DM2: on ozempic 0.5 mg weekly, when we tried to increase 1 mg weekly but n/v which has since resolved. She also is curious if maybe she was passing a gallstone at that time.   Walks twice a day and trying to work on food intake, not always doing well with food but she does pay attention to intake.   Lab Results  Component Value Date   HGBA1C 5.4 09/24/2022   HLD: working on diet changes, tolerating atorvastatin and zetia well. Also followed by dr. Mariah Milling  Lab Results  Component Value Date   CHOL 114 01/02/2022   HDL 57.60 01/02/2022   LDLCALC 45 01/02/2022   TRIG 56.0 01/02/2022   CHOLHDL 2 01/02/2022   H/o MI, on statin , on asa 81 mg once daily, and on metoprolol and also on lisinopril.    H/o vulvar cancer, she states per her gyn onc she is to get yearly pap smears. Her last pap was 01/27/23 negative.     Social history:  Relevant past medical, surgical, family and social history reviewed and updated as indicated. Interim medical history since our last visit reviewed.  Allergies and medications  reviewed and updated.  DATA REVIEWED: CHART IN EPIC     ROS: Negative unless specifically indicated above in HPI.    Current Outpatient Medications:    aspirin EC ([facility] ASPIRIN LOW DOSE) 81 MG tablet, Take 1 tablet (81 mg total) by mouth 5 (five) times daily. SWALLOW WHOLE., Disp: 90 tablet, Rfl: 2   atorvastatin (LIPITOR) 20 MG tablet, Take 1 tablet (20 mg total) by mouth daily., Disp: 90 tablet, Rfl: 3   calcium carbonate (OSCAL) 1500 (600 Ca) MG TABS tablet, Take 600 mg of elemental calcium by mouth daily with breakfast., Disp: , Rfl:    cholecalciferol (VITAMIN D3) 25 MCG (1000 UNIT) tablet, Take 1,000 Units by mouth daily., Disp: , Rfl:    clobetasol ointment (TEMOVATE) 0.05 %, Apply 1 application topically as needed. , Disp: , Rfl:    docusate sodium (COLACE) 100 MG capsule, Take 100 mg by mouth daily., Disp: , Rfl:    ezetimibe (ZETIA) 10 MG tablet, TAKE 1 TABLET BY MOUTH EVERY DAY, Disp: 90 tablet, Rfl: 0   lisinopril (ZESTRIL) 5 MG tablet, TAKE 1 TABLET (5 MG TOTAL) BY MOUTH DAILY., Disp: 90 tablet, Rfl: 2   metoprolol succinate (TOPROL-XL) 25 MG 24 hr tablet, Take 0.5 tablets (12.5 mg total) by mouth at bedtime., Disp: 45 tablet, Rfl: 3   Semaglutide,0.25 or 0.5MG /DOS, (OZEMPIC, 0.25 OR 0.5 MG/DOSE,) 2 MG/3ML SOPN, INJECT 0.5  MG INTO THE SKIN ONCE A WEEK., Disp: 9 mL, Rfl: 1   nitroGLYCERIN (NITROSTAT) 0.4 MG SL tablet, Place 1 tablet (0.4 mg total) under the tongue every 5 (five) minutes as needed for chest pain (up to 3 doses). (Patient not taking: Reported on 07/28/2023), Disp: 25 tablet, Rfl: 3      Objective:    BP 108/62 (BP Location: Right Arm, Patient Position: Sitting, Cuff Size: Normal)   Pulse 73   Temp (!) 97.3 F (36.3 C) (Temporal)   Ht 5' 4.75" (1.645 m)   Wt 172 lb 6.4 oz (78.2 kg)   SpO2 96%   BMI 28.91 kg/m   Wt Readings from Last 3 Encounters:  07/28/23 172 lb 6.4 oz (78.2 kg)  06/17/23 176 lb (79.8 kg)  05/27/23 175 lb (79.4 kg)    Physical  Exam Constitutional:      General: She is not in acute distress.    Appearance: Normal appearance. She is normal weight. She is not ill-appearing, toxic-appearing or diaphoretic.  HENT:     Head: Normocephalic.  Cardiovascular:     Rate and Rhythm: Normal rate and regular rhythm.  Pulmonary:     Effort: Pulmonary effort is normal.     Breath sounds: Normal breath sounds.  Musculoskeletal:        General: Normal range of motion.     Right lower leg: No edema.     Left lower leg: No edema.  Neurological:     General: No focal deficit present.     Mental Status: She is alert and oriented to person, place, and time. Mental status is at baseline.  Psychiatric:        Mood and Affect: Mood normal.        Behavior: Behavior normal.        Thought Content: Thought content normal.        Judgment: Judgment normal.           Assessment & Plan:  History of prediabetes  Aortic atherosclerosis (HCC) Assessment & Plan: Continue working on low cholesterol diet, continue atorvastatin 20 mg nightly  Tolerating well.  Ordering lipid panel today pending results.    Vascular calcification  Gallstones Assessment & Plan: Maintain gallbladder friendly diet as able. asymptomatic   Controlled type 2 diabetes mellitus with complication, without long-term current use of insulin (HCC) Assessment & Plan: Continue ozempic 0.5 mg weekly  A1c ordered today pending results.  Work on diabetic diet and exercise as tolerated. Yearly foot exam, and annual eye exam.    Orders: -     Hemoglobin A1c  Hyperlipidemia, mixed -     Lipid panel  On statin therapy -     Comprehensive metabolic panel  History of cancer of vulva Assessment & Plan: Pt to maintain annual pap smears, next due 01/2024.  Reaching out to ob gyn to verify length of time she will require annual paps, 76?       Return in about 6 months (around 01/25/2024) for f/u CPE, f/u PAP.  Mort Sawyers, MSN, APRN, FNP-C [place]  [facility]

## 2023-07-28 NOTE — Assessment & Plan Note (Signed)
Continue working on low cholesterol diet, continue atorvastatin 20 mg nightly  Tolerating well.  Ordering lipid panel today pending results.

## 2023-07-28 NOTE — Assessment & Plan Note (Signed)
Continue ozempic 0.5 mg weekly  A1c ordered today pending results.  Work on diabetic diet and exercise as tolerated. Yearly foot exam, and annual eye exam.

## 2023-07-28 NOTE — Telephone Encounter (Signed)
Mort Sawyers, FNP  P Dugal Pool Pt sees ob gyn for h/o vulvar cancer She states she sees dr. Nils Flack at [facility] Can we call the office and inquire how often she needs paps as we will do this in our office? And for what projectory? Ongoing or age 67? --------------------------------- Called Dr. Kateri Plummer office, they are going to a clinical staff member return my call to answer these questions.

## 2023-07-30 NOTE — Telephone Encounter (Signed)
Dr Zenaida Niece Le's office called me back and I spoke with Melissa. States that pt gets yearly pelvic exams, following standard guidelines for pap smears for her age and all of this is normally completed by her PCP.

## 2023-07-30 NOTE — Telephone Encounter (Signed)
Called Dr. Zenaida Niece Le's office again. They are going to have someone return my call with this information. Will await call back.

## 2023-07-30 NOTE — Telephone Encounter (Signed)
This is not standard for her age, she is 47. Over 65 we can cut off unless malignant history. I will continue with annual paps until told otherwise.

## 2023-07-31 ENCOUNTER — Other Ambulatory Visit: Payer: Self-pay | Admitting: Cardiovascular Disease

## 2023-08-13 ENCOUNTER — Other Ambulatory Visit: Payer: Self-pay | Admitting: Family

## 2023-08-13 DIAGNOSIS — I252 Old myocardial infarction: Secondary | ICD-10-CM

## 2023-08-13 DIAGNOSIS — E782 Mixed hyperlipidemia: Secondary | ICD-10-CM

## 2023-08-13 DIAGNOSIS — E118 Type 2 diabetes mellitus with unspecified complications: Secondary | ICD-10-CM

## 2023-08-13 DIAGNOSIS — I255 Ischemic cardiomyopathy: Secondary | ICD-10-CM

## 2023-08-13 DIAGNOSIS — E66811 Obesity, class 1: Secondary | ICD-10-CM

## 2023-08-17 DIAGNOSIS — R87619 Unspecified abnormal cytological findings in specimens from cervix uteri: Secondary | ICD-10-CM | POA: Diagnosis not present

## 2023-08-17 DIAGNOSIS — Z8544 Personal history of malignant neoplasm of other female genital organs: Secondary | ICD-10-CM | POA: Diagnosis not present

## 2023-08-17 DIAGNOSIS — Z9079 Acquired absence of other genital organ(s): Secondary | ICD-10-CM | POA: Diagnosis not present

## 2023-08-17 DIAGNOSIS — N903 Dysplasia of vulva, unspecified: Secondary | ICD-10-CM | POA: Diagnosis not present

## 2023-08-17 DIAGNOSIS — E119 Type 2 diabetes mellitus without complications: Secondary | ICD-10-CM | POA: Diagnosis not present

## 2023-08-17 DIAGNOSIS — D071 Carcinoma in situ of vulva: Secondary | ICD-10-CM | POA: Diagnosis not present

## 2023-08-25 ENCOUNTER — Other Ambulatory Visit: Payer: Self-pay | Admitting: Family

## 2023-08-25 DIAGNOSIS — I255 Ischemic cardiomyopathy: Secondary | ICD-10-CM

## 2023-08-25 DIAGNOSIS — I252 Old myocardial infarction: Secondary | ICD-10-CM

## 2023-10-03 ENCOUNTER — Other Ambulatory Visit: Payer: Self-pay | Admitting: Family

## 2023-10-03 DIAGNOSIS — I252 Old myocardial infarction: Secondary | ICD-10-CM

## 2023-10-03 DIAGNOSIS — E782 Mixed hyperlipidemia: Secondary | ICD-10-CM

## 2023-10-03 DIAGNOSIS — E66811 Obesity, class 1: Secondary | ICD-10-CM

## 2023-10-03 DIAGNOSIS — E118 Type 2 diabetes mellitus with unspecified complications: Secondary | ICD-10-CM

## 2023-10-12 DIAGNOSIS — E119 Type 2 diabetes mellitus without complications: Secondary | ICD-10-CM | POA: Diagnosis not present

## 2023-10-12 DIAGNOSIS — Z08 Encounter for follow-up examination after completed treatment for malignant neoplasm: Secondary | ICD-10-CM | POA: Diagnosis not present

## 2023-10-12 DIAGNOSIS — N903 Dysplasia of vulva, unspecified: Secondary | ICD-10-CM | POA: Diagnosis not present

## 2023-10-20 ENCOUNTER — Encounter: Payer: Self-pay | Admitting: Family

## 2023-10-20 DIAGNOSIS — U071 COVID-19: Secondary | ICD-10-CM

## 2023-10-20 MED ORDER — NIRMATRELVIR/RITONAVIR (PAXLOVID)TABLET
3.0000 | ORAL_TABLET | Freq: Two times a day (BID) | ORAL | 0 refills | Status: AC
Start: 2023-10-20 — End: 2023-10-25

## 2023-10-20 NOTE — Telephone Encounter (Signed)
   Please see the MyChart message reply(ies) for my assessment and plan.  The patient gave consent for this Medical Advice Message and is aware that it may result in a bill to their insurance company as well as the possibility that this may result in a co-payment or deductible. They are an established patient, but are not seeking medical advice exclusively about a problem treated during an in person or video visit in the last 7 days. I did not recommend an in person or video visit within 7 days of my reply.  I spent a total of 3 minutes cumulative time within 7 days through MyChart messaging Azalyn Sliwa, FNP  

## 2023-10-20 NOTE — Addendum Note (Signed)
Addended by: Mort Sawyers on: 10/20/2023 04:19 PM   Modules accepted: Orders

## 2023-10-20 NOTE — Telephone Encounter (Signed)
Please see the MyChart message reply(ies) for my assessment and plan.  The patient gave consent for this Medical Advice Message and is aware that it may result in a bill to their insurance company as well as the possibility that this may result in a co-payment or deductible. They are an established patient, but are not seeking medical advice exclusively about a problem treated during an in person or video visit in the last 7 days. I did not recommend an in person or video visit within 7 days of my reply.  I spent a total 2 minutes cumulative time within 7 days through [employer] Mort Sawyers, FNP

## 2023-10-30 ENCOUNTER — Encounter: Payer: Self-pay | Admitting: Family

## 2023-10-30 DIAGNOSIS — E66811 Obesity, class 1: Secondary | ICD-10-CM

## 2023-10-30 DIAGNOSIS — I252 Old myocardial infarction: Secondary | ICD-10-CM

## 2023-10-30 DIAGNOSIS — E118 Type 2 diabetes mellitus with unspecified complications: Secondary | ICD-10-CM

## 2023-10-30 DIAGNOSIS — E782 Mixed hyperlipidemia: Secondary | ICD-10-CM

## 2023-11-02 MED ORDER — OZEMPIC (0.25 OR 0.5 MG/DOSE) 2 MG/3ML ~~LOC~~ SOPN
0.5000 mg | PEN_INJECTOR | SUBCUTANEOUS | 1 refills | Status: DC
Start: 2023-11-02 — End: 2024-02-03

## 2023-11-08 ENCOUNTER — Other Ambulatory Visit: Payer: Self-pay | Admitting: Family

## 2023-11-08 DIAGNOSIS — E782 Mixed hyperlipidemia: Secondary | ICD-10-CM

## 2023-11-08 DIAGNOSIS — I252 Old myocardial infarction: Secondary | ICD-10-CM

## 2023-12-14 ENCOUNTER — Other Ambulatory Visit: Payer: Self-pay | Admitting: Family

## 2023-12-14 DIAGNOSIS — I255 Ischemic cardiomyopathy: Secondary | ICD-10-CM

## 2023-12-14 DIAGNOSIS — I252 Old myocardial infarction: Secondary | ICD-10-CM

## 2023-12-29 LAB — HM DIABETES EYE EXAM

## 2023-12-31 LAB — LAB REPORT - SCANNED: A1c: 5.8

## 2024-01-25 ENCOUNTER — Encounter: Payer: Self-pay | Admitting: Family

## 2024-01-28 ENCOUNTER — Other Ambulatory Visit (HOSPITAL_COMMUNITY): Payer: Self-pay

## 2024-01-28 ENCOUNTER — Telehealth: Payer: Self-pay

## 2024-01-28 NOTE — Telephone Encounter (Signed)
 Pharmacy Patient Advocate Encounter   Received notification from Patient Advice Request messages that prior authorization for OZEMPIC 0.25-0.5 MG/DOSE PEN is required/requested.   Insurance verification completed.   The patient is insured through [facility] .   Per test claim: PA required; PA submitted to above mentioned insurance via Prompt PA Key/confirmation #/EOC [id number] Status is pending     [email or web address]

## 2024-01-29 ENCOUNTER — Other Ambulatory Visit (HOSPITAL_COMMUNITY): Payer: Self-pay

## 2024-01-29 NOTE — Telephone Encounter (Signed)
 Pharmacy Patient Advocate Encounter  Received notification from [facility] that Prior Authorization for OZEMPIC 0.25-0.5 MG/DOSE PEN  has been APPROVED from 01/28/24 to 01/26/25. Ran test claim, Copay is $0. This test claim was processed through [facility]- copay amounts may vary at other pharmacies due to pharmacy/plan contracts, or as the patient moves through the different stages of their insurance plan.   PA #/Case ID/Reference #: [id number]

## 2024-01-29 NOTE — Telephone Encounter (Signed)
 Called patient let know approved. She will call if any questions.

## 2024-02-03 ENCOUNTER — Encounter: Payer: Self-pay | Admitting: Family

## 2024-02-03 ENCOUNTER — Ambulatory Visit (INDEPENDENT_AMBULATORY_CARE_PROVIDER_SITE_OTHER): Payer: BC Managed Care – PPO | Admitting: Family

## 2024-02-03 VITALS — BP 108/62 | HR 62 | Temp 98.0°F | Ht 64.75 in | Wt 174.0 lb

## 2024-02-03 DIAGNOSIS — R809 Proteinuria, unspecified: Secondary | ICD-10-CM | POA: Diagnosis not present

## 2024-02-03 DIAGNOSIS — Z0001 Encounter for general adult medical examination with abnormal findings: Secondary | ICD-10-CM | POA: Insufficient documentation

## 2024-02-03 DIAGNOSIS — E663 Overweight: Secondary | ICD-10-CM

## 2024-02-03 DIAGNOSIS — Z8544 Personal history of malignant neoplasm of other female genital organs: Secondary | ICD-10-CM | POA: Diagnosis not present

## 2024-02-03 DIAGNOSIS — Z7985 Long-term (current) use of injectable non-insulin antidiabetic drugs: Secondary | ICD-10-CM | POA: Diagnosis not present

## 2024-02-03 DIAGNOSIS — I700 Atherosclerosis of aorta: Secondary | ICD-10-CM

## 2024-02-03 DIAGNOSIS — E118 Type 2 diabetes mellitus with unspecified complications: Secondary | ICD-10-CM | POA: Diagnosis not present

## 2024-02-03 DIAGNOSIS — E782 Mixed hyperlipidemia: Secondary | ICD-10-CM

## 2024-02-03 LAB — MICROALBUMIN / CREATININE URINE RATIO
Creatinine,U: 76.7 mg/dL
Microalb Creat Ratio: UNDETERMINED mg/g (ref 0.0–30.0)
Microalb, Ur: <0.7 mg/dL

## 2024-02-03 NOTE — Assessment & Plan Note (Signed)
Repeat today  Continue lisinopril 5 mg once daily.

## 2024-02-03 NOTE — Progress Notes (Signed)
 Subjective:  Patient ID: Karen Hartman, female    DOB: [birth year]  Age: 68 y.o. MRN: [id number]  Patient Care Team: Mort Sawyers, FNP as PCP - General (Family Medicine)   CC:  Chief Complaint  Patient presents with   Annual Exam    HPI Karen Hartman is a 68 y.o. female who presents today for an annual physical exam. She reports consuming a general diet.  Twice a day she is walking  She generally feels well. She reports sleeping fairly well. She does not have additional problems to discuss today.   Vision:Within last year Dental:Receives regular dental care Recent eye exam with diabetic retinopathy   Lung Cancer Screening with low-dose Chest CT: annual, already in program, utd  Mammogram: 03/12/23 Last pap: no longer needed, followed by [facility] gyn, last bx 11/25/22 Squamous cell carcinoma in situ, HSIL  Gyn recommended instead of another pap follow closely with dermatology  Colonoscopy: Bone density scan: 03/12/23 osteopenia, she is taking daily calcium and vitamin D  Pt is without acute concerns.   fyi, cataract left eye, repaired. Retinal tear right eye, identified with ophthalmology,   DM2: she would like to consider increase of ozempic because she is starting to have her cravings resurface. She is on 0.5 mg has been on this dose for some time however she is a bit hesitant because last time she went to 1 mg she was nauseous and was throwing up.  Advanced Directives Patient does not have advanced directives I  DEPRESSION SCREENING    02/03/2024    8:08 AM 07/28/2023    9:23 AM 01/14/2023    8:25 AM 01/02/2022    9:05 AM 09/26/2020    9:47 AM 09/19/2019    3:26 PM 09/13/2018    4:07 PM  PHQ 2/9 Scores  PHQ - 2 Score 0 0 0 0 0 0 0  PHQ- 9 Score 3 2 1   2 3      ROS: Negative unless specifically indicated above in HPI.    Current Outpatient Medications:    ASPIRIN LOW DOSE 81 MG tablet, TAKE 1 TABLET (81 MG TOTAL) BY MOUTH 5 (FIVE) TIMES DAILY. SWALLOW WHOLE.  (Patient taking differently: Take 81 mg by mouth daily.), Disp: 90 tablet, Rfl: 1   atorvastatin (LIPITOR) 20 MG tablet, TAKE 1 TABLET BY MOUTH EVERY DAY, Disp: 90 tablet, Rfl: 3   cholecalciferol (VITAMIN D3) 25 MCG (1000 UNIT) tablet, Take 1,000 Units by mouth daily., Disp: , Rfl:    clobetasol ointment (TEMOVATE) 0.05 %, Apply 1 application topically as needed. , Disp: , Rfl:    docusate sodium (COLACE) 100 MG capsule, Take 100 mg by mouth daily., Disp: , Rfl:    ezetimibe (ZETIA) 10 MG tablet, TAKE 1 TABLET BY MOUTH EVERY DAY, Disp: 90 tablet, Rfl: 3   lisinopril (ZESTRIL) 5 MG tablet, TAKE 1 TABLET (5 MG TOTAL) BY MOUTH DAILY., Disp: 90 tablet, Rfl: 2   metoprolol succinate (TOPROL-XL) 25 MG 24 hr tablet, TAKE 1 TABLET (25 MG TOTAL) BY MOUTH DAILY., Disp: 90 tablet, Rfl: 2   nitroGLYCERIN (NITROSTAT) 0.4 MG SL tablet, Place 1 tablet (0.4 mg total) under the tongue every 5 (five) minutes as needed for chest pain (up to 3 doses). (Patient not taking: Reported on 07/28/2023), Disp: 25 tablet, Rfl: 3    Objective:    BP 108/62 (BP Location: Left Arm, Patient Position: Sitting, Cuff Size: Normal)   Pulse 62   Temp 98 F (  36.7 C) (Temporal)   Ht 5' 4.75" (1.645 m)   Wt 174 lb (78.9 kg)   SpO2 99%   BMI 29.18 kg/m   BP Readings from Last 3 Encounters:  02/03/24 108/62  07/28/23 108/62  06/17/23 112/60      Physical Exam Constitutional:      General: She is not in acute distress.    Appearance: Normal appearance. She is normal weight. She is not ill-appearing.  HENT:     Head: Normocephalic.     Right Ear: Tympanic membrane normal.     Left Ear: Tympanic membrane normal.     Nose: Nose normal.     Mouth/Throat:     Mouth: Mucous membranes are moist.  Eyes:     Extraocular Movements: Extraocular movements intact.     Pupils: Pupils are equal, round, and reactive to light.  Cardiovascular:     Rate and Rhythm: Normal rate and regular rhythm.  Pulmonary:     Effort: Pulmonary  effort is normal.     Breath sounds: Normal breath sounds.  Abdominal:     General: Abdomen is flat. Bowel sounds are normal.     Palpations: Abdomen is soft.     Tenderness: There is no guarding or rebound.  Musculoskeletal:        General: Normal range of motion.     Cervical back: Normal range of motion.  Skin:    General: Skin is warm.     Capillary Refill: Capillary refill takes less than 2 seconds.  Neurological:     General: No focal deficit present.     Mental Status: She is alert.  Psychiatric:        Mood and Affect: Mood normal.        Behavior: Behavior normal.        Thought Content: Thought content normal.        Judgment: Judgment normal.    Title   Diabetic Foot Exam - detailed Is there a history of foot ulcer?: No Is there a foot ulcer now?: No Is there swelling?: No Is there elevated skin temperature?: No Is there abnormal foot shape?: No Is there a claw toe deformity?: No Are the toenails long?: No Are the toenails thick?: No Are the toenails ingrown?: No Is the skin thin, fragile, shiny and hairless?": No Normal Range of Motion?: Yes Is there foot or ankle muscle weakness?: No Do you have pain in calf while walking?: No Are the shoes appropriate in style and fit?: Yes Can the patient see the bottom of their feet?: Yes Pulse Foot Exam completed.: Yes   Right Posterior Tibialis: Present Left posterior Tibialis: Present   Right Dorsalis Pedis: Present Left Dorsalis Pedis: Present     Semmes-Weinstein Monofilament Test "+" means "has sensation" and "-" means "no sensation"  R Foot Test Control: Pos L Foot Test Control: Pos   R Site 1-Great Toe: Pos L Site 1-Great Toe: Pos   R Site 4: Pos L Site 4: Pos   R site 5: Pos L Site 5: Pos  R Site 6: Pos L Site 6: Pos     Image components are not supported.   Image components are not supported. Image components are not supported.  [place] Comments        [employer] 'know your numbers' program  comes to her place of employment back in feb 2025 and A1c was 5.8.    Lab Results  Component Value Date   HGBA1C 6.0  07/28/2023     Assessment & Plan:  Controlled type 2 diabetes mellitus with complication, without long-term current use of insulin (HCC) Assessment & Plan: Continue ozempic 0.5 mg weekly  Work on diabetic diet and exercise as tolerated. Yearly foot exam, and annual eye exam.    Orders: -     Microalbumin / creatinine urine ratio  History of cancer of vulva  Positive for microalbuminuria Assessment & Plan: Repeat today  Continue lisinopril 5 mg once daily.    Hyperlipidemia, mixed Assessment & Plan: Work on low cholesterol diet and exercise as tolerated    Aortic atherosclerosis (HCC)  Encounter for general adult medical examination with abnormal findings Assessment & Plan: Patient Counseling(The following topics were reviewed):  Preventative care handout given to pt  Health maintenance and immunizations reviewed. Please refer to Health maintenance section. Pt advised on safe sex, wearing seatbelts in car, and proper nutrition labwork ordered today for annual Dental health: Discussed importance of regular tooth brushing, flossing, and dental visits.    Overweight (BMI 25.0-29.9) Assessment & Plan: Pt advised to work on diet and exercise as tolerated        Follow-up: Return in about 6 months (around 08/05/2024) for f/u diabetes.   Mort Sawyers, FNP

## 2024-02-03 NOTE — Assessment & Plan Note (Addendum)
Work on low cholesterol diet and exercise as tolerated  

## 2024-02-03 NOTE — Assessment & Plan Note (Signed)
 Continue ozempic 0.5 mg weekly  Work on diabetic diet and exercise as tolerated. Yearly foot exam, and annual eye exam.

## 2024-02-03 NOTE — Assessment & Plan Note (Signed)

## 2024-02-03 NOTE — Assessment & Plan Note (Signed)
 Pt advised to work on diet and exercise as tolerated

## 2024-02-04 NOTE — Progress Notes (Signed)
 noted

## 2024-02-08 ENCOUNTER — Telehealth: Payer: Self-pay | Admitting: Family

## 2024-02-08 NOTE — Telephone Encounter (Signed)
 Patient dropped off lab results for provider placed in providers box at front desk

## 2024-02-09 ENCOUNTER — Other Ambulatory Visit: Payer: Self-pay | Admitting: Family

## 2024-02-09 DIAGNOSIS — Z1231 Encounter for screening mammogram for malignant neoplasm of breast: Secondary | ICD-10-CM

## 2024-02-09 NOTE — Telephone Encounter (Signed)
 Labs have been placed in Karen Hartman's in box.

## 2024-02-15 ENCOUNTER — Encounter: Payer: Self-pay | Admitting: Family

## 2024-03-06 ENCOUNTER — Other Ambulatory Visit: Payer: Self-pay | Admitting: Family

## 2024-03-06 DIAGNOSIS — I252 Old myocardial infarction: Secondary | ICD-10-CM

## 2024-03-06 DIAGNOSIS — I255 Ischemic cardiomyopathy: Secondary | ICD-10-CM

## 2024-03-15 ENCOUNTER — Encounter

## 2024-03-21 ENCOUNTER — Encounter (HOSPITAL_COMMUNITY): Payer: Self-pay

## 2024-03-23 IMAGING — CT CT CHEST LUNG CANCER SCREENING LOW DOSE W/O CM
2 of 5 series · 15 of 40 positions shown, 18 images · non-contrast
Comparison: No comparison studies available.

CLINICAL DATA: 65-year-old female with 30 pack-year history of
smoking. Lung cancer screening.



[Series 3: lung 1.00 · axial · 0.71mm/px · z∈[-1207,-917]mm · 12 of 320 slices shown, 15 images]
[im 15/320  mediastinal]
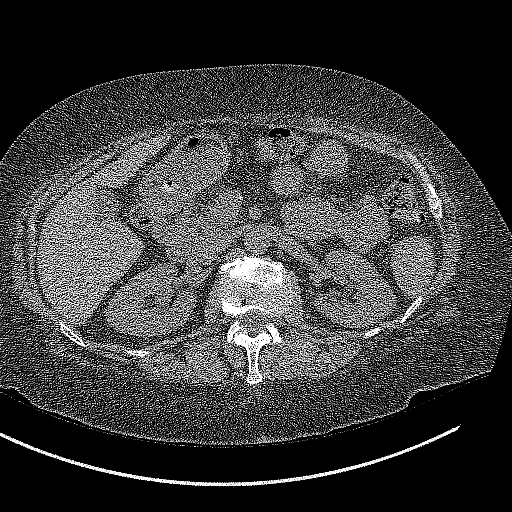
[im 15/320  lung]
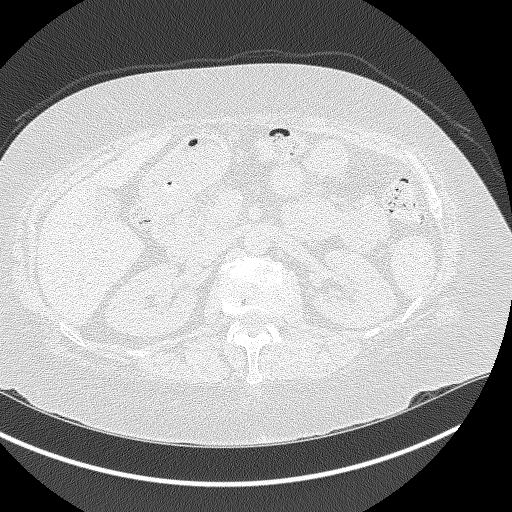
[im 44/320  lung]
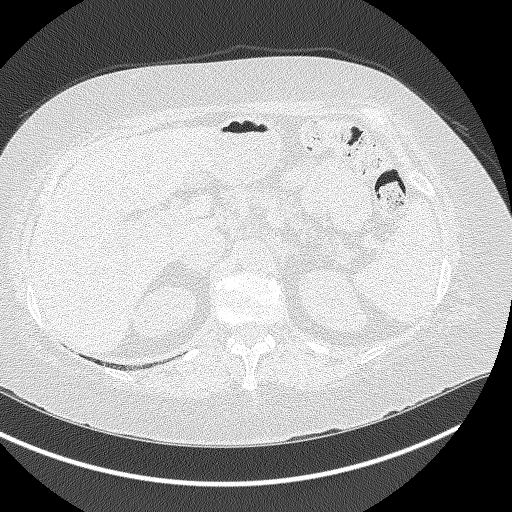
[im 73/320  lung]
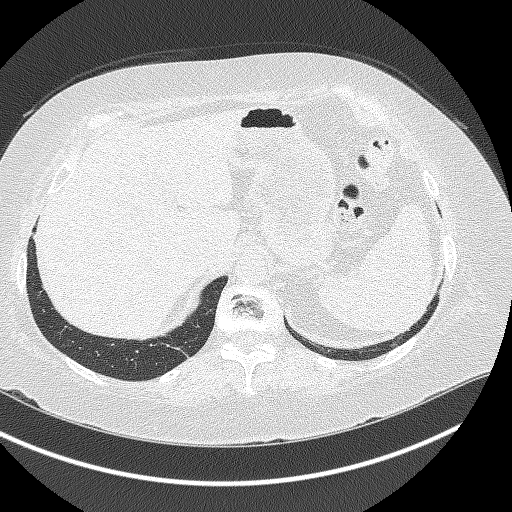
[im 102/320  lung]
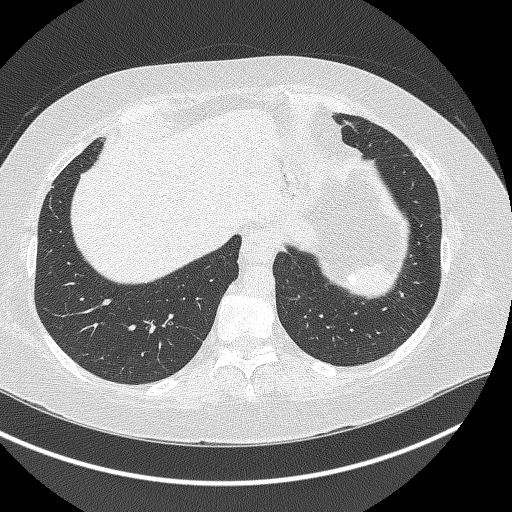
[im 117/320  mediastinal]
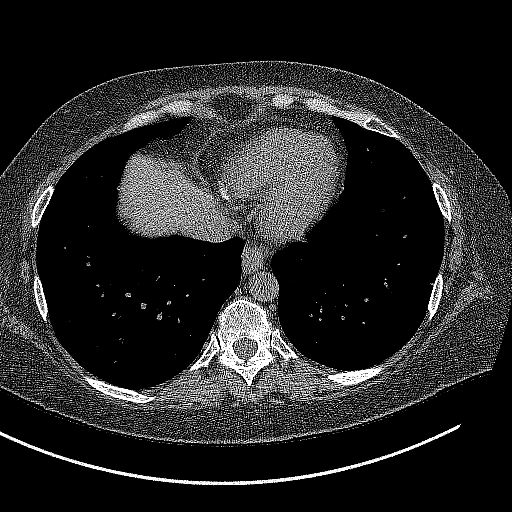
[im 117/320  lung]
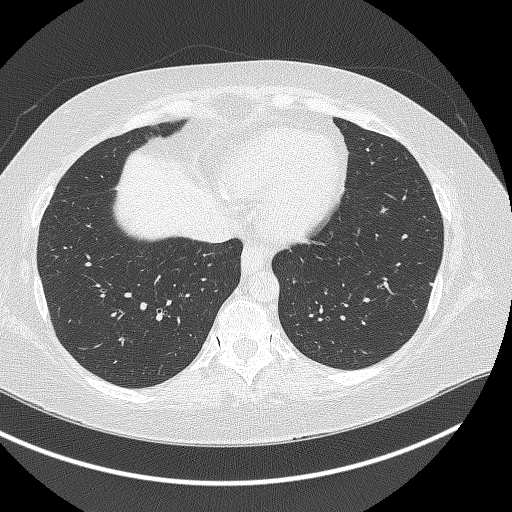
[im 146/320  lung]
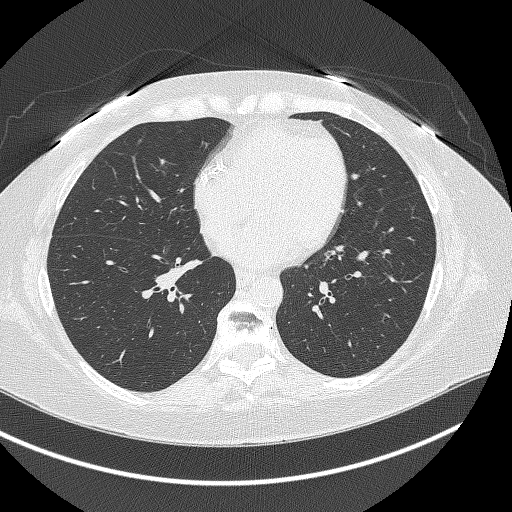
[im 175/320  lung]
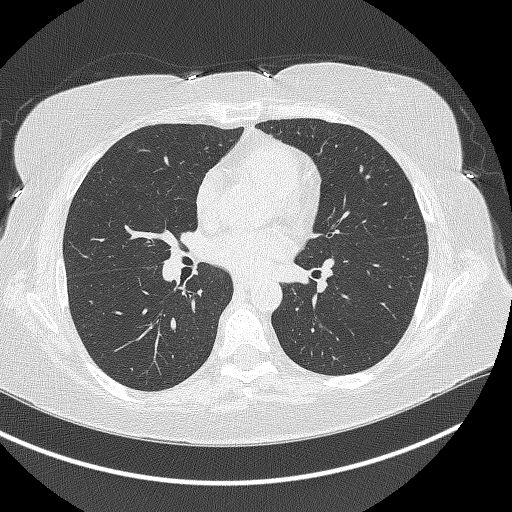
[im 204/320  lung]
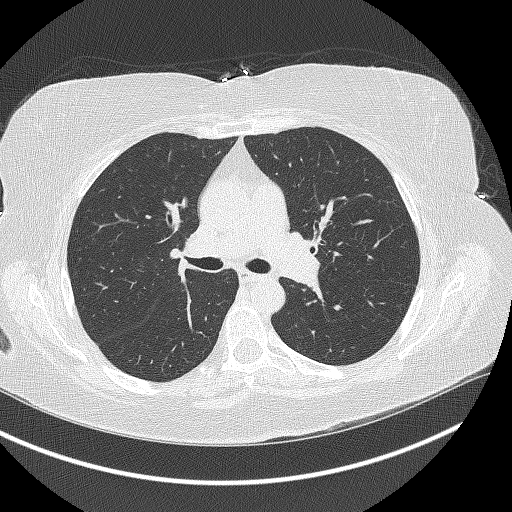
[im 218/320  mediastinal]
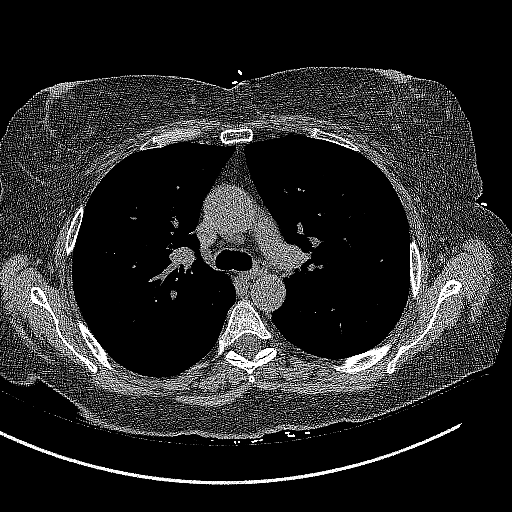
[im 218/320  lung]
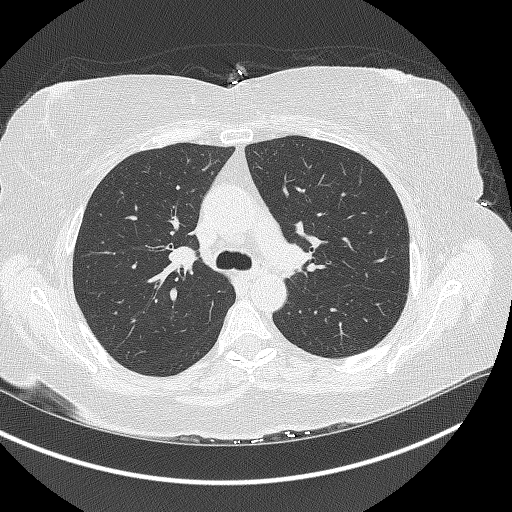
[im 247/320  lung]
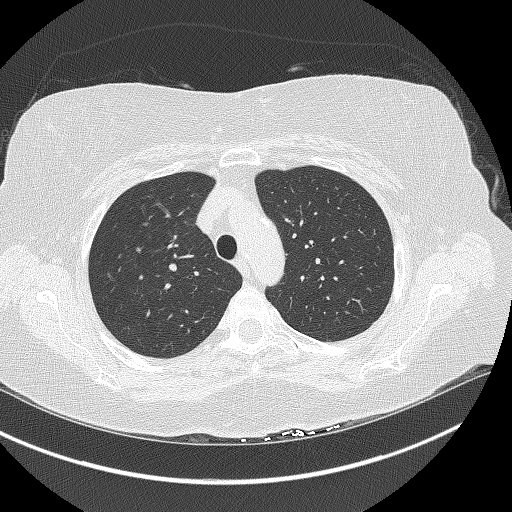
[im 276/320  lung]
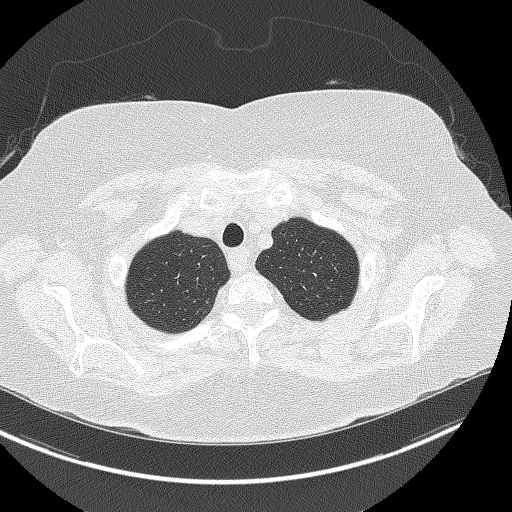
[im 305/320  lung]
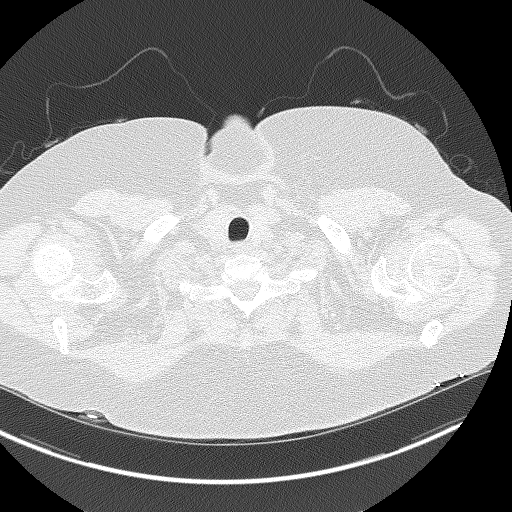

[Series 5: coronals lung 1.00 cor · coronal · 0.63mm/px · 3 of 333 slices shown]
[im 67/333  lung]
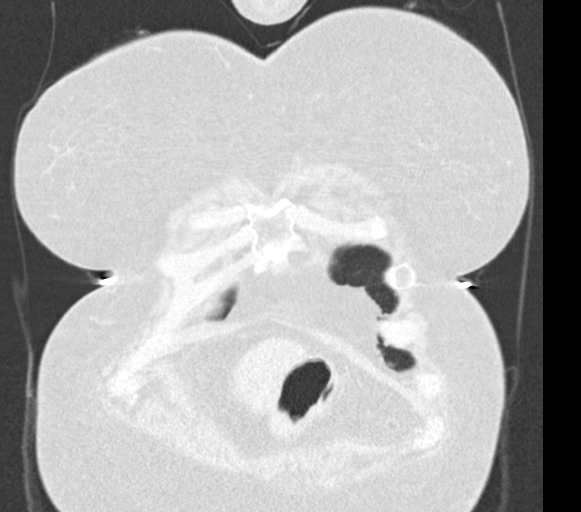
[im 133/333  lung]
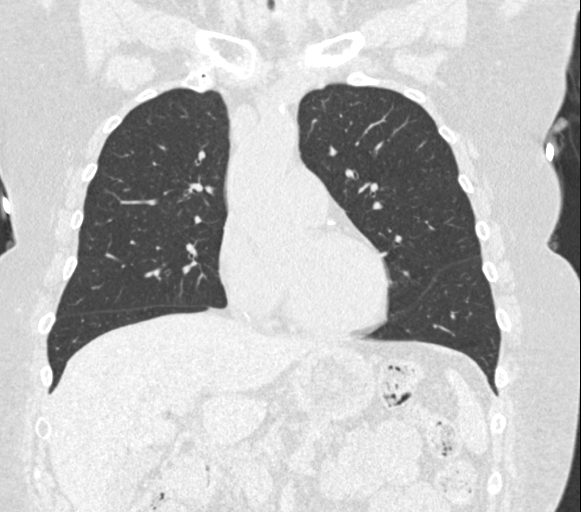
[im 200/333  lung]
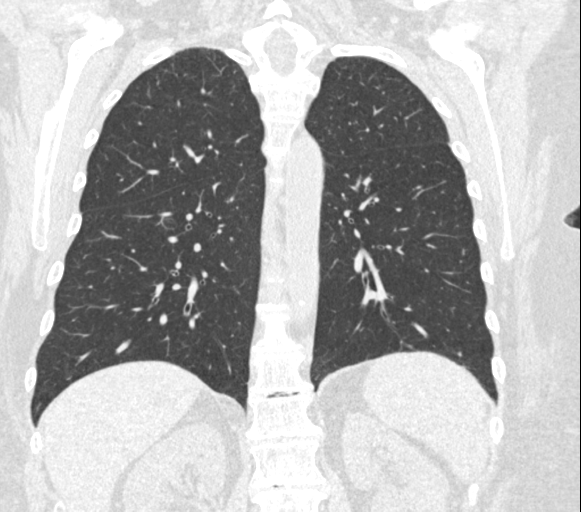

[15 of 40 positions shown; findings below may reference images not displayed]

FINDINGS: Cardiovascular: The heart size is normal. No substantial pericardial
effusion. Coronary artery calcification is evident. Mild
atherosclerotic calcification is noted in the wall of the thoracic
aorta.

Mediastinum/Nodes: No mediastinal lymphadenopathy. No evidence for
gross hilar lymphadenopathy although assessment is limited by the
lack of intravenous contrast on the current study. The esophagus has
normal imaging features. There is no axillary lymphadenopathy.

Lungs/Pleura: Centrilobular emphsyema noted. 3 mm left lower lobe
nodule evident. No suspicious pulmonary nodule or mass. No focal
airspace consolidation. No pleural effusion.

Upper Abdomen: Unremarkable.

Musculoskeletal: No worrisome lytic or sclerotic osseous
abnormality.
IMPRESSION: 1. Lung-RADS 2, benign appearance or behavior. Continue annual
screening with low-dose chest CT without contrast in 12 months.
2. Aortic Atherosclerosis (VMJT4-0G4.4) and Emphysema (VMJT4-TWM.J).

## 2024-03-27 ENCOUNTER — Other Ambulatory Visit: Payer: Self-pay | Admitting: Family

## 2024-03-27 DIAGNOSIS — I252 Old myocardial infarction: Secondary | ICD-10-CM

## 2024-03-27 DIAGNOSIS — I255 Ischemic cardiomyopathy: Secondary | ICD-10-CM

## 2024-03-28 ENCOUNTER — Ambulatory Visit
Admission: RE | Admit: 2024-03-28 | Discharge: 2024-03-28 | Disposition: A | Discharge status: Home / Self Care | Source: Ambulatory Visit | Attending: Family | Admitting: Family

## 2024-03-28 DIAGNOSIS — Z1231 Encounter for screening mammogram for malignant neoplasm of breast: Secondary | ICD-10-CM | POA: Diagnosis present

## 2024-04-01 ENCOUNTER — Ambulatory Visit: Payer: Self-pay | Admitting: Family

## 2024-04-21 ENCOUNTER — Other Ambulatory Visit: Payer: Self-pay | Admitting: Acute Care

## 2024-04-21 DIAGNOSIS — Z87891 Personal history of nicotine dependence: Secondary | ICD-10-CM

## 2024-04-21 DIAGNOSIS — Z122 Encounter for screening for malignant neoplasm of respiratory organs: Secondary | ICD-10-CM

## 2024-04-22 ENCOUNTER — Other Ambulatory Visit: Payer: Self-pay | Admitting: Family

## 2024-04-22 DIAGNOSIS — I252 Old myocardial infarction: Secondary | ICD-10-CM

## 2024-04-22 DIAGNOSIS — E782 Mixed hyperlipidemia: Secondary | ICD-10-CM

## 2024-04-22 DIAGNOSIS — E118 Type 2 diabetes mellitus with unspecified complications: Secondary | ICD-10-CM

## 2024-04-22 DIAGNOSIS — E66811 Obesity, class 1: Secondary | ICD-10-CM

## 2024-05-10 ENCOUNTER — Ambulatory Visit: Admission: RE | Admit: 2024-05-10 | Source: Ambulatory Visit

## 2024-05-10 ENCOUNTER — Ambulatory Visit
Admission: RE | Admit: 2024-05-10 | Discharge: 2024-05-10 | Disposition: A | Discharge status: Home / Self Care | Source: Ambulatory Visit | Attending: Acute Care | Admitting: Acute Care

## 2024-05-10 DIAGNOSIS — Z87891 Personal history of nicotine dependence: Secondary | ICD-10-CM | POA: Insufficient documentation

## 2024-05-10 DIAGNOSIS — Z122 Encounter for screening for malignant neoplasm of respiratory organs: Secondary | ICD-10-CM | POA: Diagnosis present

## 2024-05-17 ENCOUNTER — Encounter: Payer: Self-pay | Admitting: Family

## 2024-05-17 DIAGNOSIS — E118 Type 2 diabetes mellitus with unspecified complications: Secondary | ICD-10-CM

## 2024-05-18 MED ORDER — SEMAGLUTIDE (1 MG/DOSE) 4 MG/3ML ~~LOC~~ SOPN: 1.0000 mg | PEN_INJECTOR | SUBCUTANEOUS | 2 refills | Status: DC

## 2024-05-18 NOTE — Addendum Note (Signed)
 Addended by: CORWIN ANTU on: 05/18/2024 12:53 PM   Modules accepted: Orders

## 2024-05-23 ENCOUNTER — Other Ambulatory Visit: Payer: Self-pay | Admitting: Acute Care

## 2024-05-23 DIAGNOSIS — Z87891 Personal history of nicotine dependence: Secondary | ICD-10-CM

## 2024-05-23 DIAGNOSIS — Z122 Encounter for screening for malignant neoplasm of respiratory organs: Secondary | ICD-10-CM

## 2024-05-24 ENCOUNTER — Encounter: Payer: Self-pay | Admitting: Family

## 2024-05-24 DIAGNOSIS — Q278 Other specified congenital malformations of peripheral vascular system: Secondary | ICD-10-CM | POA: Insufficient documentation

## 2024-05-24 DIAGNOSIS — J432 Centrilobular emphysema: Secondary | ICD-10-CM | POA: Insufficient documentation

## 2024-06-02 ENCOUNTER — Other Ambulatory Visit: Payer: Self-pay | Admitting: Cardiovascular Disease

## 2024-06-02 DIAGNOSIS — I255 Ischemic cardiomyopathy: Secondary | ICD-10-CM

## 2024-06-02 DIAGNOSIS — I252 Old myocardial infarction: Secondary | ICD-10-CM

## 2024-07-03 ENCOUNTER — Other Ambulatory Visit: Payer: Self-pay | Admitting: Family

## 2024-07-03 DIAGNOSIS — I252 Old myocardial infarction: Secondary | ICD-10-CM

## 2024-07-03 DIAGNOSIS — I255 Ischemic cardiomyopathy: Secondary | ICD-10-CM

## 2024-07-20 ENCOUNTER — Other Ambulatory Visit: Payer: Self-pay | Admitting: Cardiovascular Disease

## 2024-08-10 ENCOUNTER — Encounter: Payer: Self-pay | Admitting: Family

## 2024-08-10 ENCOUNTER — Ambulatory Visit: Admitting: Family

## 2024-08-10 VITALS — BP 112/68 | HR 70 | Temp 98.0°F | Ht 65.0 in | Wt 174.0 lb

## 2024-08-10 DIAGNOSIS — E782 Mixed hyperlipidemia: Secondary | ICD-10-CM | POA: Diagnosis not present

## 2024-08-10 DIAGNOSIS — E663 Overweight: Secondary | ICD-10-CM | POA: Diagnosis not present

## 2024-08-10 DIAGNOSIS — E118 Type 2 diabetes mellitus with unspecified complications: Secondary | ICD-10-CM

## 2024-08-10 DIAGNOSIS — T730XXA Starvation, initial encounter: Secondary | ICD-10-CM

## 2024-08-10 LAB — LIPID PANEL
Cholesterol: 124 mg/dL (ref 0–200)
HDL: 60.8 mg/dL (ref >39.00)
LDL Cholesterol: 54 mg/dL (ref 0–99)
NonHDL: 63.49
Total CHOL/HDL Ratio: 2
Triglycerides: 46 mg/dL (ref 0.0–149.0)
VLDL: 9.2 mg/dL (ref 0.0–40.0)

## 2024-08-10 LAB — COMPREHENSIVE METABOLIC PANEL WITH GFR
ALT: 25 U/L (ref 0–35)
AST: 30 U/L (ref 0–37)
Albumin: 4 g/dL (ref 3.5–5.2)
Alkaline Phosphatase: 63 U/L (ref 39–117)
BUN: 18 mg/dL (ref 6–23)
CO2: 33 meq/L — ABNORMAL HIGH (ref 19–32)
Calcium: 9.1 mg/dL (ref 8.4–10.5)
Chloride: 101 meq/L (ref 96–112)
Creatinine, Ser: 0.71 mg/dL (ref 0.40–1.20)
GFR: 88.04 mL/min (ref >60.00)
Glucose, Bld: 102 mg/dL — ABNORMAL HIGH (ref 70–99)
Potassium: 4.2 meq/L (ref 3.5–5.1)
Sodium: 138 meq/L (ref 135–145)
Total Bilirubin: 0.8 mg/dL (ref 0.2–1.2)
Total Protein: 6.3 g/dL (ref 6.0–8.3)

## 2024-08-10 LAB — TSH: TSH: 1.94 u[IU]/mL (ref 0.35–5.50)

## 2024-08-10 LAB — HEMOGLOBIN A1C: Hgb A1c MFr Bld: 6.2 % (ref 4.6–6.5)

## 2024-08-10 NOTE — Progress Notes (Signed)
 Established Patient Office Visit  Subjective:      CC:  Chief Complaint  Patient presents with   Medical Management of Chronic Issues    HPI: Karen Hartman is a 68 y.o. female presenting on 08/10/2024 for Medical Management of Chronic Issues .  Discussed the use of AI scribe software for clinical note transcription with the patient, who gave verbal consent to proceed.  History of Present Illness Karen Hartman is a 68 year old female who presents for a follow-up visit after cataract surgery and retinal tear.  She reports that her cataract surgery and retinal tear recovery have gone as her eye doctor described, with only rare occasions of noticing a 'floaty' from the piece that broke off. Occasionally, she notices a 'floaty' from the piece that broke off, but it is rare and does not significantly impact her vision. She follows with ophthalmology regularly.   She is currently on Ozempic  at a dose of 1 mg. She previously experienced nausea when attempting to increase the dose two years ago but is now tolerating the current dose well. Despite not feeling physically hungry, she engages in 'comfort eating' and consuming non-nutritious foods, which she attributes to her mental state rather than physical hunger.  She has a history of diabetes and is concerned about her A1c levels potentially increasing since her last visit. She understands the importance of managing her diet to prevent weight gain and maintain her current health status. She has lost approximately 150 pounds over several years and is keen to avoid regaining weight.  Her evening routine involves completing chores before eating, often leading to eating out of habit rather than hunger. She sometimes eats what she has prepared even if she is not hungry after walking her dog.         Social history:  Relevant past medical, surgical, family and social history reviewed and updated as indicated. Interim medical  history since our last visit reviewed.  Allergies and medications reviewed and updated.  DATA REVIEWED: CHART IN EPIC     ROS: Negative unless specifically indicated above in HPI.    Current Outpatient Medications:    aspirin  EC (ASPIRIN  LOW DOSE) 81 MG tablet, Take 1 tablet (81 mg total) by mouth daily., Disp: 90 tablet, Rfl: 1   atorvastatin  (LIPITOR ) 20 MG tablet, TAKE 1 TABLET BY MOUTH EVERY DAY, Disp: 90 tablet, Rfl: 3   cholecalciferol (VITAMIN D3) 25 MCG (1000 UNIT) tablet, Take 1,000 Units by mouth daily., Disp: , Rfl:    clobetasol ointment (TEMOVATE) 0.05 %, Apply 1 application topically as needed. , Disp: , Rfl:    docusate sodium  (COLACE) 100 MG capsule, Take 100 mg by mouth daily., Disp: , Rfl:    ezetimibe  (ZETIA ) 10 MG tablet, TAKE 1 TABLET BY MOUTH EVERY DAY, Disp: 90 tablet, Rfl: 0   lisinopril  (ZESTRIL ) 5 MG tablet, TAKE 1 TABLET (5 MG TOTAL) BY MOUTH DAILY., Disp: 90 tablet, Rfl: 3   metoprolol  succinate (TOPROL -XL) 25 MG 24 hr tablet, TAKE 0.5 TABLETS BY MOUTH AT BEDTIME., Disp: 45 tablet, Rfl: 3   nitroGLYCERIN  (NITROSTAT ) 0.4 MG SL tablet, Place 1 tablet (0.4 mg total) under the tongue every 5 (five) minutes as needed for chest pain (up to 3 doses)., Disp: 25 tablet, Rfl: 3   Semaglutide , 1 MG/DOSE, 4 MG/3ML SOPN, Inject 1 mg as directed once a week., Disp: 3 mL, Rfl: 2        Objective:  BP 112/68 (BP Location: Left Arm, Patient Position: Sitting, Cuff Size: Large)   Pulse 70   Temp 98 F (36.7 C) (Temporal)   Ht 5' 5 (1.651 m)   Wt 174 lb (78.9 kg)   SpO2 97%   BMI 28.96 kg/m   Physical Exam   Wt Readings from Last 3 Encounters:  08/10/24 174 lb (78.9 kg)  02/03/24 174 lb (78.9 kg)  07/28/23 172 lb 6.4 oz (78.2 kg)    Physical Exam Constitutional:      General: She is not in acute distress.    Appearance: Normal appearance. She is normal weight. She is not ill-appearing, toxic-appearing or diaphoretic.  HENT:     Head:  Normocephalic.  Cardiovascular:     Rate and Rhythm: Normal rate and regular rhythm.  Pulmonary:     Effort: Pulmonary effort is normal.     Breath sounds: Normal breath sounds.  Musculoskeletal:        General: Normal range of motion.  Neurological:     General: No focal deficit present.     Mental Status: She is alert and oriented to person, place, and time. Mental status is at baseline.  Psychiatric:        Mood and Affect: Mood normal.        Behavior: Behavior normal.        Thought Content: Thought content normal.        Judgment: Judgment normal.          Results   Assessment & Plan:   Assessment and Plan Assessment & Plan Type 2 diabetes mellitus Concern for potential increase in A1c since last visit. Emphasis on lifestyle modification to prevent further increase in A1c and avoid medication escalation. Importance of addressing disordered eating behaviors to manage diabetes effectively. - Order blood tests to check A1c, cholesterol, and metabolic panel including liver and kidney function - Refer to behavioral health for therapy to address disordered eating behaviors - Consider referral to a diabetic educator in the future if needed  Obesity with disordered eating behaviors Characterized by comfort eating and poor dietary choices despite lack of hunger. Psychological and behavioral aspects contributing to eating habits discussed. Encouragement to explore therapy to address underlying issues and improve eating behaviors. Potential benefits of therapy and behavioral techniques such as mindful eating, distraction techniques, and dietary adjustments discussed. Encouraged to consider dietary changes including increasing protein and fiber intake to promote satiety. - Refer to behavioral health for therapy to address disordered eating behaviors - Encouraged to consider dietary changes including increasing protein and fiber intake to promote satiety  History of cataract surgery  and retinal tear with operculated hole Currently well-managed with rare visual disturbances. No indication for further surgical intervention unless symptoms worsen. cont f/u with ophthalmology as scheduled.   General Health Maintenance Emphasis on lifestyle modifications to improve overall health and prevent complications in the context of managing diabetes and obesity.        Return in about 6 months (around 02/08/2025) for f/u CPE.     Ginger Patrick, MSN, APRN, FNP-C [facility]

## 2024-08-17 ENCOUNTER — Ambulatory Visit: Payer: Self-pay | Admitting: Family

## 2024-08-22 NOTE — Progress Notes (Unsigned)
 Cardiology Office Note  Date:  08/23/2024   ID:  Scout, Guyett 1956-08-24, MRN [id number]  PCP:  Corwin Antu, FNP   Chief Complaint  Patient presents with   12 month follow up     Doing well.     HPI:  Karen Hartman is a 68 y.o. female with history of  DM (previously treated with weight loss), tobacco abuse, stopped December 2017 vulvar cancer s/p  surgery, lymph node resection CAD with inf-lat STEMI 10/2016 s/p PCI to RCA  Ejection fraction 50 to 55% by echo January 2018 who presents for f/u of her coronary artery disease  Last seen by myself in clinic 7/24 Reports Ozempic  dose recently increased Previously on 0.5, now on 1 mg A1c trending higher  Troubled by lower extremity swelling Likes wearing compression hose but finds the swelling bothersome Unclear if symptoms secondary to lymph node resection following vulvar surgery  Blood pressure low, asymptomatic  Denies chest pain or shortness of breath on exertion, active at baseline  Lab work reviewed A1C 6.2 total chol 124, LDL 54  EKG personally reviewed by myself on todays visit EKG Interpretation Date/Time:  Tuesday August 23 2024 16:21:57 EDT Ventricular Rate:  80 PR Interval:  136 QRS Duration:  82 QT Interval:  372 QTC Calculation: 429 R Axis:   20  Text Interpretation: Normal sinus rhythm Normal ECG When compared with ECG of 19-May-2023 08:20, No significant change was found Confirmed by Perla Lye ([id number]) on 08/23/2024 4:51:41 PM    Past medical history reviewed Stop smoking December 2017 Smoked for 40 years   echocardiogram January 2018 showing ejection fraction 50 to 55%  ST elevation , stent to the RCA, she had recurrent ST elevation concerning for acute stent thrombosis.   treated with Aggrestat and taken back to the cath lab urgently which showed acute stent thrombosis which was treated successfully with aspiration thrombectomy, angiojet thrombectomy, and PTCA reloaded with  an additional 180mg  of Brilinta , continued on Aggrestat for 18 hours. felt this event possibly happened due to her delay in digesting Brilinta .   troponin peaked at 29.   2D echo 11/02/16 showed EF 30-35%, grade 1 DD, mild MR, moderately dilated RV, moderate RV dysfunction, akinesis of the basal and mid inferoseptal,  inferior, inferolateral walls, and apical inferior and septal walls.    runs of NSVT as well as Torsades (18 seconds) on telemetry.    treated with heparin , amiodarone  and lidocaine .   LifeVest was recommended with repeat echo 30 days - if EF >35 and no shocks, could discontinue Lifevest at that time.    PMH:   has a past medical history of Acute ST elevation myocardial infarction (STEMI) (HCC) (11/01/2016), CAD in native artery, Diabetes (HCC), HPV (human papilloma virus) infection (10/20/2017), Hyperlipidemia, mixed (11/01/2016), Ischemic cardiomyopathy, Morbid obesity (HCC), NSVT (nonsustained ventricular tachycardia) (HCC), NSVT (nonsustained ventricular tachycardia) (HCC) (11/05/2016), STEMI involving right coronary artery (HCC) (11/01/2016), Tobacco abuse, Torsades de pointes (HCC), Torsades de pointes (HCC), and Vulvar cancer (HCC).  PSH:    Past Surgical History:  Procedure Laterality Date   CARDIAC CATHETERIZATION N/A 11/01/2016   Procedure: Left Heart Cath and Coronary Angiography;  Surgeon: Ozell Fell, MD;  Location: [facility] INVASIVE CV LAB;  Service: Cardiovascular;  Laterality: N/A;   CARDIAC CATHETERIZATION N/A 11/01/2016   Procedure: Coronary Stent Intervention;  Surgeon: Ozell Fell, MD;  Location: [facility] INVASIVE CV LAB;  Service: Cardiovascular;  Laterality: N/A;  DES to Prox RCA- Promus 3.5x28  CATARACT EXTRACTION W/PHACO Right 05/27/2023   Procedure: CATARACT EXTRACTION PHACO AND INTRAOCULAR LENS PLACEMENT (IOC) RIGHT DIABETIC 7.38 00:44.6;  Surgeon: Mittie Gaskin, MD;  Location: [facility];  Service: Ophthalmology;  Laterality: Right;   CATARACT  EXTRACTION W/PHACO Left 06/17/2023   Procedure: CATARACT EXTRACTION PHACO AND INTRAOCULAR LENS PLACEMENT (IOC) LEFT DIABETIC 4.08 00:32.1;  Surgeon: Mittie Gaskin, MD;  Location: [facility];  Service: Ophthalmology;  Laterality: Left;   CESAREAN SECTION     CORONARY THROMBECTOMY N/A 11/01/2016   Procedure: Coronary Thrombectomy;  Surgeon: Ozell Fell, MD;  Location: [facility] INVASIVE CV LAB;  Service: Cardiovascular;  Laterality: N/A;   CORONARY ULTRASOUND/IVUS N/A 11/01/2016   Procedure: Intravascular Ultrasound/IVUS;  Surgeon: Ozell Fell, MD;  Location: [facility] INVASIVE CV LAB;  Service: Cardiovascular;  Laterality: N/A;   LAMINECTOMY  1986   LASER ABLATION  11/25/2022   LEFT HEART CATH AND CORONARY ANGIOGRAPHY N/A 11/01/2016   Procedure: Left Heart Cath and Coronary Angiography;  Surgeon: Ozell Fell, MD;  Location: [facility] INVASIVE CV LAB;  Service: Cardiovascular;  Laterality: N/A;   TEMPORARY PACEMAKER N/A 11/01/2016   Procedure: Temporary Pacemaker;  Surgeon: Ozell Fell, MD;  Location: [facility] INVASIVE CV LAB;  Service: Cardiovascular;  Laterality: N/A;   TONSILLECTOMY  1981   VULVECTOMY Right 2016   WISDOM TOOTH EXTRACTION      Current Outpatient Medications  Medication Sig Dispense Refill   aspirin  EC (ASPIRIN  LOW DOSE) 81 MG tablet Take 1 tablet (81 mg total) by mouth daily. 90 tablet 1   atorvastatin  (LIPITOR ) 20 MG tablet TAKE 1 TABLET BY MOUTH EVERY DAY 90 tablet 3   cholecalciferol (VITAMIN D3) 25 MCG (1000 UNIT) tablet Take 1,000 Units by mouth daily.     clobetasol ointment (TEMOVATE) 0.05 % Apply 1 application topically as needed.      docusate sodium  (COLACE) 100 MG capsule Take 100 mg by mouth daily.     ezetimibe  (ZETIA ) 10 MG tablet TAKE 1 TABLET BY MOUTH EVERY DAY 90 tablet 0   lisinopril  (ZESTRIL ) 5 MG tablet TAKE 1 TABLET (5 MG TOTAL) BY MOUTH DAILY. 90 tablet 3   metoprolol  succinate (TOPROL -XL) 25 MG 24 hr tablet TAKE 0.5 TABLETS BY MOUTH AT BEDTIME. 45  tablet 3   nitroGLYCERIN  (NITROSTAT ) 0.4 MG SL tablet Place 1 tablet (0.4 mg total) under the tongue every 5 (five) minutes as needed for chest pain (up to 3 doses). 25 tablet 3   Semaglutide , 1 MG/DOSE, 4 MG/3ML SOPN Inject 1 mg as directed once a week. 3 mL 2   No current facility-administered medications for this visit.    Allergies:   Patient has no known allergies.   Social History:  The patient  reports that she quit smoking about 8 years ago. Her smoking use included cigarettes. She started smoking about 48 years ago. She has a 30 pack-year smoking history. She has never used smokeless tobacco. She reports that she does not drink alcohol and does not use drugs.   Family History:   family history includes AAA (abdominal aortic aneurysm) in her father; Heart disease in her mother; Uterine cancer in her mother.    Review of Systems: Review of Systems  Constitutional:  Positive for weight loss.  Respiratory: Negative.    Cardiovascular: Negative.   Gastrointestinal: Negative.   Musculoskeletal: Negative.   Neurological: Negative.   Psychiatric/Behavioral: Negative.    All other systems reviewed and are negative.   PHYSICAL EXAM: VS:  BP (!) 100/54 (BP Location:  Left Arm, Patient Position: Sitting, Cuff Size: Normal)   Pulse 80   Ht 5' 5 (1.651 m)   Wt 177 lb 2 oz (80.3 kg)   SpO2 97%   BMI 29.48 kg/m  , BMI Body mass index is 29.48 kg/m. Constitutional:  oriented to person, place, and time. No distress.  HENT:  Head: Grossly normal Eyes:  no discharge. No scleral icterus.  Neck: No JVD, no carotid bruits  Cardiovascular: Regular rate and rhythm, no murmurs appreciated, trace lower extremity edema, minimal pitting Pulmonary/Chest: Clear to auscultation bilaterally, no wheezes or rales Abdominal: Soft.  no distension.  no tenderness.  Musculoskeletal: Normal range of motion Neurological:  normal muscle tone. Coordination normal. No atrophy Skin: Skin warm and  dry Psychiatric: normal affect, pleasant  Recent Labs: 08/10/2024: ALT 25; BUN 18; Creatinine, Ser 0.71; Potassium 4.2; Sodium 138; TSH 1.94    Lipid Panel Lab Results  Component Value Date   CHOL 124 08/10/2024   HDL 60.80 08/10/2024   LDLCALC 54 08/10/2024   TRIG 46.0 08/10/2024      Wt Readings from Last 3 Encounters:  08/23/24 177 lb 2 oz (80.3 kg)  08/10/24 174 lb (78.9 kg)  02/03/24 174 lb (78.9 kg)     ASSESSMENT AND PLAN:  Cardiomyopathy, ischemic -  ejection fraction  50-55% in January 2018 Leg swelling, wears compression hose Feels the swelling waxes and wanes Possibly exacerbated by lymph node resection following vulvar cancer surgery - Suggested she could try Lasix 20 mg daily as needed with potassium 10 as needed several days a week to see if this helps - Continue compression hose, leg elevation  NSVT (nonsustained ventricular tachycardia) (HCC) After COVID had some palpitations, Tolerating metoprolol  succinate 12.5 daily  Hyperlipidemia, mixed Cholesterol is at goal on the current lipid regimen. No changes to the medications were made.  CAD in native artery Currently with no symptoms of angina. No further workup at this time. Continue current medication regimen.  Type 2 diabetes mellitus with other circulatory complication, without long-term current use of insulin  (HCC) A1c trending higher, Ozempic  recently increased  Essential hypertension Blood pressure low Recommend if blood pressure runs low on Lasix as needed may need to hold lisinopril  and metoprolol  for day or 2    Orders Placed This Encounter  Procedures   EKG 12-Lead     Signed, Velinda Lunger, M.D., Ph.D. 08/23/2024  [facility] [place], [place] [phone]

## 2024-08-23 ENCOUNTER — Encounter: Payer: Self-pay | Admitting: Cardiovascular Disease

## 2024-08-23 ENCOUNTER — Ambulatory Visit: Attending: Cardiovascular Disease | Admitting: Cardiovascular Disease

## 2024-08-23 VITALS — BP 100/54 | HR 80 | Ht 65.0 in | Wt 177.1 lb

## 2024-08-23 DIAGNOSIS — I4729 Other ventricular tachycardia: Secondary | ICD-10-CM

## 2024-08-23 DIAGNOSIS — E1159 Type 2 diabetes mellitus with other circulatory complications: Secondary | ICD-10-CM | POA: Diagnosis not present

## 2024-08-23 DIAGNOSIS — E782 Mixed hyperlipidemia: Secondary | ICD-10-CM | POA: Diagnosis not present

## 2024-08-23 DIAGNOSIS — I25118 Atherosclerotic heart disease of native coronary artery with other forms of angina pectoris: Secondary | ICD-10-CM

## 2024-08-23 DIAGNOSIS — I255 Ischemic cardiomyopathy: Secondary | ICD-10-CM | POA: Diagnosis not present

## 2024-08-23 DIAGNOSIS — Z720 Tobacco use: Secondary | ICD-10-CM

## 2024-08-23 DIAGNOSIS — Z79899 Other long term (current) drug therapy: Secondary | ICD-10-CM

## 2024-08-23 MED ORDER — FUROSEMIDE 20 MG PO TABS: 20.0000 mg | ORAL_TABLET | Freq: Every day | ORAL | 1 refills | Status: AC | PRN

## 2024-08-23 MED ORDER — POTASSIUM CHLORIDE CRYS ER 10 MEQ PO TBCR: 10.0000 meq | EXTENDED_RELEASE_TABLET | Freq: Every day | ORAL | 1 refills | Status: AC | PRN

## 2024-08-23 NOTE — Patient Instructions (Addendum)
 Medication Instructions:   Lasix 20 mg daily as needed  for leg swelling Take with potassium 10 meq when you take lasix  If you need a refill on your cardiac medications before your next appointment, please call your pharmacy.   Lab work: Your provider would like for you to return in 2-4 weeks to have the following labs drawn: BMP.   Please go to [facility] [address] ([facility] [address], [place] [address] You do not need an appointment.  They are open from 8 am- 4:30 pm.  Lunch from 1:00 pm- 2:00 pm You will not need to be fasting.   Testing/Procedures: No new testing needed  Follow-Up: At [facility], you and your health needs are our priority.  As part of our continuing mission to provide you with exceptional heart care, we have created designated Provider Care Teams.  These Care Teams include your primary Cardiologist (physician) and Advanced Practice Providers (APPs -  Physician Assistants and Nurse Practitioners) who all work together to provide you with the care you need, when you need it.  You will need a follow up appointment in 12 months  Providers on your designated Care Team:   Lonni Meager, NP Bernardino Bring, PA-C [place] [place], [place]  COVID-19 Vaccine Information can be found at: [email or web address] For questions related to vaccine distribution or appointments, please email [email or web address] or call [phone].

## 2024-08-27 ENCOUNTER — Other Ambulatory Visit: Payer: Self-pay | Admitting: Family

## 2024-08-27 DIAGNOSIS — E118 Type 2 diabetes mellitus with unspecified complications: Secondary | ICD-10-CM

## 2024-09-21 ENCOUNTER — Other Ambulatory Visit: Payer: Self-pay | Admitting: Family

## 2024-09-21 DIAGNOSIS — I255 Ischemic cardiomyopathy: Secondary | ICD-10-CM

## 2024-09-21 DIAGNOSIS — I252 Old myocardial infarction: Secondary | ICD-10-CM

## 2024-10-15 ENCOUNTER — Other Ambulatory Visit: Payer: Self-pay | Admitting: Cardiovascular Disease

## 2024-10-17 ENCOUNTER — Encounter: Payer: Self-pay | Admitting: Family

## 2024-10-26 ENCOUNTER — Other Ambulatory Visit: Payer: Self-pay | Admitting: Family

## 2024-10-26 DIAGNOSIS — E782 Mixed hyperlipidemia: Secondary | ICD-10-CM

## 2024-10-26 DIAGNOSIS — I252 Old myocardial infarction: Secondary | ICD-10-CM

## 2024-12-12 ENCOUNTER — Encounter: Payer: Self-pay | Admitting: Family

## 2024-12-14 NOTE — Telephone Encounter (Signed)
 I verified in the gynecology note 11/21/24 they would like to have her get the HPV vaccination for VIN.   Set her up please for the three shot series.

## 2024-12-15 NOTE — Telephone Encounter (Signed)
Called and schedule pt for nurse visit.

## 2024-12-21 ENCOUNTER — Ambulatory Visit

## 2025-02-08 ENCOUNTER — Ambulatory Visit: Admitting: Family

## 2025-02-14 ENCOUNTER — Ambulatory Visit: Admitting: Family
